# Patient Record
Sex: Female | Born: 1945
Health system: Southern US, Community
[De-identification: ages and names within clinical notes are randomized; demographics above are authoritative.]

## PROBLEM LIST (undated history)

## (undated) DIAGNOSIS — K760 Fatty (change of) liver, not elsewhere classified: Secondary | ICD-10-CM

## (undated) DIAGNOSIS — A0472 Enterocolitis due to Clostridium difficile, not specified as recurrent: Secondary | ICD-10-CM

## (undated) DIAGNOSIS — D126 Benign neoplasm of colon, unspecified: Secondary | ICD-10-CM

## (undated) DIAGNOSIS — A048 Other specified bacterial intestinal infections: Secondary | ICD-10-CM

## (undated) DIAGNOSIS — M1990 Unspecified osteoarthritis, unspecified site: Secondary | ICD-10-CM

## (undated) DIAGNOSIS — F419 Anxiety disorder, unspecified: Secondary | ICD-10-CM

## (undated) DIAGNOSIS — K5790 Diverticulosis of intestine, part unspecified, without perforation or abscess without bleeding: Secondary | ICD-10-CM

## (undated) DIAGNOSIS — F32A Depression, unspecified: Secondary | ICD-10-CM

## (undated) DIAGNOSIS — E785 Hyperlipidemia, unspecified: Secondary | ICD-10-CM

## (undated) DIAGNOSIS — IMO0002 Reserved for concepts with insufficient information to code with codable children: Secondary | ICD-10-CM

## (undated) DIAGNOSIS — D259 Leiomyoma of uterus, unspecified: Secondary | ICD-10-CM

## (undated) DIAGNOSIS — F329 Major depressive disorder, single episode, unspecified: Secondary | ICD-10-CM

## (undated) DIAGNOSIS — K589 Irritable bowel syndrome without diarrhea: Secondary | ICD-10-CM

## (undated) DIAGNOSIS — K449 Diaphragmatic hernia without obstruction or gangrene: Secondary | ICD-10-CM

## (undated) DIAGNOSIS — K219 Gastro-esophageal reflux disease without esophagitis: Secondary | ICD-10-CM

## (undated) DIAGNOSIS — H269 Unspecified cataract: Secondary | ICD-10-CM

## (undated) HISTORY — DX: Hyperlipidemia, unspecified: E78.5

## (undated) HISTORY — PX: APPENDECTOMY: SHX54

## (undated) HISTORY — PX: TONSILLECTOMY: SUR1361

## (undated) HISTORY — PX: CATARACT EXTRACTION, BILATERAL: SHX1313

## (undated) HISTORY — PX: COLONOSCOPY: SHX174

## (undated) HISTORY — DX: Benign neoplasm of colon, unspecified: D12.6

## (undated) HISTORY — DX: Anxiety disorder, unspecified: F41.9

## (undated) HISTORY — DX: Major depressive disorder, single episode, unspecified: F32.9

## (undated) HISTORY — DX: Fatty (change of) liver, not elsewhere classified: K76.0

## (undated) HISTORY — PX: WISDOM TOOTH EXTRACTION: SHX21

## (undated) HISTORY — DX: Reserved for concepts with insufficient information to code with codable children: IMO0002

## (undated) HISTORY — DX: Enterocolitis due to Clostridium difficile, not specified as recurrent: A04.72

## (undated) HISTORY — DX: Diaphragmatic hernia without obstruction or gangrene: K44.9

## (undated) HISTORY — PX: OTHER SURGICAL HISTORY: SHX169

## (undated) HISTORY — DX: Depression, unspecified: F32.A

## (undated) HISTORY — DX: Other specified bacterial intestinal infections: A04.8

## (undated) HISTORY — DX: Unspecified cataract: H26.9

## (undated) HISTORY — DX: Irritable bowel syndrome, unspecified: K58.9

## (undated) HISTORY — DX: Leiomyoma of uterus, unspecified: D25.9

## (undated) HISTORY — DX: Gastro-esophageal reflux disease without esophagitis: K21.9

## (undated) HISTORY — PX: CATARACT EXTRACTION: SUR2

## (undated) HISTORY — PX: CHOLECYSTECTOMY: SHX55

## (undated) HISTORY — DX: Diverticulosis of intestine, part unspecified, without perforation or abscess without bleeding: K57.90

---

## 1986-06-09 DIAGNOSIS — IMO0002 Reserved for concepts with insufficient information to code with codable children: Secondary | ICD-10-CM

## 1986-06-09 HISTORY — DX: Reserved for concepts with insufficient information to code with codable children: IMO0002

## 1998-07-06 ENCOUNTER — Ambulatory Visit (HOSPITAL_COMMUNITY): Admission: RE | Admit: 1998-07-06 | Discharge: 1998-07-06 | Payer: Self-pay | Admitting: *Deleted

## 1998-12-06 ENCOUNTER — Other Ambulatory Visit: Admission: RE | Admit: 1998-12-06 | Discharge: 1998-12-06 | Payer: Self-pay | Admitting: *Deleted

## 1999-02-19 ENCOUNTER — Emergency Department (HOSPITAL_COMMUNITY): Admission: EM | Admit: 1999-02-19 | Discharge: 1999-02-20 | Payer: Self-pay | Admitting: Emergency Medicine

## 1999-07-09 ENCOUNTER — Encounter: Payer: Self-pay | Admitting: *Deleted

## 1999-07-09 ENCOUNTER — Ambulatory Visit (HOSPITAL_COMMUNITY): Admission: RE | Admit: 1999-07-09 | Discharge: 1999-07-09 | Payer: Self-pay | Admitting: *Deleted

## 1999-10-15 ENCOUNTER — Other Ambulatory Visit: Admission: RE | Admit: 1999-10-15 | Discharge: 1999-10-15 | Payer: Self-pay | Admitting: *Deleted

## 2000-07-09 ENCOUNTER — Ambulatory Visit (HOSPITAL_COMMUNITY): Admission: RE | Admit: 2000-07-09 | Discharge: 2000-07-09 | Payer: Self-pay | Admitting: *Deleted

## 2000-07-09 ENCOUNTER — Encounter: Payer: Self-pay | Admitting: *Deleted

## 2008-01-07 ENCOUNTER — Observation Stay (HOSPITAL_COMMUNITY): Admission: EM | Admit: 2008-01-07 | Discharge: 2008-01-10 | Payer: Self-pay | Admitting: Emergency Medicine

## 2008-07-05 ENCOUNTER — Ambulatory Visit: Payer: Self-pay | Admitting: Gastroenterology

## 2008-07-05 DIAGNOSIS — K625 Hemorrhage of anus and rectum: Secondary | ICD-10-CM | POA: Insufficient documentation

## 2008-07-05 DIAGNOSIS — K589 Irritable bowel syndrome without diarrhea: Secondary | ICD-10-CM | POA: Insufficient documentation

## 2008-07-06 LAB — CONVERTED CEMR LAB
Basophils Absolute: 0.1 10*3/uL (ref 0.0–0.1)
Basophils Relative: 0.6 % (ref 0.0–3.0)
Eosinophils Absolute: 0.1 10*3/uL (ref 0.0–0.7)
Eosinophils Relative: 1 % (ref 0.0–5.0)
HCT: 42.4 % (ref 36.0–46.0)
Hemoglobin: 15.1 g/dL — ABNORMAL HIGH (ref 12.0–15.0)
Lymphocytes Relative: 31 % (ref 12.0–46.0)
MCHC: 35.7 g/dL (ref 30.0–36.0)
MCV: 97.2 fL (ref 78.0–100.0)
Monocytes Absolute: 0.3 10*3/uL (ref 0.1–1.0)
Monocytes Relative: 3.7 % (ref 3.0–12.0)
Neutro Abs: 5.9 10*3/uL (ref 1.4–7.7)
Neutrophils Relative %: 63.7 % (ref 43.0–77.0)
Platelets: 311 10*3/uL (ref 150–400)
RBC: 4.36 M/uL (ref 3.87–5.11)
RDW: 12.2 % (ref 11.5–14.6)
WBC: 9.3 10*3/uL (ref 4.5–10.5)

## 2009-09-04 ENCOUNTER — Telehealth: Payer: Self-pay | Admitting: Gastroenterology

## 2009-10-11 ENCOUNTER — Encounter (INDEPENDENT_AMBULATORY_CARE_PROVIDER_SITE_OTHER): Payer: Self-pay | Admitting: *Deleted

## 2009-10-17 ENCOUNTER — Telehealth: Payer: Self-pay | Admitting: Gastroenterology

## 2009-10-25 ENCOUNTER — Encounter (INDEPENDENT_AMBULATORY_CARE_PROVIDER_SITE_OTHER): Payer: Self-pay | Admitting: *Deleted

## 2009-10-29 ENCOUNTER — Ambulatory Visit: Payer: Self-pay | Admitting: Gastroenterology

## 2009-11-06 ENCOUNTER — Ambulatory Visit: Payer: Self-pay | Admitting: Gastroenterology

## 2009-11-09 ENCOUNTER — Encounter: Payer: Self-pay | Admitting: Gastroenterology

## 2009-12-03 ENCOUNTER — Telehealth: Payer: Self-pay | Admitting: Gastroenterology

## 2009-12-24 ENCOUNTER — Telehealth: Payer: Self-pay | Admitting: Gastroenterology

## 2010-02-19 ENCOUNTER — Encounter: Payer: Self-pay | Admitting: Gastroenterology

## 2010-03-06 ENCOUNTER — Encounter: Payer: Self-pay | Admitting: Gastroenterology

## 2010-03-08 ENCOUNTER — Encounter (INDEPENDENT_AMBULATORY_CARE_PROVIDER_SITE_OTHER): Payer: Self-pay | Admitting: *Deleted

## 2010-04-04 ENCOUNTER — Telehealth (INDEPENDENT_AMBULATORY_CARE_PROVIDER_SITE_OTHER): Payer: Self-pay | Admitting: *Deleted

## 2010-04-04 ENCOUNTER — Ambulatory Visit: Payer: Self-pay | Admitting: Gastroenterology

## 2010-04-12 ENCOUNTER — Encounter (INDEPENDENT_AMBULATORY_CARE_PROVIDER_SITE_OTHER): Payer: Self-pay | Admitting: *Deleted

## 2010-04-12 ENCOUNTER — Ambulatory Visit: Payer: Self-pay | Admitting: Gastroenterology

## 2010-04-12 DIAGNOSIS — R112 Nausea with vomiting, unspecified: Secondary | ICD-10-CM | POA: Insufficient documentation

## 2010-04-12 DIAGNOSIS — R197 Diarrhea, unspecified: Secondary | ICD-10-CM | POA: Insufficient documentation

## 2010-04-15 ENCOUNTER — Ambulatory Visit: Payer: Self-pay | Admitting: Gastroenterology

## 2010-04-23 ENCOUNTER — Telehealth: Payer: Self-pay | Admitting: Gastroenterology

## 2010-06-12 ENCOUNTER — Ambulatory Visit: Admit: 2010-06-12 | Payer: Self-pay | Admitting: Gastroenterology

## 2010-07-09 NOTE — Letter (Signed)
Summary: Pre Visit Letter Revised  [facility]  [address] [place], [place] [address]   Phone: [phone]  Fax: [phone]        03/08/2010 MRN: [id number] Tara Davis [address] [place], [place]  [address]             Procedure Date:  04-17-10   Welcome to the Gastroenterology Division at [facility].    You are scheduled to see a nurse for your pre-procedure visit on 04-03-10 at 4:00p.m. on the 3rd floor at [facility], [address].  We ask that you try to arrive at our office 15 minutes prior to your appointment time to allow for check-in.  Please take a minute to review the attached form.  If you answer "Yes" to one or more of the questions on the first page, we ask that you call the person listed at your earliest opportunity.  If you answer "No" to all of the questions, please complete the rest of the form and bring it to your appointment.    Your nurse visit will consist of discussing your medical and surgical history, your immediate family medical history, and your medications.   If you are unable to list all of your medications on the form, please bring the medication bottles to your appointment and we will list them.  We will need to be aware of both prescribed and over the counter drugs.  We will need to know exact dosage information as well.    Please be prepared to read and sign documents such as consent forms, a financial agreement, and acknowledgement forms.  If necessary, and with your consent, a friend or relative is welcome to sit-in on the nurse visit with you.  Please bring your insurance card so that we may make a copy of it.  If your insurance requires a referral to see a specialist, please bring your referral form from your primary care physician.  No co-pay is required for this nurse visit.     If you cannot keep your appointment, please call [phone] to cancel or reschedule prior to your appointment date.  This allows [place]  the opportunity to schedule an appointment for another patient in need of care.    Thank you for choosing [facility] for your medical needs.  We appreciate the opportunity to care for you.  Please visit [place] at our website  to learn more about our practice.  Sincerely, The Gastroenterology Division

## 2010-07-09 NOTE — Progress Notes (Signed)
Summary: Appt sooner  Phone Note Call from Patient Call back at Home Phone [phone]   Call For: Dr Christella Hartigan Summary of Call: Nausea vomitting & indigestion, dysphagia recuring diarrhea. Can she be worked in sooner than next avail 05-14-10 Initial call taken by: Ezra Sites RN,  April 04, 2010 5:07 PM  Follow-up for Phone Call        patty, can you find a spot sooner on my schedule (double book next week if needed) Follow-up by: Rachael Fee MD,  April 05, 2010 7:55 AM  Additional Follow-up for Phone Call Additional follow up Details #1::        left message on machine to call back Chales Abrahams CMA Duncan Dull)  April 05, 2010 8:34 AM     Additional Follow-up for Phone Call Additional follow up Details #2::    Appt scheduled and pt aware Follow-up by: Chales Abrahams CMA Duncan Dull),  April 05, 2010 10:42 AM

## 2010-07-09 NOTE — Progress Notes (Signed)
Summary: Triage  Phone Note Call from Patient Call back at [phone]   Caller: Patient Call For: Dr. Christella Hartigan Reason for Call: Talk to Nurse Summary of Call: pt is having cramping and diarrhea. She is taking the fiber he recommended. Initial call taken by: Karna Christmas,  December 03, 2009 4:17 PM  Follow-up for Phone Call        has been taking Citrucel for 1 month and is still having 3-5 loose stools with cramping every morning and occasionally after eating.  She was advised to take 1 immodium every morning and will call back in a week to let us know how she responds.  She was taking Immodium with good relief but was afraid to take it everyday.  I advised her to stop only if she becomes constipated. Follow-up by: Chales Abrahams CMA Duncan Dull),  December 03, 2009 4:33 PM  Additional Follow-up for Phone Call Additional follow up Details #1::        ok, thanks Additional Follow-up by: Rachael Fee MD,  December 04, 2009 7:41 AM

## 2010-07-09 NOTE — Letter (Signed)
Summary: [facility] Instructions  [facility]  [address] [place], [place] [address]   Phone: [phone]  Fax: [phone]       Tara Davis    09-Jun-1946    MRN: [id number]        Procedure Day /Date:05/07/10 TUE     Arrival Time:730 am     Procedure Time:800 am     Location of Procedure:                    X  [facility] (4th Floor)   PREPARATION FOR COLONOSCOPY WITH MOVIPREP   Starting 5 days prior to your procedure 05/02/10 do not eat nuts, seeds, popcorn, corn, beans, peas,  salads, or any raw vegetables.  Do not take any fiber supplements (e.g. Metamucil, Citrucel, and Benefiber).  THE DAY BEFORE YOUR PROCEDURE         DATE: 05/06/10 DAY: MON  1.  Drink clear liquids the entire day-NO SOLID FOOD  2.  Do not drink anything colored red or purple.  Avoid juices with pulp.  No orange juice.  3.  Drink at least 64 oz. (8 glasses) of fluid/clear liquids during the day to prevent dehydration and help the prep work efficiently.  CLEAR LIQUIDS INCLUDE: Water Jello Ice Popsicles Tea (sugar ok, no milk/cream) Powdered fruit flavored drinks Coffee (sugar ok, no milk/cream) Gatorade Juice: apple, white grape, white cranberry  Lemonade Clear bullion, consomm, broth Carbonated beverages (any kind) Strained chicken noodle soup Hard Candy                             4.  In the morning, mix first dose of MoviPrep solution:    Empty 1 Pouch A and 1 Pouch B into the disposable container    Add lukewarm drinking water to the top line of the container. Mix to dissolve    Refrigerate (mixed solution should be used within 24 hrs)  5.  Begin drinking the prep at 5:00 p.m. The MoviPrep container is divided by 4 marks.   Every 15 minutes drink the solution down to the next mark (approximately 8 oz) until the full liter is complete.   6.  Follow completed prep with 16 oz of clear liquid of your choice (Nothing red or purple).  Continue to drink  clear liquids until bedtime.  7.  Before going to bed, mix second dose of MoviPrep solution:    Empty 1 Pouch A and 1 Pouch B into the disposable container    Add lukewarm drinking water to the top line of the container. Mix to dissolve    Refrigerate  THE DAY OF YOUR PROCEDURE      DATE: 05/07/10 DAY: TUE  Beginning at 3 a.m. (5 hours before procedure):         1. Every 15 minutes, drink the solution down to the next mark (approx 8 oz) until the full liter is complete.  2. Follow completed prep with 16 oz. of clear liquid of your choice.    3. You may drink clear liquids until 6 am (2 HOURS BEFORE PROCEDURE).   MEDICATION INSTRUCTIONS  Unless otherwise instructed, you should take regular prescription medications with a small sip of water   as early as possible the morning of your procedure.           OTHER INSTRUCTIONS  You will need a responsible adult at least 64 years of  age to accompany you and drive you home.   This person must remain in the waiting room during your procedure.  Wear loose fitting clothing that is easily removed.  Leave jewelry and other valuables at home.  However, you may wish to bring a book to read or  an iPod/MP3 player to listen to music as you wait for your procedure to start.  Remove all body piercing jewelry and leave at home.  Total time from sign-in until discharge is approximately 2-3 hours.  You should go home directly after your procedure and rest.  You can resume normal activities the  day after your procedure.  The day of your procedure you should not:   Drive   Make legal decisions   Operate machinery   Drink alcohol   Return to work  You will receive specific instructions about eating, activities and medications before you leave.    The above instructions have been reviewed and explained to me by   _______________________    I fully understand and can verbalize these instructions _____________________________  Date _________  Appended Document: Moviprep Instructions pt appt time changed to 04/15/10  pt aware and reinstructed

## 2010-07-09 NOTE — Miscellaneous (Signed)
Summary: rx  Clinical Lists Changes  Medications: Rx of NEXIUM 40 MG  CPDR (ESOMEPRAZOLE MAGNESIUM) 1 capsule each day 30 minutes before breakfast and dinner meals;  #60 x 6;  Signed;  Entered by: Rachael Fee MD;  Authorized by: Rachael Fee MD;  Method used: Electronically to [facility]  [phone]*, [address] [place], [place], [place]  [address], Ph: [phone] or [phone], Fax: [phone]    Prescriptions: NEXIUM 40 MG  CPDR (ESOMEPRAZOLE MAGNESIUM) 1 capsule each day 30 minutes before breakfast and dinner meals  #60 x 6   Entered and Authorized by:   Rachael Fee MD   Signed by:   Rachael Fee MD on 04/15/2010   Method used:   Electronically to        [facility]  [phone]* (retail)       [address]       [place], [place]  [address]       Ph: [phone] or [phone]       Fax: [phone]   RxID:   [id number]

## 2010-07-09 NOTE — Progress Notes (Signed)
Summary: nexium   Phone Note Call from Patient Call back at Home Phone [phone]   Caller: Patient Call For: Dr. Christella Hartigan Reason for Call: Talk to Nurse Summary of Call: . having a problem refilling Nexium with current dosage... [facility] on [address] Initial call taken by: Vallarie Mare,  April 23, 2010 8:14 AM  Follow-up for Phone Call        I spoke with the pt and she was advised to have the pharmacy send me a prior auth and I could send that in for her she will take care of that today Follow-up by: Chales Abrahams CMA Duncan Dull),  April 23, 2010 9:33 AM

## 2010-07-09 NOTE — Progress Notes (Signed)
Summary: vomitting & diarrhea  Phone Note Call from Patient Call back at [phone] cell   Call For: Dr Christella Hartigan Summary of Call: Struggling with vomitting and diarrhea Initial call taken by: Leanor Kail [employer],  December 24, 2009 1:26 PM  Follow-up for Phone Call         complains that she still has diarrhea but has not taken her Immodium.  It does work when she takes it but she says she doesnt like to take it everyday.  I advised her to to take the Immodium as Dr Christella Hartigan advised.  She said she would take it. pt had to take Z pak for bronchial infection had some vomitting last two days.  She has finished the Z pak.  I advised her to stay on clear liquids until the vomitting subsided and I will forward to Dr Christella Hartigan. Follow-up by: Chales Abrahams CMA Duncan Dull),  December 24, 2009 1:57 PM  Additional Follow-up for Phone Call Additional follow up Details #1::        i agree, if the immodium works...she should take it Additional Follow-up by: Rachael Fee MD,  December 24, 2009 3:26 PM

## 2010-07-09 NOTE — Procedures (Signed)
Summary: Upper Endoscopy  Patient: [name] Note: All result statuses are Final unless otherwise noted.  Tests: (1) Upper Endoscopy (EGD)   EGD Upper Endoscopy       DONE     [facility]     [address]     [place], [place]  [address]           ENDOSCOPY PROCEDURE REPORT           PATIENT:  Tara Davis, Tara Davis  MR#:  [id number]     BIRTHDATE:  March 29, 1946, 64 yrs. old  GENDER:  female     ENDOSCOPIST:  Rachael Fee, MD     PROCEDURE DATE:  04/15/2010     PROCEDURE:  EGD with biopsy, [address]     ASA CLASS:  Class II     INDICATIONS:  nausea, itermittent vomiting     MEDICATIONS:  There was residual sedation effect present from     prior procedure., Versed 2 mg IV     TOPICAL ANESTHETIC:  Exactacain Spray           DESCRIPTION OF PROCEDURE:   After the risks benefits and     alternatives of the procedure were thoroughly explained, informed     consent was obtained.  The LB GIF-H180 [id number] endoscope was     introduced through the mouth and advanced to the second portion of     the duodenum, without limitations.  The instrument was slowly     withdrawn as the mucosa was fully examined.     <<PROCEDUREIMAGES>>     There was moderate appearing, non-specific gastritis. Biopsies     taken to check for H. pylori (see image7).  A hiatal hernia was     found. This was 3-4cm (see image6 and image8).  The distal     esophagus mucosa was abnormal. Severely inflamed, somewhat     edematous. There was no clearly neoplastic appearing mucosa,     however biopsies were taken to be certain.  Likely this is reflux     related (see image2 and image1).  Otherwise the examination was     normal (see image3, image4, and image5).    Retroflexed views     revealed no abnormalities.    The scope was then withdrawn from     the patient and the procedure completed.           COMPLICATIONS:  None           ENDOSCOPIC IMPRESSION:     1) Moderate appearing gastritis, biopsied to check for H.  pylori           2) 3-4cm hiatal hernia     3) Abnormal esophageal mucosa; appears reflux related but     biopsies taken to check for neoplasia     4) Otherwise normal examination           RECOMMENDATIONS:     A prescription for nexium was called in to your pharmacy. Start     taking one pill twice daily for the next two months, then OK to     change to once daily.     If biopsies show H. pylori, then you will be started on the     appropriate antibiotics.  NSAIDs (ibuprofen) can cause similar     gastritis changes.           ______________________________     Rachael Fee, MD  n.     eSIGNED:   Rachael Fee at 04/15/2010 03:22 PM           [place] [place], [id number]  Note: An exclamation mark (!) indicates a result that was not dispersed into the flowsheet. Document Creation Date: 04/15/2010 3:22 PM _______________________________________________________________________  (1) Order result status: Final Collection or observation date-time: 04/15/2010 15:11 Requested date-time:  Receipt date-time:  Reported date-time:  Referring Physician:   Ordering Physician: Rob Bunting [phone]) Specimen Source:  Source: [employer] Order Number: [id number] Lab site:

## 2010-07-09 NOTE — Procedures (Signed)
Summary: Colonoscopy  Patient: [name] Note: All result statuses are Final unless otherwise noted.  Tests: (1) Colonoscopy (COL)   COL Colonoscopy           DONE     [facility]     [address]     [place], [place]  [address]           COLONOSCOPY PROCEDURE REPORT           PATIENT:  Tara Davis, Tara Davis  MR#:  [id number]     BIRTHDATE:  11-13-1945, 63 yrs. old  GENDER:  female     ENDOSCOPIST:  Rachael Fee, MD     PROCEDURE DATE:  11/06/2009     PROCEDURE:  Colonoscopy with snare polypectomy     ASA CLASS:  Class II     INDICATIONS:  rectal bleeding, minor     MEDICATIONS:   Fentanyl 50 mcg IV, Versed 7 mg IV, Benadryl 25 mg     IV           DESCRIPTION OF PROCEDURE:   After the risks benefits and     alternatives of the procedure were thoroughly explained, informed     consent was obtained.  Digital rectal exam was performed and     revealed no rectal masses.   The LB CF-H180AL [id number] endoscope     was introduced through the anus and advanced to the cecum, which     was identified by both the appendix and ileocecal valve, without     limitations.  The quality of the prep was good, using MoviPrep.     The instrument was then slowly withdrawn as the colon was fully     examined.     <<PROCEDUREIMAGES>>           FINDINGS:  There were six sessile polyps. All were removed, 4 of     them were retrieved and sent to pathology. These ranged in size     from 5mm to 15mm and they were located in asending and descending     colon. Four of them required snare/cautery and the others cold     snare (see image4, image7, image8, image9, image10, and image13).     The largest polyp, 15mm, was in ascending colon and it required     piecemeal resection.  External hemorrhoids were found.  Mild     diverticulosis was found in the sigmoid to descending colon     segments.  This was otherwise a normal examination of the colon     (see image14, image1, and image2).   Retroflexed  views in the     rectum revealed no abnormalities.    The scope was then withdrawn     from the patient and the procedure completed.           COMPLICATIONS:  None     ENDOSCOPIC IMPRESSION:     1) Six sessile polyps, all removed; Four were retrieved and sent     to patholog.  One required piecemeal resection.     2) External hemorrhoids     3) Mild diverticulosis in the sigmoid to descending colon     segments     4) Otherwise normal examination           RECOMMENDATIONS:     1) If the polyp(s) removed today are proven to be adenomatous     (pre-cancerous) polyps, you will need a colonoscopy in 6 months  given piecemeal resection of largest polyp.     2) You will receive a letter within 1-2 weeks with the results     of your biopsy as well as final recommendations. Please call my     office if you have not received a letter after 3 weeks.           ______________________________     Rachael Fee, MD           n.     eSIGNED:   Rachael Fee at 11/06/2009 02:45 PM           [facility], [id number]  Note: An exclamation mark (!) indicates a result that was not dispersed into the flowsheet. Document Creation Date: 11/06/2009 2:46 PM _______________________________________________________________________  (1) Order result status: Final Collection or observation date-time: 11/06/2009 14:38 Requested date-time:  Receipt date-time:  Reported date-time:  Referring Physician:   Ordering Physician: Rob Bunting [phone]) Specimen Source:  Source: Launa Grill Order Number: [id number] Lab site:   Appended Document: Colonoscopy recall     Procedures Next Due Date:    Colonoscopy: 04/2010

## 2010-07-09 NOTE — Letter (Signed)
Summary: Previsit letter  [facility]  [address] [place], [place] [address]   Phone: [phone]  Fax: [phone]       10/11/2009 MRN: [id number]  Tara Davis [address] [place], [place]  [address]  Dear Ms. [name],  Welcome to the Gastroenterology Division at [facility].    You are scheduled to see a nurse for your pre-procedure visit on 10-29-09 at 4:30p.m. on the 3rd floor at [facility], [address]  We ask that you try to arrive at our office 15 minutes prior to your appointment time to allow for check-in.  Your nurse visit will consist of discussing your medical and surgical history, your immediate family medical history, and your medications.    Please bring a complete list of all your medications or, if you prefer, bring the medication bottles and we will list them.  We will need to be aware of both prescribed and over the counter drugs.  We will need to know exact dosage information as well.  If you are on blood thinners (Coumadin, Plavix, Aggrenox, Ticlid, etc.) please call our office today/prior to your appointment, as we need to consult with your physician about holding your medication.   Please be prepared to read and sign documents such as consent forms, a financial agreement, and acknowledgement forms.  If necessary, and with your consent, a friend or relative is welcome to sit-in on the nurse visit with you.  Please bring your insurance card so that we may make a copy of it.  If your insurance requires a referral to see a specialist, please bring your referral form from your primary care physician.  No co-pay is required for this nurse visit.     If you cannot keep your appointment, please call [phone] to cancel or reschedule prior to your appointment date.  This allows [place] the opportunity to schedule an appointment for another patient in need of care.    Thank you for choosing [facility] for your medical needs.   We appreciate the opportunity to care for you.  Please visit [place] at our website  to learn more about our practice.                     Sincerely.                                                                                                                   The Gastroenterology Division

## 2010-07-09 NOTE — Assessment & Plan Note (Signed)
Review of gastrointestinal problems: 1. Several adenomatous polyps 10/2009 colonoscopy (also diverticulosis, hemorhoids), largest piecemeal resected. recall at 6 months.    History of Present Illness Visit Type: Follow-up Visit Primary GI MD: Rob Bunting MD Primary Provider: Kennedy Bucker Chief Complaint: nausea, vomiting History of Present Illness:     very pleasant 65 year old woman whom I last saw at the time of a colonoscopy about 5 months ago. See those results summarized above  every day she has loose stools in the AM, lately associated with nausea/vomiting as well.  She gets flushing, sweating at those times.    gallbladder removed 12-13 years ago.  Has been taking immodium 1-2 a day every day, not really helping with the loose stools.            Current Medications (verified): 1)  Lexapro 20 Mg Tabs (Escitalopram Oxalate) .Marland Kitchen.. 1 By Mouth Once Daily 2)  Lorazepam 1 Mg Tabs (Lorazepam) .... As Needed 3)  Simvastatin 40 Mg Tabs (Simvastatin) .Marland Kitchen.. 1 By Mouth At Bedtime 4)  Imodium A-D 2 Mg Tabs (Loperamide Hcl) .Marland Kitchen.. 1-2 By Mouth Once Daily As Needed 5)  Omeprazole Magnesium 20.6 (20 Base) Mg Cpdr (Omeprazole Magnesium) .... 3-4 Times A Week As Needed 6)  Ibuprofen 800 Mg Tabs (Ibuprofen) .... As Needed  Allergies (verified): 1)  ! Codeine  Vital Signs:  Patient profile:   65 year old female Height:      64 inches Weight:      187 pounds BMI:     32.21 BSA:     1.90 Pulse rate:   78 / minute Pulse rhythm:   regular BP sitting:   120 / 78  (left arm)  Vitals Entered By: Merri Ray CMA Duncan Dull) (April 12, 2010 8:13 AM)  Physical Exam  Additional Exam:  Constitutional: generally well appearing Psychiatric: alert and oriented times 3 Abdomen: soft, non-tender, non-distended, normal bowel sounds    Impression & Recommendations:  Problem # 1:  chronic loose stools, intermittent nausea, vomiting possibly bile salt dysregulation from her  gallbladder surgery. Perhaps microscopic colitis. She is due for recall colonoscopy in the next few weeks at the same time I will proceed with random biopsies of her colon. She will try cholestyramine powder one dose nightly until then. She takes 4 ibuprofen about 3-4 times a week and perhaps this is causing some gastric irritation, peptic ulcer disease. We will proceed with EGD at the same time as her colonoscopy given her nausea, intermittent vomiting  Patient Instructions: 1)  You will be scheduled to have a colonoscopy, will plan to perform random biopsies at that time as well as reinspect the previous polyp sites. 2)  You will be scheduled to have an upper endoscopy at the same time as colonoscopy. 3)  Stop the immodium. 4)  Trial of cholestyramine 4gms at bedtime, only cut back if you become constipated. 5)  The medication list was reviewed and reconciled.  All changed / newly prescribed medications were explained.  A complete medication list was provided to the patient / caregiver. Prescriptions: CHOLESTYRAMINE   POWD (CHOLESTYRAMINE) take 4grams once nightly  #1 month x 3   Entered and Authorized by:   Rachael Fee MD   Signed by:   Rachael Fee MD on 04/12/2010   Method used:   Electronically to        [facility]  [phone]* (retail)       [phone] [facility] [address]  [place], [place]  [address]       Ph: [phone] or [phone]       Fax: [phone]   RxID:   [id number]   Appended Document:  can you send to her PCP, she is not in biscom  Appended Document: Orders Update/Movi    Clinical Lists Changes  Problems: Added new problem of DIARRHEA (ICD-787.91) Added new problem of NAUSEA WITH VOMITING (ICD-787.01) Medications: Added new medication of MOVIPREP 100 GM  SOLR (PEG-KCL-NACL-NASULF-NA ASC-C) As per prep instructions. - Signed Rx of MOVIPREP 100 GM  SOLR (PEG-KCL-NACL-NASULF-NA ASC-C) As per prep instructions.;  #1 x 0;   Signed;  Entered by: Chales Abrahams CMA (AAMA);  Authorized by: Rachael Fee MD;  Method used: Electronically to [facility]  [phone]*, [address] [place], [place], [place]  [address], Ph: [phone] or [phone], Fax: [phone] Orders: Added new Test order of Colon/Endo (Colon/Endo) - Signed    Prescriptions: MOVIPREP 100 GM  SOLR (PEG-KCL-NACL-NASULF-NA ASC-C) As per prep instructions.  #1 x 0   Entered by:   Chales Abrahams CMA (AAMA)   Authorized by:   Rachael Fee MD   Signed by:   Chales Abrahams CMA (AAMA) on 04/12/2010   Method used:   Electronically to        [facility]  [phone]* (retail)       [address]       [place], [place]  [address]       Ph: [phone] or [phone]       Fax: [phone]   RxID:   [id number]    Appended Document:  sent to transcription

## 2010-07-09 NOTE — Miscellaneous (Signed)
Summary: [facility] PV- need OV  Clinical Lists Changes   Pt complaining of nausea, vomiting at least once a week. Indigestion and dysphagia are new symptoms.  Also has diarrhea that has been going on since July.  Is scheduled for recall colon 11/9- (6 month recall).  Pt would like to be seen in office for above symptoms.  Colonoscopy for 11/9 cancelled.  First available office visit is early December.  Note sent to Patty to see if pt can be seen sooner.

## 2010-07-09 NOTE — Letter (Signed)
Summary: [facility] Instructions  [facility]  [address] [place], [place] [address]   Phone: [phone]  Fax: [phone]       Tara Davis    1946/04/29    MRN: [id number]        Procedure Day /Date:  11/06/09  Tuesday     Arrival Time:  1:30pm      Procedure Time:  2:30pm     Location of Procedure:                    _ x_  [facility] (4th Floor)                        PREPARATION FOR COLONOSCOPY WITH MOVIPREP   Starting 5 days prior to your procedure  11/01/09 do not eat nuts, seeds, popcorn, corn, beans, peas,  salads, or any raw vegetables.  Do not take any fiber supplements (e.g. Metamucil, Citrucel, and Benefiber).  THE DAY BEFORE YOUR PROCEDURE         DATE:   11/05/09  DAY:  Monday  1.  Drink clear liquids the entire day-NO SOLID FOOD  2.  Do not drink anything colored red or purple.  Avoid juices with pulp.  No orange juice.  3.  Drink at least 64 oz. (8 glasses) of fluid/clear liquids during the day to prevent dehydration and help the prep work efficiently.  CLEAR LIQUIDS INCLUDE: Water Jello Ice Popsicles Tea (sugar ok, no milk/cream) Powdered fruit flavored drinks Coffee (sugar ok, no milk/cream) Gatorade Juice: apple, white grape, white cranberry  Lemonade Clear bullion, consomm, broth Carbonated beverages (any kind) Strained chicken noodle soup Hard Candy                             4.  In the morning, mix first dose of MoviPrep solution:    Empty 1 Pouch A and 1 Pouch B into the disposable container    Add lukewarm drinking water to the top line of the container. Mix to dissolve    Refrigerate (mixed solution should be used within 24 hrs)  5.  Begin drinking the prep at 5:00 p.m. The MoviPrep container is divided by 4 marks.   Every 15 minutes drink the solution down to the next mark (approximately 8 oz) until the full liter is complete.   6.  Follow completed prep with 16 oz of clear liquid of your choice  (Nothing red or purple).  Continue to drink clear liquids until bedtime.  7.  Before going to bed, mix second dose of MoviPrep solution:    Empty 1 Pouch A and 1 Pouch B into the disposable container    Add lukewarm drinking water to the top line of the container. Mix to dissolve    Refrigerate  THE DAY OF YOUR PROCEDURE      DATE:   11/06/09  DAY:   Tuesday  Beginning at 9:30a.m. (5 hours before procedure):         1. Every 15 minutes, drink the solution down to the next mark (approx 8 oz) until the full liter is complete.  2. Follow completed prep with 16 oz. of clear liquid of your choice.    3. You may drink clear liquids until 12:30pm  (2 HOURS BEFORE PROCEDURE).   MEDICATION INSTRUCTIONS  Unless otherwise instructed, you should take regular prescription medications with a small sip  of water   as early as possible the morning of your procedure.           OTHER INSTRUCTIONS  You will need a responsible adult at least 64 years of age to accompany you and drive you home.   This person must remain in the waiting room during your procedure.  Wear loose fitting clothing that is easily removed.  Leave jewelry and other valuables at home.  However, you may wish to bring a book to read or  an iPod/MP3 player to listen to music as you wait for your procedure to start.  Remove all body piercing jewelry and leave at home.  Total time from sign-in until discharge is approximately 2-3 hours.  You should go home directly after your procedure and rest.  You can resume normal activities the  day after your procedure.  The day of your procedure you should not:   Drive   Make legal decisions   Operate machinery   Drink alcohol   Return to work  You will receive specific instructions about eating, activities and medications before you leave.    The above instructions have been reviewed and explained to me by   Wyona Almas RN  Oct 29, 2009 5:20 PM     I fully  understand and can verbalize these instructions _____________________________ Date _________

## 2010-07-09 NOTE — Letter (Signed)
Summary: Results Letter  [facility]  [address] [place], [place] [address]   Phone: [phone]  Fax: [phone]        November 09, 2009 MRN: [id number]    Tara Davis [address] [place], [place]  [address]    Dear Tara Davis,   The polyp(s) removed during your recent procedure were proven to be adenomatous.  These are pre-cancerous polyps that may have grown into cancers if they had not been removed.  Based on current nationally recognized surveillance guidelines, I recommend that you have a repeat colonoscopy in 6 months.   We will therefore put your information in our reminder system and will contact you in 6 months to schedule a repeat procedure.  Please call if you have any questions or concerns.       Sincerely,  Rachael Fee MD  This letter has been electronically signed by your physician.  Appended Document: Results Letter letter mailed.

## 2010-07-09 NOTE — Progress Notes (Signed)
Summary: ? re procedure  Phone Note Call from Patient Call back at [phone]   Caller: Patient Call For: Christella Hartigan Reason for Call: Acute Illness, Talk to Nurse Summary of Call: Patient wants to reschedule a colon that she was scheduled for last year, wants to know if she needs to see Dr Christella Hartigan in the office again or can she just schedule the colonoscopy. Initial call taken by: Tawni Levy,  September 04, 2009 11:56 AM  Follow-up for Phone Call        Message left for pt. to call back to see if she has had any new health problems or new meds.   Teryl Lucy RN  September 04, 2009 12:15 PM  Pt. has not had any new health problems and is not on any new meds. Follow-up by: Teryl Lucy RN,  September 04, 2009 4:36 PM  Additional Follow-up for Phone Call Additional follow up Details #1::        ok to schedule colonoscopy without office visit Additional Follow-up by: Rachael Fee MD,  September 04, 2009 9:32 PM    Additional Follow-up for Phone Call Additional follow up Details #2::    pt aware she may schedule direct colon,  she will call and schedule Follow-up by: Chales Abrahams CMA Duncan Dull),  September 05, 2009 8:19 AM

## 2010-07-09 NOTE — Progress Notes (Signed)
Summary: Move Colon sooner  Phone Note Call from Patient Call back at [phone] cell   Call For: Dr Christella Hartigan Summary of Call: Scheduled for a Colon 11-09-09 previsit  10-29-09 but is in so much pain wonders if she can have this moved up sooner? Initial call taken by: Leanor Kail [facility],  Oct 17, 2009 2:12 PM  Follow-up for Phone Call        appt rescheduled to 11/06/09.  pt aware Follow-up by: Chales Abrahams CMA Duncan Dull),  Oct 17, 2009 2:22 PM

## 2010-07-09 NOTE — Procedures (Signed)
Summary: Colonoscopy  Patient: Tara Davis Note: All result statuses are Final unless otherwise noted.  Tests: (1) Colonoscopy (COL)   COL Colonoscopy           DONE     [facility]     [address]     [place], [place]  [address]           COLONOSCOPY PROCEDURE REPORT           PATIENT:  [name], [name]  MR#:  [id number]     BIRTHDATE:  [birth year], 64 yrs. old  GENDER:  female     ENDOSCOPIST:  Rachael Fee, MD     PROCEDURE DATE:  04/15/2010     PROCEDURE:  Colonoscopy with snare polypectomy     ASA CLASS:  Class II     INDICATIONS:  adenomatous polyps resected 6 months ago (one     piecemeal)     MEDICATIONS:   Fentanyl 100 mcg IV, Versed 7 mg IV           DESCRIPTION OF PROCEDURE:   After the risks benefits and     alternatives of the procedure were thoroughly explained, informed     consent was obtained.  Digital rectal exam was performed and     revealed no rectal masses.   The LB [id number]-[id number] endoscope     was introduced through the anus and advanced to the cecum, which     was identified by both the appendix and ileocecal valve, without     limitations.  The quality of the prep was good, using MoviPrep.     The instrument was then slowly withdrawn as the colon was fully     examined.     <<PROCEDUREIMAGES>>     FINDINGS:  Three small sessile polyps were found, all were removed     with cold snare and sent to pathology (jar 1). These ranged in     size from 2mm to 4mm, located in asending and transverse segments     (see image5).  Mild diverticulosis was found in the sigmoid to     descending colon segments (see image1).  This was otherwise a     normal examination of the colon (see image4, image6, and image2).     Retroflexed views in the rectum revealed no abnormalities.    The     scope was then withdrawn from the patient and the procedure     completed.     COMPLICATIONS:  None     ENDOSCOPIC IMPRESSION:     1) Three small polyps, all removed  and all sent to pathology     2) Mild diverticulosis in the sigmoid to descending colon     segments     3) Otherwise normal examination           RECOMMENDATIONS:     1) Given your personal history of adenomatous polyps, you should     have a repeat colonoscopy in 3-5 years (await pathology report)     2) You will receive a letter within 1-2 weeks with the results     of your biopsy as well as final recommendations. Please call my     office if you have not received a letter after 3 weeks.           ______________________________     Rachael Fee, MD           n.  eSIGNED:   Rachael Fee at 04/15/2010 03:13 PM           [facility], [id number]  Note: An exclamation mark (!) indicates a result that was not dispersed into the flowsheet. Document Creation Date: 04/15/2010 3:13 PM _______________________________________________________________________  (1) Order result status: Final Collection or observation date-time: 04/15/2010 15:00 Requested date-time:  Receipt date-time:  Reported date-time:  Referring Physician:   Ordering Physician: Rob Bunting [phone]) Specimen Source:  Source: Launa Grill Order Number: [id number] Lab site:   Appended Document: Colonoscopy     Procedures Next Due Date:    Colonoscopy: 04/2015

## 2010-07-09 NOTE — Letter (Signed)
Summary: Colonoscopy Letter  [facility]  [address] [place], [place] [address]   Phone: [phone]  Fax: [phone]      March 06, 2010 MRN: [id number]   Tara Davis [address] [place], [place]  [address]   Dear Ms. HIGINBOTHAM,   According to your medical record, it is time for you to schedule a Colonoscopy. The American Cancer Society recommends this procedure as a method to detect early colon cancer. Patients with a family history of colon cancer, or a personal history of colon polyps or inflammatory bowel disease are at increased risk.  This letter has been generated based on the recommendations made at the time of your procedure. If you feel that in your particular situation this may no longer apply, please contact our office.  Please call our office at [phone] to schedule this appointment or to update your records at your earliest convenience.  Thank you for cooperating with [place] to provide you with the very best care possible.   Sincerely,  Rachael Fee, M.D.  [facility]astroenterology Division [phone]

## 2010-07-09 NOTE — Miscellaneous (Signed)
Summary: [facility] Previsit/prep  Clinical Lists Changes  Medications: Added new medication of MOVIPREP 100 GM  SOLR (PEG-KCL-NACL-NASULF-NA ASC-C) As per prep instructions. - Signed Rx of MOVIPREP 100 GM  SOLR (PEG-KCL-NACL-NASULF-NA ASC-C) As per prep instructions.;  #1 x 0;  Signed;  Entered by: Wyona Almas RN;  Authorized by: Rachael Fee MD;  Method used: Electronically to [facility]  [phone]*, [address] [place], [place], [place]  [address], Ph: [phone] or [phone], Fax: [phone] Allergies: Changed allergy or adverse reaction from CODEINE to CODEINE    Prescriptions: MOVIPREP 100 GM  SOLR (PEG-KCL-NACL-NASULF-NA ASC-C) As per prep instructions.  #1 x 0   Entered by:   Wyona Almas RN   Authorized by:   Rachael Fee MD   Signed by:   Wyona Almas RN on 10/29/2009   Method used:   Electronically to        [facility]  [phone]* (retail)       [address]       [place], [place]  [address]       Ph: [phone] or [phone]       Fax: [phone]   RxID:   [id number]

## 2010-07-09 NOTE — Procedures (Signed)
Summary: Recall Assessment/[facility]  Recall Assessment/[facility]   Imported By: Sherian Rein 03/08/2010 12:03:52  _____________________________________________________________________  External Attachment:    Type:   Image     Comment:   External Document

## 2010-07-15 ENCOUNTER — Encounter: Payer: Self-pay | Admitting: Gastroenterology

## 2010-07-15 ENCOUNTER — Ambulatory Visit (INDEPENDENT_AMBULATORY_CARE_PROVIDER_SITE_OTHER): Payer: 59 | Admitting: Gastroenterology

## 2010-07-15 DIAGNOSIS — K2990 Gastroduodenitis, unspecified, without bleeding: Secondary | ICD-10-CM

## 2010-07-15 DIAGNOSIS — Z860100 Personal history of colon polyps, unspecified: Secondary | ICD-10-CM

## 2010-07-15 DIAGNOSIS — K2970 Gastritis, unspecified, without bleeding: Secondary | ICD-10-CM

## 2010-07-25 NOTE — Assessment & Plan Note (Signed)
  Review of gastrointestinal problems: 1. Several adenomatous polyps 10/2009 colonoscopy (also diverticulosis, hemorhoids), largest piecemeal resected. recall at 6 months.  repeat colonoscopy 04/2010 found 3 small adenomas; recall at 5 year interval. 2. H. pylori gasttrits 04/2010; symptoms of nausea/vomitting all improved with abx.   Vital Signs:  Patient profile:   65 year old female Height:      64 inches Weight:      192.50 pounds BMI:     33.16 Pulse rate:   76 / minute Pulse rhythm:   regular BP sitting:   110 / 70  (left arm) Cuff size:   regular  Vitals Entered By: June McMurray CMA Duncan Dull) (July 15, 2010 9:45 AM)  History of Present Illness Visit Type: Follow-up Visit Primary GI MD: Rob Bunting MD Primary Provider: Kennedy Bucker Requesting Provider: Allena Earing, MD Chief Complaint: Loose stools History of Present Illness:       very pleasant 65 year old woman whom I last saw about 2 months ago at the time of upper and lower endoscopy. The those results summarized above.  her nausea is completely gone.  she completed antibiotics.  she stopped cholestryamine powder, thinking that it would help UGI issues not constipation as I had given it to her for.          Current Medications (verified): 1)  Lexapro 20 Mg Tabs (Escitalopram Oxalate) .Marland Kitchen.. 1 By Mouth Once Daily 2)  Lorazepam 1 Mg Tabs (Lorazepam) .... As Needed 3)  Simvastatin 40 Mg Tabs (Simvastatin) .Marland Kitchen.. 1 By Mouth At Bedtime 4)  Extra Strength Acetaminophen 500 Mg Caps (Acetaminophen) .... As Needed For Pain 5)  Cholestyramine   Powd (Cholestyramine) .... Take 4grams Once Nightly 6)  Nexium 40 Mg  Cpdr (Esomeprazole Magnesium) .Marland Kitchen.. 1 Capsule Each Day 30 Minutes Before Breakfast and Dinner Meals 7)  Pro-Flora Concentrate  Caps (Probiotic Product) .... Once Daily  Allergies (verified): 1)  ! Codeine  Physical Exam  Additional Exam:  Constitutional: generally well appearing Psychiatric: alert  and oriented times 3 Abdomen: soft, non-tender, non-distended, normal bowel sounds    Impression & Recommendations:  Problem # 1:  Chronic loose stools she had improved on cholestyramine powder but stopped. She will restart at one pack a day or one half pack a day.  Problem # 2:  adenomatous colon polyps recall colonoscopy in 5 years  Problem # 3:  H. pylori gastritis her nausea, vomiting symptoms all completely resolved after antibiotic treatment. She knows to call if the symptoms returned.  Patient Instructions: 1)  Restart cholestyramine powder at 1 packet a day or 1/2 packet a day. This is to help your chronic loose stools. 2)  Return to see Dr. Christella Hartigan as needed. 3)  The medication list was reviewed and reconciled.  All changed / newly prescribed medications were explained.  A complete medication list was provided to the patient / caregiver.

## 2010-10-08 ENCOUNTER — Telehealth: Payer: Self-pay | Admitting: Gastroenterology

## 2010-10-08 NOTE — Telephone Encounter (Signed)
Left message on machine to call back  

## 2010-10-08 NOTE — Telephone Encounter (Signed)
i agree, thanks 

## 2010-10-08 NOTE — Telephone Encounter (Signed)
Pt has had diarrhea since Friday, at her last office visit in Feb of this year Dr Christella Hartigan has instructed the pt to use 1/2 to 1 packet of Cholestyramine powder daily.  She has not done this.  I advised her to start the powder and call back if her symptoms do not improve or worsen.

## 2010-10-22 NOTE — Discharge Summary (Signed)
NAMETESSLYN, BAUMERT NO.:  [id number]   MEDICAL RECORD NO.:  [id number]          PATIENT TYPE:  OBV   LOCATION:  4732                         FACILITY:  [facility]   PHYSICIAN:  Nanetta Batty, M.D.   DATE OF BIRTH:  [birth year]   DATE OF ADMISSION:  01/07/2008  DATE OF DISCHARGE:  01/10/2008                               DISCHARGE SUMMARY   ATTENDING PHYSICIAN:  Nanetta Batty, MD.   DISCHARGING PHYSICIAN:  Cristy Hilts. Jacinto Halim, MD   DISCHARGE DIAGNOSES:  1. Chest pain, etiology undetermined, status post clean      catheterization during this admission.  2. Dyslipidemia.  3. Ongoing smoking abuse.  4. Attention deficit hyperactivity disorder.  5. Major depression.  6. Status post cholecystectomy.  7. Status post appendectomy.  8. Status post tonsillectomy.   This is a 65 year old female who presented to [facility] with complaints of chest pain after she has been seen at Urgent  Care.  The patient said that pain started around 6:30 in the morning and  prior to that the patient just did not feel well and did not sleep well  the previous night.  The pain was continued and although the patient  went to work, she really did not feel good and decided to go to the  Urgent Care for evaluation.  At Urgent Care, she was given aspirin with  relief of pain and sent to the emergency room.  During her evaluation in  the ER, the patient was experiencing very mild discomfort 1 on a scale  of 10.  We admitted her on rule-out MI protocol and cycled her cardiac  enzymes which revealed normal results.  Because admission was on Friday,  the patient has been in the hospital over the weekend and on Monday  January 10, 2008, she underwent coronary angiography.  The procedure was  performed by Dr. Jacinto Halim, revealed coronaries free of any atherosclerotic  plaques,  but her right external iliac and common femoral arteries had a  diffuse stenosis of 30%-60%.  The lesion in the  common femoral artery  was suspicious for fibromuscular dysplasia.   She was on the bed rest in the hospital after procedure and after bed  rest expired, but the plan was to discharge the patient if she remains  stable after ambulation.   HOSPITAL LABORATORIES:  BMP showed sodium 139, potassium 4.0, chloride  108, CO2 of 25, BUN 13, creatinine 0.81, and the glucose 100.  CBC  showed white blood cell count 7.5, hemoglobin 13.7, hematocrit 40.0,  platelet count 290.  Her cardiac panels were negative x3.  Lipid profile  showed total cholesterol 256, triglycerides 257, HDL 37, LDL 168, and  the patient was started on statin therapy while in the hospital.   DISCHARGE MEDICATIONS:  Celexa 20 mg daily in the evening and 40 mg  daily in the morning, aspirin 81 mg daily, Zocor 40 mg daily, and  Protonix 40 mg daily.   DISCHARGE FOLLOWUP:  She will see Dr. Allyson Sabal in our office on January 27, 2008, at 3:15.  Raymon Mutton, P.A.      Nanetta Batty, M.D.  Electronically Signed    MK/MEDQ  D:  01/10/2008  T:  01/11/2008  Job:  [id number]

## 2011-03-07 LAB — CBC
HCT: 40.6
HCT: 40
Hemoglobin: 13.7
Hemoglobin: 14.3
MCHC: 35
MCV: 98.4
MCV: 97.3
MCV: 97.8
Platelets: 281
RBC: 4.13
RBC: 4.09
RDW: 12.9
WBC: 8.1
WBC: 7.5

## 2011-03-07 LAB — COMPREHENSIVE METABOLIC PANEL
ALT: 30
AST: 22
Albumin: 3.9
CO2: 22
Calcium: 8.7
Chloride: 106
Creatinine, Ser: 0.74
GFR calc Af Amer: >60
GFR calc non Af Amer: >60
Sodium: 135

## 2011-03-07 LAB — CARDIAC PANEL(CRET KIN+CKTOT+MB+TROPI)
CK, MB: 1.6
Total CK: 88
Total CK: 86

## 2011-03-07 LAB — BASIC METABOLIC PANEL
BUN: 13
Chloride: 108
GFR calc Af Amer: >60
Potassium: 4
Sodium: 139

## 2011-03-07 LAB — DIFFERENTIAL
Basophils Relative: 0
Eosinophils Absolute: 0.1
Monocytes Relative: 6
Neutrophils Relative %: 57

## 2011-03-07 LAB — TSH: TSH: 2.244

## 2011-03-07 LAB — CK TOTAL AND CKMB (NOT AT ARMC)
CK, MB: 1.7
Relative Index: INVALID

## 2011-03-07 LAB — LIPID PANEL
LDL Cholesterol: 168 — ABNORMAL HIGH
VLDL: 51 — ABNORMAL HIGH

## 2011-03-07 LAB — PROTIME-INR: Prothrombin Time: 12.2

## 2011-03-07 LAB — MAGNESIUM: Magnesium: 2.1

## 2011-05-16 ENCOUNTER — Other Ambulatory Visit: Payer: Self-pay | Admitting: Gastroenterology

## 2012-04-28 ENCOUNTER — Ambulatory Visit (INDEPENDENT_AMBULATORY_CARE_PROVIDER_SITE_OTHER): Payer: Medicare Other | Admitting: Emergency Medicine

## 2012-04-28 VITALS — BP 106/68 | HR 71 | Temp 97.6°F | Resp 16 | Ht 63.5 in | Wt 174.0 lb

## 2012-04-28 DIAGNOSIS — J018 Other acute sinusitis: Secondary | ICD-10-CM

## 2012-04-28 MED ORDER — AMOXICILLIN 875 MG PO TABS: 875.0000 mg | ORAL_TABLET | Freq: Two times a day (BID) | ORAL | Status: DC

## 2012-04-28 MED ORDER — PSEUDOEPHEDRINE-GUAIFENESIN ER 60-600 MG PO TB12: 1.0000 | ORAL_TABLET | Freq: Two times a day (BID) | ORAL | Status: AC

## 2012-04-28 NOTE — Progress Notes (Signed)
[facility] [address] [place] [address] [phone]  Date:  04/28/2012   Name:  Tara Davis   DOB:  [birth year]   MRN:  [id number]  PCP:  Thomos Lemons, DO    Chief Complaint: Nasal Congestion and Otalgia   History of Present Illness:  Tara Davis is a 66 y.o. very pleasant female patient who presents with the following:  Became ill yesterday with nasal congestion and post nasal drainage that is foul tasting.  Some mucoid discharge from nose. Has a sore throat.  No cough, wheezing or shortness of breath.  No fever or chills. No nausea or vomiting.  No improvement with OTC meds.  Patient Active Problem List  Diagnosis  . IBS  . RECTAL BLEEDING  . NAUSEA WITH VOMITING  . DIARRHEA    Past Medical History  Diagnosis Date  . Depression   . GERD (gastroesophageal reflux disease)   . Bilateral cataracts     within 6 mos.  . Anxiety     Past Surgical History  Procedure Date  . Tonsillectomy   . Appendectomy   . Cholecystectomy     History  Substance Use Topics  . Smoking status: Current Every Day Smoker  . Smokeless tobacco: Not on file  . Alcohol Use: Yes    Family History  Problem Relation Age of Onset  . Alzheimer's disease Mother   . Depression Father   . Cancer Sister 23    breast    Allergies  Allergen Reactions  . Codeine     REACTION: hallucinations    Medication list has been reviewed and updated.  Current Outpatient Prescriptions on File Prior to Visit  Medication Sig Dispense Refill  . buPROPion (WELLBUTRIN) 100 MG tablet Take 100 mg by mouth daily.      Marland Kitchen NEXIUM 40 MG capsule TAKE ONE CAPSULE BY MOUTH EACH DAY 30 MINUTES BEFORE BREAKFAST AND DINNER MEALS.  30 each  11  . sertraline (ZOLOFT) 50 MG tablet Take 50 mg by mouth daily.        Review of Systems:  As per HPI, otherwise negative.    Physical Examination: Filed Vitals:   04/28/12 0947  BP: 106/68  Pulse: 71  Temp: 97.6 F (36.4 C)  Resp: 16     Filed Vitals:   04/28/12 0947  Height: 5' 3.5" (1.613 m)  Weight: 174 lb (78.926 kg)   Body mass index is 30.34 kg/(m^2). Ideal Body Weight: Weight in (lb) to have BMI = 25: 143.1   GEN: WDWN, NAD, Non-toxic, A & O x 3  No rash, sepsis, or shortness of breath HEENT: Atraumatic, Normocephalic. Neck supple. No masses, No LAD.  Oropharynx  Negative.  Nasal mucosa green drainage Ears and Nose: No external deformity.  TM negative CV: RRR, No M/G/R. No JVD. No thrill. No extra heart sounds. PULM: CTA B, no wheezes, crackles, rhonchi. No retractions. No resp. distress. No accessory muscle use. ABD: S, NT, ND, +BS. No rebound. No HSM. EXTR: No c/c/e NEURO Normal gait.  PSYCH: Normally interactive. Conversant. Not depressed or anxious appearing.  Calm demeanor.    Assessment and Plan: Sinusitis Amoxicillin mucinex d Follow up as needed  Carmelina Dane, MD

## 2013-01-17 ENCOUNTER — Ambulatory Visit: Payer: BLUE CROSS/BLUE SHIELD

## 2013-01-24 ENCOUNTER — Ambulatory Visit (INDEPENDENT_AMBULATORY_CARE_PROVIDER_SITE_OTHER): Payer: BLUE CROSS/BLUE SHIELD | Admitting: Family Medicine

## 2013-01-24 VITALS — BP 120/64 | HR 70 | Temp 98.6°F | Resp 18 | Ht 64.0 in | Wt 179.0 lb

## 2013-01-24 DIAGNOSIS — D7589 Other specified diseases of blood and blood-forming organs: Secondary | ICD-10-CM

## 2013-01-24 DIAGNOSIS — R05 Cough: Secondary | ICD-10-CM

## 2013-01-24 DIAGNOSIS — R059 Cough, unspecified: Secondary | ICD-10-CM

## 2013-01-24 LAB — POCT CBC
Granulocyte percent: 69.9 %G (ref 37–80)
MID (cbc): 0.4 (ref 0–0.9)
MPV: 8.4 fL (ref 0–99.8)
POC Granulocyte: 6.4 (ref 2–6.9)
POC LYMPH PERCENT: 25.3 %L (ref 10–50)
POC MID %: 4.8 %M (ref 0–12)
Platelet Count, POC: 330 10*3/uL (ref 142–424)
RBC: 4.49 M/uL (ref 4.04–5.48)
RDW, POC: 13.3 %

## 2013-01-24 LAB — FOLATE: Folate: >20 ng/mL

## 2013-01-24 MED ORDER — ALBUTEROL SULFATE HFA 108 (90 BASE) MCG/ACT IN AERS: 2.0000 | INHALATION_SPRAY | Freq: Four times a day (QID) | RESPIRATORY_TRACT | Status: DC | PRN

## 2013-01-24 MED ORDER — BENZONATATE 100 MG PO CAPS: 100.0000 mg | ORAL_CAPSULE | Freq: Three times a day (TID) | ORAL | Status: DC | PRN

## 2013-01-24 NOTE — Progress Notes (Signed)
[facility] [address] [place] [address] [phone]  Date:  01/24/2013   Name:  Tara Davis   DOB:  [birth year]   MRN:  [id number]  PCP:  Thomos Lemons, DO    Chief Complaint: chest congestion x2 days   History of Present Illness:  Tara Davis is a 67 y.o. very pleasant female patient who presents with the following:  She is here today with chest congestion.  She had noted nasal discharge last week- then yesterday she noted onset of "relentless cough."    The cough is non- productive generally- she did get up a little mucus last night.   She has not noted a fever- states that 99 is normal for her.   No chills, but she does have aches due to cough.   She does have a ST, no earache.   She has noted some diarrhea since yesterday- she tends to have diarrhea, and states that her sx are just a little bit worse than usual. She suffers from IBS  One of her coworkers has been ill recently.  Otherwise she is not aware of any sick contacts.   Notes that when she had this sort of problem in the past an inhaler and tessalon helped her a lot.   She smokes less than a pack a day- "more than I should."   She has been smoking for a long time.    Patient Active Problem List   Diagnosis Date Noted  . NAUSEA WITH VOMITING 04/12/2010  . DIARRHEA 04/12/2010  . IBS 07/05/2008  . RECTAL BLEEDING 07/05/2008    Past Medical History  Diagnosis Date  . Depression   . GERD (gastroesophageal reflux disease)   . Bilateral cataracts     within 6 mos.  . Anxiety     Past Surgical History  Procedure Laterality Date  . Tonsillectomy    . Appendectomy    . Cholecystectomy      History  Substance Use Topics  . Smoking status: Current Every Day Smoker  . Smokeless tobacco: Not on file  . Alcohol Use: Yes    Family History  Problem Relation Age of Onset  . Alzheimer's disease Mother   . Depression Father   . Cancer Sister 45    breast    Allergies  Allergen  Reactions  . Codeine     REACTION: hallucinations    Medication list has been reviewed and updated.  Current Outpatient Prescriptions on File Prior to Visit  Medication Sig Dispense Refill  . buPROPion (WELLBUTRIN) 100 MG tablet Take 100 mg by mouth daily.      Marland Kitchen NEXIUM 40 MG capsule TAKE ONE CAPSULE BY MOUTH EACH DAY 30 MINUTES BEFORE BREAKFAST AND DINNER MEALS.  30 each  11  . Probiotic Product (PROBIOTIC DAILY PO) Take by mouth.      . sertraline (ZOLOFT) 50 MG tablet Take 50 mg by mouth daily.      Marland Kitchen amoxicillin (AMOXIL) 875 MG tablet Take 1 tablet (875 mg total) by mouth 2 (two) times daily.  20 tablet  0  . aspirin 81 MG tablet Take 81 mg by mouth daily.      Marland Kitchen LORazepam (ATIVAN) 0.5 MG tablet Take 0.5 mg by mouth daily.      . pseudoephedrine-guaifenesin (MUCINEX D) 60-600 MG per tablet Take 1 tablet by mouth every 12 (twelve) hours.  18 tablet  0   No current facility-administered medications on file prior  to visit.    Review of Systems:  As per HPI- otherwise negative.   Physical Examination: Filed Vitals:   01/24/13 1524  BP: 120/64  Pulse: 90  Temp: 99 F (37.2 C)  Resp: 18   Filed Vitals:   01/24/13 1524  Height: 5\' 4"  (1.626 m)  Weight: 179 lb (81.194 kg)   Body mass index is 30.71 kg/(m^2). Ideal Body Weight: Weight in (lb) to have BMI = 25: 145.3  GEN: WDWN, NAD, Non-toxic, A & O x 3, looks well, overweight HEENT: Atraumatic, Normocephalic. Neck supple. No masses, No LAD.  Bilateral TM wnl, oropharynx normal.  PEERL,EOMI.   Ears and Nose: No external deformity. CV: RRR, No M/G/R. No JVD. No thrill. No extra heart sounds. PULM: CTA B, no wheezes, crackles, rhonchi. No retractions. No resp. distress. No accessory muscle use. ABD: S, NT, ND. No rebound. No HSM. EXTR: No c/c/e NEURO Normal gait.  PSYCH: Normally interactive. Conversant. Not depressed or anxious appearing.  Calm demeanor.   Results for orders placed in visit on 01/24/13  POCT CBC       Result Value Range   WBC 9.1  4.6 - 10.2 K/uL   Lymph, poc 2.3  0.6 - 3.4   POC LYMPH PERCENT 25.3  10 - 50 %L   MID (cbc) 0.4  0 - 0.9   POC MID % 4.8  0 - 12 %M   POC Granulocyte 6.4  2 - 6.9   Granulocyte percent 69.9  37 - 80 %G   RBC 4.49  4.04 - 5.48 M/uL   Hemoglobin 14.7  12.2 - 16.2 g/dL   HCT, POC 16.1  09.6 - 47.9 %   MCV 102.6 (*) 80 - 97 fL   MCH, POC 32.7 (*) 27 - 31.2 pg   MCHC 31.9  31.8 - 35.4 g/dL   RDW, POC 04.5     Platelet Count, POC 330  142 - 424 K/uL   MPV 8.4  0 - 99.8 fL     Assessment and Plan: Cough - Plan: POCT CBC, albuterol (PROVENTIL HFA;VENTOLIN HFA) 108 (90 BASE) MCG/ACT inhaler, benzonatate (TESSALON) 100 MG capsule  Macrocytosis - Plan: Vitamin B12, Folate  Treat for allergic bronchitis/ viral URI with albuterol and tessalon.  If she is not better she is to let me know Follow- up with labs Gave hand- out regarding low dose CT chest for lung cancer screening.  She will discuss with her PCP.   Signed Abbe Amsterdam, MD

## 2013-01-24 NOTE — Patient Instructions (Addendum)
Use the albuterol as needed, and the tessalon perles.  Let me know if you are not better in the next few days- Sooner if worse.   You may qualify for a screening CT scan for lung cancer.  You also had a slightly high mean cell volume- this may indicate a B12 or folate deficiency.  I will add this on for you.

## 2013-01-25 ENCOUNTER — Encounter: Payer: Self-pay | Admitting: Family Medicine

## 2013-02-06 ENCOUNTER — Ambulatory Visit (INDEPENDENT_AMBULATORY_CARE_PROVIDER_SITE_OTHER): Payer: BLUE CROSS/BLUE SHIELD

## 2013-05-20 ENCOUNTER — Ambulatory Visit (INDEPENDENT_AMBULATORY_CARE_PROVIDER_SITE_OTHER): Payer: Medicare Other | Admitting: *Deleted

## 2013-05-20 DIAGNOSIS — Z23 Encounter for immunization: Secondary | ICD-10-CM

## 2013-08-27 ENCOUNTER — Ambulatory Visit (INDEPENDENT_AMBULATORY_CARE_PROVIDER_SITE_OTHER): Payer: Medicare Other | Admitting: Family Medicine

## 2013-08-27 VITALS — BP 130/82 | HR 82 | Temp 98.1°F | Resp 16 | Ht 63.5 in | Wt 189.2 lb

## 2013-08-27 DIAGNOSIS — IMO0002 Reserved for concepts with insufficient information to code with codable children: Secondary | ICD-10-CM

## 2013-08-27 DIAGNOSIS — M79609 Pain in unspecified limb: Secondary | ICD-10-CM

## 2013-08-27 NOTE — Patient Instructions (Signed)
Paronychia Paronychia is an inflammatory reaction involving the folds of the skin surrounding the fingernail. This is commonly caused by an infection in the skin around a nail. The most common cause of paronychia is frequent wetting of the hands (as seen with bartenders, food servers, nurses or others who wet their hands). This makes the skin around the fingernail susceptible to infection by bacteria (germs) or fungus. Other predisposing factors are:  Aggressive manicuring.  Nail biting.  Thumb sucking. The most common cause is a staphylococcal (a type of germ) infection, or a fungal (Candida) infection. When caused by a germ, it usually comes on suddenly with redness, swelling, pus and is often painful. It may get under the nail and form an abscess (collection of pus), or form an abscess around the nail. If the nail itself is infected with a fungus, the treatment is usually prolonged and may require oral medicine for up to one year. Your caregiver will determine the length of time treatment is required. The paronychia caused by bacteria (germs) may largely be avoided by not pulling on hangnails or picking at cuticles. When the infection occurs at the tips of the finger it is called felon. When the cause of paronychia is from the herpes simplex virus (HSV) it is called herpetic whitlow. TREATMENT  When an abscess is present treatment is often incision and drainage. This means that the abscess must be cut open so the pus can get out. When this is done, the following home care instructions should be followed. HOME CARE INSTRUCTIONS   It is important to keep the affected fingers very dry. Rubber or plastic gloves over cotton gloves should be used whenever the hand must be placed in water.  Keep wound clean, dry and dressed as suggested by your caregiver between warm soaks or warm compresses.  Soak in warm water for fifteen to twenty minutes three to four times per day for bacterial infections. Fungal  infections are very difficult to treat, so often require treatment for long periods of time.  For bacterial (germ) infections take antibiotics (medicine which kill germs) as directed and finish the prescription, even if the problem appears to be solved before the medicine is gone.  Only take over-the-counter or prescription medicines for pain, discomfort, or fever as directed by your caregiver. SEEK IMMEDIATE MEDICAL CARE IF:  You have redness, swelling, or increasing pain in the wound.  You notice pus coming from the wound.  You have a fever.  You notice a bad smell coming from the wound or dressing. Document Released: 11/19/2000 Document Revised: 08/18/2011 Document Reviewed: 07/21/2008 Exit[employer] Patient Information 2014 [employer].  

## 2013-08-27 NOTE — Progress Notes (Signed)
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  She tried to I&D the finger today.  Pain is increasing.  Recently had root canal  Objective:  NAD Right finger I&D's with pus released.  Assessment:  Paronychia  Plan:  Doxycycline 100 bid and hot soaks.  Ruthe Mannan, MD

## 2013-11-01 ENCOUNTER — Other Ambulatory Visit: Payer: Self-pay | Admitting: Dermatology

## 2014-06-03 ENCOUNTER — Ambulatory Visit (INDEPENDENT_AMBULATORY_CARE_PROVIDER_SITE_OTHER): Payer: Medicare Other | Admitting: Family Medicine

## 2014-06-03 VITALS — BP 118/74 | HR 76 | Temp 98.7°F | Resp 18 | Ht 63.5 in | Wt 187.2 lb

## 2014-06-03 DIAGNOSIS — S39011A Strain of muscle, fascia and tendon of abdomen, initial encounter: Secondary | ICD-10-CM

## 2014-06-03 DIAGNOSIS — R109 Unspecified abdominal pain: Secondary | ICD-10-CM

## 2014-06-03 DIAGNOSIS — J209 Acute bronchitis, unspecified: Secondary | ICD-10-CM

## 2014-06-03 LAB — POCT URINALYSIS DIPSTICK
Bilirubin, UA: NEGATIVE
GLUCOSE UA: NEGATIVE
Ketones, UA: NEGATIVE
Leukocytes, UA: NEGATIVE
NITRITE UA: NEGATIVE
PROTEIN UA: NEGATIVE
Spec Grav, UA: 1.02
UROBILINOGEN UA: 0.2
pH, UA: 6

## 2014-06-03 LAB — POCT UA - MICROSCOPIC ONLY
CASTS, UR, LPF, POC: NEGATIVE
Crystals, Ur, HPF, POC: NEGATIVE
Mucus, UA: NEGATIVE
Yeast, UA: NEGATIVE

## 2014-06-03 MED ORDER — AMOXICILLIN 875 MG PO TABS: 875.0000 mg | ORAL_TABLET | Freq: Two times a day (BID) | ORAL | Status: DC

## 2014-06-03 MED ORDER — FLUCONAZOLE 150 MG PO TABS: 150.0000 mg | ORAL_TABLET | Freq: Once | ORAL | Status: DC

## 2014-06-03 MED ORDER — BENZONATATE 100 MG PO CAPS: 100.0000 mg | ORAL_CAPSULE | Freq: Three times a day (TID) | ORAL | Status: DC | PRN

## 2014-06-03 MED ORDER — HYDROCODONE-HOMATROPINE 5-1.5 MG/5ML PO SYRP: 5.0000 mL | ORAL_SOLUTION | ORAL | Status: DC | PRN

## 2014-06-03 NOTE — Patient Instructions (Addendum)
Drink plenty of fluids and get enough rest  Take the amoxicillin one twice daily for infection  Hold onto the Diflucan prescription to see if you get any yeast, then get it filled if needed  Take the benzonatate (Tessalon) pills one or 2 pills 3 times daily as needed for cough. This is nonsedating and you can take them while you're working usually.  Take the Hycodan syrup 1 teaspoon every 4-6 hours if needed for cough. This is sedating, best taken when you're not planning on going to work  Return if continued to run fever or get worse. X-rays will not be done today, but if you're getting worse further evaluation would be indicated.  Michael plan start working toward quitting smoking when you're ready to do so. There are terrible for you, but only you can make yourself quit. Plates to yourself that you will never pick up another one when she gets quit.  Take Aleve 2 pills twice daily as needed for the back strain. Practice good deep breathing.

## 2014-06-03 NOTE — Progress Notes (Signed)
Subjective: 68 year old lady who is had a couple of weeks history of an upper respiratory infection settled in her chest. She has been coughing a lot. She said a cough was nonproductive for a portion of the time, and now has gotten more productive again green mucus. She has been having more pain in her flanks. She thought she might have a kidney infection. She does smoke about half a pack of cigarettes a day. She has had stuffiness in her head but not blowing a lot of out of her nose. Her ears hurt some.  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  Objective: Pleasant alert lady, coughing some. Her TMs are normal. Throat clear. Neck supple without significant nodes. Chest is clear to auscultation except for forced expiration which causes coughing but not a lot of wheezing. She is tender in both flanks, just above the kidney level.  Results for orders placed or performed in visit on 06/03/14  POCT urinalysis dipstick  Result Value Ref Range   Color, UA yellow    Clarity, UA clear    Glucose, UA neg    Bilirubin, UA neg    Ketones, UA neg    Spec Grav, UA 1.020    Blood, UA trace    pH, UA 6.0    Protein, UA neg    Urobilinogen, UA 0.2    Nitrite, UA neg    Leukocytes, UA Negative   POCT UA - Microscopic Only  Result Value Ref Range   WBC, Ur, HPF, POC 0-1    RBC, urine, microscopic 2-4    Bacteria, U Microscopic traace    Mucus, UA neg    Epithelial cells, urine per micros 1-3    Crystals, Ur, HPF, POC neg    Casts, Ur, LPF, POC neg    Yeast, UA neg    Assessment: Acute bronchitis Status post upper respiratory infection Flank pain secondary to muscle strain from coughing  Plan: Amoxicillin Diflucan because she says she always gets yeast infection on antibiotics Drink plenty of fluids and get enough rest Talk to about making a plan for stopping smoking. Return if not improving

## 2014-06-06 ENCOUNTER — Telehealth: Payer: Self-pay

## 2014-06-06 NOTE — Telephone Encounter (Signed)
Patient called stating the "amoxicillin" she was given cause her to have constant diarrhea. She started having it on this past Sunday. She is requesting a different antibiotic if possible and also she is requesting "Ultra Flora". Patient requesting the medication to be sent to [facility] on [facility]. Patients call back number is [phone]

## 2014-06-07 MED ORDER — AZITHROMYCIN 250 MG PO TABS: ORAL_TABLET | ORAL | Status: DC

## 2014-06-07 NOTE — Telephone Encounter (Signed)
Pt called this morning again in regards to her abx- please advise change. Pt has d/c amoxicillin.  Cell [phone]

## 2014-06-07 NOTE — Telephone Encounter (Signed)
Pt has been advised.

## 2014-06-07 NOTE — Telephone Encounter (Signed)
Meds ordered this encounter  Medications  . azithromycin (ZITHROMAX) 250 MG tablet    Sig: Take 2 tabs PO x 1 dose, then 1 tab PO QD x 4 days    Dispense:  6 tablet    Refill:  0    Order Specific Question:  Supervising Provider    Answer:  DOOLITTLE, ROBERT P [[id number]]   I believe that Ultra Dianah Field is a pro-biotic-I'm not aware that it requires a prescription.

## 2014-12-17 ENCOUNTER — Ambulatory Visit (INDEPENDENT_AMBULATORY_CARE_PROVIDER_SITE_OTHER): Payer: Medicare Other | Admitting: Family Medicine

## 2014-12-17 VITALS — BP 130/78 | HR 63 | Temp 98.2°F | Resp 18 | Ht 63.25 in | Wt 179.2 lb

## 2014-12-17 DIAGNOSIS — K589 Irritable bowel syndrome without diarrhea: Secondary | ICD-10-CM

## 2014-12-17 DIAGNOSIS — R109 Unspecified abdominal pain: Secondary | ICD-10-CM

## 2014-12-17 DIAGNOSIS — K529 Noninfective gastroenteritis and colitis, unspecified: Secondary | ICD-10-CM

## 2014-12-17 LAB — CBC WITH DIFFERENTIAL/PLATELET
Basophils Absolute: 0 10*3/uL (ref 0.0–0.1)
Basophils Relative: 0 % (ref 0–1)
Eosinophils Absolute: 0.2 10*3/uL (ref 0.0–0.7)
Eosinophils Relative: 2 % (ref 0–5)
HEMATOCRIT: 42.2 % (ref 36.0–46.0)
HEMOGLOBIN: 14.5 g/dL (ref 12.0–15.0)
LYMPHS ABS: 2.8 10*3/uL (ref 0.7–4.0)
Lymphocytes Relative: 37 % (ref 12–46)
MCH: 33.5 pg (ref 26.0–34.0)
MCHC: 34.4 g/dL (ref 30.0–36.0)
MCV: 97.5 fL (ref 78.0–100.0)
MONOS PCT: 6 % (ref 3–12)
MPV: 8.9 fL (ref 8.6–12.4)
Monocytes Absolute: 0.5 10*3/uL (ref 0.1–1.0)
NEUTROS ABS: 4.1 10*3/uL (ref 1.7–7.7)
NEUTROS PCT: 55 % (ref 43–77)
Platelets: 328 10*3/uL (ref 150–400)
RBC: 4.33 MIL/uL (ref 3.87–5.11)
RDW: 13 % (ref 11.5–15.5)
WBC: 7.5 10*3/uL (ref 4.0–10.5)

## 2014-12-17 LAB — POCT URINALYSIS DIPSTICK
Bilirubin, UA: NEGATIVE
Glucose, UA: NEGATIVE
Ketones, UA: NEGATIVE
LEUKOCYTES UA: NEGATIVE
NITRITE UA: NEGATIVE
PH UA: 5.5
Protein, UA: NEGATIVE
Spec Grav, UA: 1.02
Urobilinogen, UA: 0.2

## 2014-12-17 LAB — POCT UA - MICROSCOPIC ONLY
CASTS, UR, LPF, POC: NEGATIVE
CRYSTALS, UR, HPF, POC: NEGATIVE
YEAST UA: NEGATIVE

## 2014-12-17 NOTE — Progress Notes (Addendum)
Subjective:  This chart was scribed for Tara Cheadle, MD by Tara Davis, Medical Scribe. This patient was seen in Room 8 and the patient's care was started 2:24 PM.    Patient ID: Tara Davis, female    DOB: August 08, 1945, 69 y.o.   MRN: [id number] Chief Complaint  Patient presents with  . Back Pain    Left side of lower back pain x  3 days -NKI-    HPI Tara Davis is a 69 y.o. female who presents to [facility] complaining of back pain near the kidneys for past 3 days.  She has taken ibuprofen for temporary relief. She notes that these symptoms have occurred before. She would wake up in the middle of the night to urinate. She has been drinking cranberry juice to help with this. She has increased urinary frequency. She denies nausea, fever, dysuria, hematuria, urgency, vaginal bleeding, and vaginal discharge. She also notes that her bowel movements are normal since she's been off gluten since January 1st, 2016.    Past surgical history includes appendectomy, cholecystectomy, tonsillectomy and tubal ligation.  She has had bladder infection in the past.    Past Medical History  Diagnosis Date  . Depression   . GERD (gastroesophageal reflux disease)   . Bilateral cataracts     within 6 mos.  . Anxiety    Current Outpatient Prescriptions on File Prior to Visit  Medication Sig Dispense Refill  . buPROPion (WELLBUTRIN) 100 MG tablet Take 100 mg by mouth daily.    . fluconazole (DIFLUCAN) 150 MG tablet Take 1 tablet (150 mg total) by mouth once. Repeat if needed 2 tablet 0  . LORazepam (ATIVAN) 0.5 MG tablet Take 0.5 mg by mouth daily.    . sertraline (ZOLOFT) 50 MG tablet Take 50 mg by mouth daily.     No current facility-administered medications on file prior to visit.   Allergies  Allergen Reactions  . Codeine     REACTION: hallucinations      Review of Systems  Constitutional: Negative for fever and diaphoresis.  Gastrointestinal: Negative for nausea, vomiting, diarrhea and  constipation.  Genitourinary: Positive for frequency. Negative for dysuria, urgency, hematuria, vaginal bleeding and vaginal discharge.  Musculoskeletal: Positive for back pain.  Skin: Negative for rash.       Objective:   Physical Exam  Constitutional: She is oriented to person, place, and time. She appears well-developed and well-nourished. No distress.  HENT:  Head: Normocephalic and atraumatic.  Eyes: EOM are normal. Pupils are equal, round, and reactive to light.  Neck: Neck supple.  Cardiovascular: Normal rate, regular rhythm and normal heart sounds.   No murmur heard. Pulses:      Posterior tibial pulses are 2+ on the right side, and 2+ on the left side.  Pulmonary/Chest: Effort normal. No respiratory distress.  Abdominal: She exhibits distension (mild). Bowel sounds are increased. There is tenderness (general, no focal). There is no rebound, no guarding, no tenderness at McBurney's point and negative Murphy's sign.  left greater than right CVA tenderness  Musculoskeletal: Normal range of motion.  no tenderness over lumbar spine, or over SI joints No edema in lower extremities   Neurological: She is alert and oriented to person, place, and time.  Skin: Skin is warm and dry.  Psychiatric: She has a normal mood and affect. Her behavior is normal.  Nursing note and vitals reviewed.  BP 130/78 mmHg  Pulse 63  Temp(Src) 98.2 F (36.8 C) (Oral)  Resp  18  Ht 5' 3.25" (1.607 m)  Wt 179 lb 3.2 oz (81.285 kg)  BMI 31.48 kg/m2  SpO2 98%  Negative orthostatics   Results for orders placed or performed in visit on 12/17/14  CBC with Differential/Platelet  Result Value Ref Range   WBC 7.5 4.0 - 10.5 K/uL   RBC 4.33 3.87 - 5.11 MIL/uL   Hemoglobin 14.5 12.0 - 15.0 g/dL   HCT 42.2 36.0 - 46.0 %   MCV 97.5 78.0 - 100.0 fL   MCH 33.5 26.0 - 34.0 pg   MCHC 34.4 30.0 - 36.0 g/dL   RDW 13.0 11.5 - 15.5 %   Platelets 328 150 - 400 K/uL   MPV 8.9 8.6 - 12.4 fL   Neutrophils  Relative % 55 43 - 77 %   Neutro Abs 4.1 1.7 - 7.7 K/uL   Lymphocytes Relative 37 12 - 46 %   Lymphs Abs 2.8 0.7 - 4.0 K/uL   Monocytes Relative 6 3 - 12 %   Monocytes Absolute 0.5 0.1 - 1.0 K/uL   Eosinophils Relative 2 0 - 5 %   Eosinophils Absolute 0.2 0.0 - 0.7 K/uL   Basophils Relative 0 0 - 1 %   Basophils Absolute 0.0 0.0 - 0.1 K/uL   Smear Review Criteria for review not met   POCT UA - Microscopic Only  Result Value Ref Range   WBC, Ur, HPF, POC 0-3    RBC, urine, microscopic 0-3    Bacteria, U Microscopic 2+    Mucus, UA small    Epithelial cells, urine per micros 10-20    Crystals, Ur, HPF, POC negative    Casts, Ur, LPF, POC negative    Yeast, UA negative   POCT urinalysis dipstick  Result Value Ref Range   Color, UA yellow    Clarity, UA clear    Glucose, UA negative    Bilirubin, UA negative    Ketones, UA negative    Spec Grav, UA 1.020    Davis, UA small    pH, UA 5.5    Protein, UA negative    Urobilinogen, UA 0.2    Nitrite, UA negative    Leukocytes, UA Negative Negative       Assessment & Plan:   1. Left flank pain  - do not expect pyelo due to nml UA and vague sxs but pt sure that prior pyelo felt very similar to this - try pyridium today and tomorrow while cbc and UClx P - if either are + or pt has great response to pyridium, will start keflex.  If sxs do not improve on pyridium and UClx is neg then rec imaging - could be due to IBS?  2. Irritable bowel syndrome - diarrhea predominant - new diagnosis today  - try IBGuard and/or mint.  Try low FODMAPs diet  3. Chronic diarrhea  - suspect IBS-D in addition to prior cholecysectomy and gluten sensitivity, ok to cont prn imodium. Saw Dr. Ardis Davis yrs prior for routine colonoscopy - he recommended cholestyramine and probiotics to which pt did not respond.    Orders Placed This Encounter  Procedures  . Urine culture  . Comprehensive metabolic panel  . CBC with Differential/Platelet  . POCT UA -  Microscopic Only  . POCT urinalysis dipstick    I personally performed the services described in this documentation, which was scribed in my presence. The recorded information has been reviewed and considered, and addended by me as needed.  Tara Davis  Brigitte Pulse, MD MPH

## 2014-12-17 NOTE — Patient Instructions (Signed)
Look up the FODMAPs diet - it might be interesting to see if any of these things on the diet seem to make your symptoms worse.  Consider trying mint - like mint tea or IBS guard or even altoids to soothe your stomach. You can try some tylenol for the pain but try to stay away from any other otc pain medication - so no aleve, ibuprofen, motrin, advil, etc.   Pyelonephritis, Adult Pyelonephritis is a kidney infection. In general, there are 2 main types of pyelonephritis:  Infections that come on quickly without any warning (acute pyelonephritis).  Infections that persist for a long period of time (chronic pyelonephritis). CAUSES  Two main causes of pyelonephritis are:  Bacteria traveling from the bladder to the kidney. This is a problem especially in pregnant women. The urine in the bladder can become filled with bacteria from multiple causes, including:  Inflammation of the prostate gland (prostatitis).  Sexual intercourse in females.  Bladder infection (cystitis).  Bacteria traveling from the bloodstream to the tissue part of the kidney. Problems that may increase your risk of getting a kidney infection include:  Diabetes.  Kidney stones or bladder stones.  Cancer.  Catheters placed in the bladder.  Other abnormalities of the kidney or ureter. SYMPTOMS   Abdominal pain.  Pain in the side or flank area.  Fever.  Chills.  Upset stomach.  Blood in the urine (dark urine).  Frequent urination.  Strong or persistent urge to urinate.  Burning or stinging when urinating. DIAGNOSIS  Your caregiver may diagnose your kidney infection based on your symptoms. A urine sample may also be taken. TREATMENT  In general, treatment depends on how severe the infection is.   If the infection is mild and caught early, your caregiver may treat you with oral antibiotics and send you home.  If the infection is more severe, the bacteria may have gotten into the bloodstream. This will  require intravenous (IV) antibiotics and a hospital stay. Symptoms may include:  High fever.  Severe flank pain.  Shaking chills.  Even after a hospital stay, your caregiver may require you to be on oral antibiotics for a period of time.  Other treatments may be required depending upon the cause of the infection. HOME CARE INSTRUCTIONS   Take your antibiotics as directed. Finish them even if you start to feel better.  Make an appointment to have your urine checked to make sure the infection is gone.  Drink enough fluids to keep your urine clear or pale yellow.  Take medicines for the bladder if you have urgency and frequency of urination as directed by your caregiver. SEEK IMMEDIATE MEDICAL CARE IF:   You have a fever or persistent symptoms for more than 2-3 days.  You have a fever and your symptoms suddenly get worse.  You are unable to take your antibiotics or fluids.  You develop shaking chills.  You experience extreme weakness or fainting.  There is no improvement after 2 days of treatment. MAKE SURE YOU:  Understand these instructions.  Will watch your condition.  Will get help right away if you are not doing well or get worse. Document Released: 05/26/2005 Document Revised: 11/25/2011 Document Reviewed: 10/30/2010 [facility] Patient Information 2015 [place], [place]. This information is not intended to replace advice given to you by your health care provider. Make sure you discuss any questions you have with your health care provider.  Diet and Irritable Bowel Syndrome  No cure has been found for irritable bowel  syndrome (IBS). Many options are available to treat the symptoms. Your caregiver will give you the best treatments available for your symptoms. He or she will also encourage you to manage stress and to make changes to your diet. You need to work with your caregiver and Registered Dietician to find the best combination of medicine, diet, counseling, and support  to control your symptoms. The following are some diet suggestions. FOODS THAT MAKE IBS WORSE  Fatty foods, such as [place] fries.  Milk products, such as cheese or ice cream.  Chocolate.  Alcohol.  Caffeine (found in coffee and some sodas).  Carbonated drinks, such as soda. If certain foods cause symptoms, you should eat less of them or stop eating them. FOOD JOURNAL   Keep a journal of the foods that seem to cause distress. Write down:  What you are eating during the day and when.  What problems you are having after eating.  When the symptoms occur in relation to your meals.  What foods always make you feel badly.  Take your notes with you to your caregiver to see if you should stop eating certain foods. FOODS THAT MAKE IBS BETTER Fiber reduces IBS symptoms, especially constipation, because it makes stools soft, bulky, and easier to pass. Fiber is found in bran, bread, cereal, beans, fruit, and vegetables. Examples of foods with fiber include:  Apples.  Peaches.  Pears.  Berries.  Figs.  Broccoli, raw.  Cabbage.  Carrots.  Raw peas.  Kidney beans.  Lima beans.  Whole-grain bread.  Whole-grain cereal. Add foods with fiber to your diet a little at a time. This will let your body get used to them. Too much fiber at once might cause gas and swelling of your abdomen. This can trigger symptoms in a person with IBS. Caregivers usually recommend a diet with enough fiber to produce soft, painless bowel movements. High fiber diets may cause gas and bloating. However, these symptoms often go away within a few weeks, as your body adjusts. In many cases, dietary fiber may lessen IBS symptoms, particularly constipation. However, it may not help pain or diarrhea. High fiber diets keep the colon mildly enlarged (distended) with the added fiber. This may help prevent spasms in the colon. Some forms of fiber also keep water in the stool, thereby preventing hard stools that are  difficult to pass.  Besides telling you to eat more foods with fiber, your caregiver may also tell you to get more fiber by taking a fiber pill or drinking water mixed with a special high fiber powder. An example of this is a natural fiber laxative containing psyllium seed.  TIPS  Large meals can cause cramping and diarrhea in people with IBS. If this happens to you, try eating 4 or 5 small meals a day, or try eating less at each of your usual 3 meals. It may also help if your meals are low in fat and high in carbohydrates. Examples of carbohydrates are pasta, rice, whole-grain breads and cereals, fruits, and vegetables.  If dairy products cause your symptoms to flare up, you can try eating less of those foods. You might be able to handle yogurt better than other dairy products, because it contains bacteria that helps with digestion. Dairy products are an important source of calcium and other nutrients. If you need to avoid dairy products, be sure to talk with a Registered Dietitian about getting these nutrients through other food sources.  Drink enough water and fluids to keep your  urine clear or pale yellow. This is important, especially if you have diarrhea. FOR MORE INFORMATION  International Foundation for Functional Gastrointestinal Disorders: www.iffgd.org  National Digestive Diseases Information Clearinghouse: digestive.AmenCredit.is Document Released: 08/16/2003 Document Revised: 08/18/2011 Document Reviewed: 08/26/2013 [facility] Patient Information 2015 [place], [place]. This information is not intended to replace advice given to you by your health care provider. Make sure you discuss any questions you have with your health care provider.

## 2014-12-18 ENCOUNTER — Telehealth: Payer: Self-pay

## 2014-12-18 LAB — COMPREHENSIVE METABOLIC PANEL
ALT: 22 U/L (ref 0–35)
AST: 15 U/L (ref 0–37)
Albumin: 4.2 g/dL (ref 3.5–5.2)
Alkaline Phosphatase: 74 U/L (ref 39–117)
BILIRUBIN TOTAL: 0.3 mg/dL (ref 0.2–1.2)
BUN: 17 mg/dL (ref 6–23)
CALCIUM: 9.4 mg/dL (ref 8.4–10.5)
CHLORIDE: 103 meq/L (ref 96–112)
CO2: 22 mEq/L (ref 19–32)
Creat: 0.96 mg/dL (ref 0.50–1.10)
GLUCOSE: 94 mg/dL (ref 70–99)
Potassium: 4 mEq/L (ref 3.5–5.3)
SODIUM: 136 meq/L (ref 135–145)
Total Protein: 6.5 g/dL (ref 6.0–8.3)

## 2014-12-18 NOTE — Telephone Encounter (Signed)
Patient called in stating that she saw Dr. Brigitte Pulse on 12/17/14 and she had some follow up questions for her and would like for Dr Brigitte Pulse to give her a call to discuss.   Her call back number is [phone]

## 2014-12-19 LAB — URINE CULTURE: Colony Count: 35000

## 2014-12-19 NOTE — Telephone Encounter (Signed)
Below were my response on Tara Davis's labs yesterday.  If the pyridium helped, we definitely want to put her on an antibiotic but the urine culture was contaminated so I have entered future labs.  I would like her to stop the pyridium and come in tonight or first thing tomorrow - she can do a lab only visit (or fine to see a provider if she prefers, labs are in)  Then we will start her on an antibiotic after recollection of the labs (unless she is completely asymptomatic off of medicine.)  Clear as mud?  I called pt and left message on her voicemail with this plan.  Notes Recorded by Shawnee Knapp, MD on 12/18/2014 at 12:22 AM Normal white blood cells so unlikely to have kidney infection - did she get any relief from the pyridium? Would recommend holding off on antibiotic for now. If pain persists, then she should return to clinic so we can consider need for imaging of her abdomen.

## 2014-12-19 NOTE — Telephone Encounter (Signed)
Spoke with pt, she states she was wondering about her lab results. I advised her the urine culture is not back yet. She did state the Pyridium did help her and wanted to know if she should continue taking it. Please advise.

## 2014-12-19 NOTE — Addendum Note (Signed)
Addended by: Delman Cheadle on: 12/19/2014 04:04 PM   Modules accepted: Orders

## 2014-12-25 ENCOUNTER — Encounter: Payer: Self-pay | Admitting: Family Medicine

## 2015-03-26 ENCOUNTER — Encounter: Payer: Self-pay | Admitting: Gastroenterology

## 2015-05-27 ENCOUNTER — Ambulatory Visit (INDEPENDENT_AMBULATORY_CARE_PROVIDER_SITE_OTHER): Payer: Medicare Other | Admitting: Family Medicine

## 2015-05-27 VITALS — BP 128/80 | HR 80 | Temp 98.1°F | Resp 17 | Ht 63.5 in | Wt 180.0 lb

## 2015-05-27 DIAGNOSIS — S0502XA Injury of conjunctiva and corneal abrasion without foreign body, left eye, initial encounter: Secondary | ICD-10-CM

## 2015-05-27 MED ORDER — OFLOXACIN 0.3 % OP SOLN: 2.0000 [drp] | OPHTHALMIC | Status: DC

## 2015-05-27 NOTE — Progress Notes (Signed)
Subjective:    Patient ID: Tara Davis, female    DOB: 14-Dec-1945, 69 y.o.   MRN: [id number]  05/27/2015  Eye Injury   HPI This 69 y.o. female presents for evaluation of L eye pain after trauma to eye yesterday.  Cooking for Christmas.  Reaching for zip lock bags and bag hit L eye.  Now having L eye pain, tearing, FB sensation. +blurred vision. +tearing +FB sensation. +photophobia.   No contacts or glasses. Slept well.  No drainage.   Review of Systems  Constitutional: Negative for fever, chills, diaphoresis and fatigue.  HENT: Negative for congestion and ear pain.   Eyes: Positive for photophobia, pain, redness and visual disturbance. Negative for discharge and itching.  Neurological: Negative for dizziness and headaches.    Past Medical History  Diagnosis Date  . Depression   . GERD (gastroesophageal reflux disease)   . Bilateral cataracts     within 6 mos.  . Anxiety    Past Surgical History  Procedure Laterality Date  . Tonsillectomy    . Appendectomy    . Cholecystectomy     Allergies  Allergen Reactions  . Codeine     REACTION: hallucinations    Social History   Social History  . Marital Status: Single    Spouse Name: N/A  . Number of Children: N/A  . Years of Education: N/A   Occupational History  . Not on file.   Social History Main Topics  . Smoking status: Current Every Day Smoker -- 0.75 packs/day for 53 years    Types: Cigarettes  . Smokeless tobacco: Not on file  . Alcohol Use: 0.0 oz/week    0 Standard drinks or equivalent per week  . Drug Use: No  . Sexual Activity: Yes   Other Topics Concern  . Not on file   Social History Narrative   Family History  Problem Relation Age of Onset  . Alzheimer's disease Mother   . Depression Father   . Cancer Sister 29    breast       Objective:    BP 128/80 mmHg  Pulse 80  Temp(Src) 98.1 F (36.7 C) (Oral)  Resp 17  Ht 5' 3.5" (1.613 m)  Wt 180 lb (81.647 kg)  BMI 31.38 kg/m2   SpO2 98% Physical Exam  Constitutional: She appears well-developed and well-nourished. No distress.  HENT:  Head: Normocephalic and atraumatic.  Right Ear: External ear normal.  Left Ear: External ear normal.  Nose: Nose normal.  Mouth/Throat: Oropharynx is clear and moist.  Eyes: EOM are normal. Pupils are equal, round, and reactive to light. Right eye exhibits no discharge. Left eye exhibits no discharge, no exudate and no hordeolum. No foreign body present in the left eye. Right conjunctiva is not injected. Right conjunctiva has no hemorrhage. Left conjunctiva is injected. Left conjunctiva has no hemorrhage.  Slit lamp exam:      The left eye shows corneal abrasion and fluorescein uptake. The left eye shows no corneal flare, no corneal ulcer, no foreign body, no hyphema, no hypopyon and no anterior chamber bulge.    Skin: She is not diaphoretic.        Assessment & Plan:   1. Corneal abrasion, left, initial encounter    -New. -Ocuflox ophthal solution every two hours.   -Refer for urgent ophthalmology evaluation tomorrow.   Orders Placed This Encounter  Procedures  . Ambulatory referral to Ophthalmology    Referral Priority:  Urgent  Referral Type:  Consultation    Referral Reason:  Specialty Services Required    Requested Specialty:  Ophthalmology    Number of Visits Requested:  1   Meds ordered this encounter  Medications  . ARIPiprazole (ABILIFY) 10 MG tablet    Sig: Take 10 mg by mouth daily.  Marland Kitchen ofloxacin (OCUFLOX) 0.3 % ophthalmic solution    Sig: Place 2 drops into the left eye every 2 (two) hours.    Dispense:  10 mL    Refill:  0    No Follow-up on file.    Zacherie Honeyman Elayne Guerin, M.D. [facility] [address] [place], [place]  [address] [phone] phone [phone] fax

## 2015-05-27 NOTE — Patient Instructions (Signed)

## 2015-07-24 DIAGNOSIS — F3341 Major depressive disorder, recurrent, in partial remission: Secondary | ICD-10-CM | POA: Diagnosis not present

## 2015-08-22 ENCOUNTER — Other Ambulatory Visit (INDEPENDENT_AMBULATORY_CARE_PROVIDER_SITE_OTHER): Payer: Self-pay

## 2015-08-22 ENCOUNTER — Telehealth: Payer: Self-pay | Admitting: Gastroenterology

## 2015-08-22 DIAGNOSIS — R109 Unspecified abdominal pain: Secondary | ICD-10-CM

## 2015-08-22 LAB — CBC WITH DIFFERENTIAL/PLATELET
BASOS PCT: 0.3 % (ref 0.0–3.0)
Basophils Absolute: 0 10*3/uL (ref 0.0–0.1)
Eosinophils Absolute: 0.2 10*3/uL (ref 0.0–0.7)
Eosinophils Relative: 1.8 % (ref 0.0–5.0)
HCT: 44.1 % (ref 36.0–46.0)
HEMOGLOBIN: 15.1 g/dL — AB (ref 12.0–15.0)
Lymphocytes Relative: 33.5 % (ref 12.0–46.0)
Lymphs Abs: 3.1 10*3/uL (ref 0.7–4.0)
MCHC: 34.2 g/dL (ref 30.0–36.0)
MCV: 97 fl (ref 78.0–100.0)
MONO ABS: 0.5 10*3/uL (ref 0.1–1.0)
Monocytes Relative: 5.1 % (ref 3.0–12.0)
NEUTROS ABS: 5.5 10*3/uL (ref 1.4–7.7)
Neutrophils Relative %: 59.3 % (ref 43.0–77.0)
Platelets: 323 10*3/uL (ref 150.0–400.0)
RBC: 4.54 Mil/uL (ref 3.87–5.11)
RDW: 13.3 % (ref 11.5–15.5)
WBC: 9.2 10*3/uL (ref 4.0–10.5)

## 2015-08-22 LAB — BASIC METABOLIC PANEL
BUN: 16 mg/dL (ref 6–23)
CO2: 27 meq/L (ref 19–32)
Calcium: 9.4 mg/dL (ref 8.4–10.5)
Chloride: 103 mEq/L (ref 96–112)
Creatinine, Ser: 0.93 mg/dL (ref 0.40–1.20)
GFR: 63.58 mL/min (ref >60.00)
GLUCOSE: 130 mg/dL — AB (ref 70–99)
POTASSIUM: 4.2 meq/L (ref 3.5–5.1)
SODIUM: 138 meq/L (ref 135–145)

## 2015-08-22 NOTE — Telephone Encounter (Signed)
You have been scheduled for a CT scan of the abdomen and pelvis at [facility] ([address]---this is in the same building as [facility]).   You are scheduled on 08/23/15 at 915 am. You should arrive 15 minutes prior to your appointment time for registration. Please follow the written instructions below on the day of your exam:  WARNING: IF YOU ARE ALLERGIC TO IODINE/X-RAY DYE, PLEASE NOTIFY RADIOLOGY IMMEDIATELY AT [phone]! YOU WILL BE GIVEN A 13 HOUR PREMEDICATION PREP.  1) Do not eat or drink anything after 515 am (4 hours prior to your test) 2) You have been given 2 bottles of oral contrast to drink. The solution may taste better if refrigerated, but do NOT add ice or any other liquid to this solution. Shake well before drinking.    Drink 1 bottle of contrast @ 715 am (2 hours prior to your exam)  Drink 1 bottle of contrast @ 815 am (1 hour prior to your exam)  You may take any medications as prescribed with a small amount of water except for the following: Metformin, Glucophage, Glucovance, Avandamet, Riomet, Fortamet, Actoplus Met, Janumet, Glumetza or Metaglip. The above medications must be held the day of the exam AND 48 hours after the exam.  The purpose of you drinking the oral contrast is to aid in the visualization of your intestinal tract. The contrast solution may cause some diarrhea. Before your exam is started, you will be given a small amount of fluid to drink. Depending on your individual set of symptoms, you may also receive an intravenous injection of x-ray contrast/dye. Plan on being at [facility] for 30 minutes or longer, depending on the type of exam you are having performed.  This test typically takes 30-45 minutes to complete.  If you have any questions regarding your exam or if you need to reschedule, you may call the CT department at [phone] between the hours of 8:00 am and 5:00 pm,  Monday-Friday.  ________________________________________________________________________ The pt has been notified and will have labs today and pick up the contrast and instructions for the procedure

## 2015-08-22 NOTE — Telephone Encounter (Signed)
You have been scheduled for a CT scan of the abdomen and pelvis at [facility] ([address]---this is in the same building as [facility]).   You are scheduled on 08/23/15 at 915 am. You should arrive 15 minutes prior to your appointment time for registration. Please follow the written instructions below on the day of your exam:  WARNING: IF YOU ARE ALLERGIC TO IODINE/X-RAY DYE, PLEASE NOTIFY RADIOLOGY IMMEDIATELY AT [phone]! YOU WILL BE GIVEN A 13 HOUR PREMEDICATION PREP.  1) Do not eat or drink anything after 515 am (4 hours prior to your test) 2) You have been given 2 bottles of oral contrast to drink. The solution may taste better if refrigerated, but do NOT add ice or any other liquid to this solution. Shake well before drinking.    Drink 1 bottle of contrast @ 715 am (2 hours prior to your exam)  Drink 1 bottle of contrast @ 815 am (1 hour prior to your exam)  You may take any medications as prescribed with a small amount of water except for the following: Metformin, Glucophage, Glucovance, Avandamet, Riomet, Fortamet, Actoplus Met, Janumet, Glumetza or Metaglip. The above medications must be held the day of the exam AND 48 hours after the exam.  The purpose of you drinking the oral contrast is to aid in the visualization of your intestinal tract. The contrast solution may cause some diarrhea. Before your exam is started, you will be given a small amount of fluid to drink. Depending on your individual set of symptoms, you may also receive an intravenous injection of x-ray contrast/dye. Plan on being at [facility] for 30 minutes or longer, depending on the type of exam you are having performed.  This test typically takes 30-45 minutes to complete.  If you have any questions regarding your exam or if you need to reschedule, you may call the CT department at [phone] between the hours of 8:00 am and 5:00 pm,  Monday-Friday.  ________________________________________________________________________

## 2015-08-22 NOTE — Telephone Encounter (Signed)
Lets get a CT scan abd/pelvis with IV and PO contrast and CBC today if possible, tomorrow at the latest.  Thanks

## 2015-08-22 NOTE — Telephone Encounter (Signed)
The pt began to have left sided abd pain just by the umbilicus on this past Sunday.  The pain is an 8/10 at the worst and does wake her up from sleep.  She also has diarrhea alternating small caliber stools.  She has not seen any blood, or mucous.  Last colon in 2011 showed diverticulosis.  She is on a liquid diet.  The pain is intermittent and has not worsened since Sunday.  Late in the evening seems to be less painful.  She has taken ibuprofen for the pain that did not help and imodium for the diarrhea which did help.  I have put her on for 08/28/15 with Dr Ardis Hughs.  Dr Ardis Hughs please review, we have no extender appointments available.  Doc of the day has no appointments available.  Please advise.

## 2015-08-23 ENCOUNTER — Ambulatory Visit (INDEPENDENT_AMBULATORY_CARE_PROVIDER_SITE_OTHER)
Admission: RE | Admit: 2015-08-23 | Discharge: 2015-08-23 | Disposition: A | Discharge status: Home / Self Care | Payer: Medicare Other | Source: Ambulatory Visit | Attending: Gastroenterology | Admitting: Gastroenterology

## 2015-08-23 ENCOUNTER — Telehealth: Payer: Self-pay | Admitting: Gastroenterology

## 2015-08-23 DIAGNOSIS — R109 Unspecified abdominal pain: Secondary | ICD-10-CM

## 2015-08-23 DIAGNOSIS — R1032 Left lower quadrant pain: Secondary | ICD-10-CM | POA: Diagnosis not present

## 2015-08-23 MED ORDER — IOHEXOL 300 MG/ML  SOLN
100.0000 mL | Freq: Once | INTRAMUSCULAR | Status: AC | PRN
Start: 2015-08-23 — End: 2015-08-23
  Administered 2015-08-23: 100 mL via INTRAVENOUS

## 2015-08-23 NOTE — Telephone Encounter (Signed)
Pt has been notified that the results have not been reviewed yet, I will call as soon as available

## 2015-08-28 ENCOUNTER — Ambulatory Visit: Payer: Self-pay | Admitting: Gastroenterology

## 2015-09-11 DIAGNOSIS — F3341 Major depressive disorder, recurrent, in partial remission: Secondary | ICD-10-CM | POA: Diagnosis not present

## 2015-10-01 DIAGNOSIS — R197 Diarrhea, unspecified: Secondary | ICD-10-CM | POA: Diagnosis not present

## 2015-10-01 DIAGNOSIS — E559 Vitamin D deficiency, unspecified: Secondary | ICD-10-CM | POA: Diagnosis not present

## 2015-10-01 DIAGNOSIS — R1084 Generalized abdominal pain: Secondary | ICD-10-CM | POA: Diagnosis not present

## 2015-10-01 DIAGNOSIS — Z0000 Encounter for general adult medical examination without abnormal findings: Secondary | ICD-10-CM | POA: Diagnosis not present

## 2015-10-01 DIAGNOSIS — Z79899 Other long term (current) drug therapy: Secondary | ICD-10-CM | POA: Diagnosis not present

## 2015-10-01 DIAGNOSIS — H109 Unspecified conjunctivitis: Secondary | ICD-10-CM | POA: Diagnosis not present

## 2015-10-04 DIAGNOSIS — H00013 Hordeolum externum right eye, unspecified eyelid: Secondary | ICD-10-CM | POA: Diagnosis not present

## 2015-10-12 ENCOUNTER — Ambulatory Visit: Payer: Self-pay | Admitting: Gastroenterology

## 2015-12-13 DIAGNOSIS — F3341 Major depressive disorder, recurrent, in partial remission: Secondary | ICD-10-CM | POA: Diagnosis not present

## 2016-01-02 DIAGNOSIS — H43812 Vitreous degeneration, left eye: Secondary | ICD-10-CM | POA: Diagnosis not present

## 2016-01-23 DIAGNOSIS — H524 Presbyopia: Secondary | ICD-10-CM | POA: Diagnosis not present

## 2016-01-28 DIAGNOSIS — R197 Diarrhea, unspecified: Secondary | ICD-10-CM | POA: Diagnosis not present

## 2016-01-28 DIAGNOSIS — R112 Nausea with vomiting, unspecified: Secondary | ICD-10-CM | POA: Diagnosis not present

## 2016-01-28 DIAGNOSIS — Z6834 Body mass index (BMI) 34.0-34.9, adult: Secondary | ICD-10-CM | POA: Diagnosis not present

## 2016-01-28 DIAGNOSIS — R7309 Other abnormal glucose: Secondary | ICD-10-CM | POA: Diagnosis not present

## 2016-01-30 ENCOUNTER — Other Ambulatory Visit: Payer: Self-pay | Admitting: Internal Medicine

## 2016-01-30 DIAGNOSIS — R112 Nausea with vomiting, unspecified: Secondary | ICD-10-CM

## 2016-01-31 DIAGNOSIS — R197 Diarrhea, unspecified: Secondary | ICD-10-CM | POA: Diagnosis not present

## 2016-02-06 DIAGNOSIS — D485 Neoplasm of uncertain behavior of skin: Secondary | ICD-10-CM | POA: Diagnosis not present

## 2016-02-06 DIAGNOSIS — Z85828 Personal history of other malignant neoplasm of skin: Secondary | ICD-10-CM | POA: Diagnosis not present

## 2016-02-06 DIAGNOSIS — D225 Melanocytic nevi of trunk: Secondary | ICD-10-CM | POA: Diagnosis not present

## 2016-02-06 DIAGNOSIS — L821 Other seborrheic keratosis: Secondary | ICD-10-CM | POA: Diagnosis not present

## 2016-02-07 ENCOUNTER — Ambulatory Visit
Admission: RE | Admit: 2016-02-07 | Discharge: 2016-02-07 | Disposition: A | Discharge status: Home / Self Care | Payer: Medicare Other | Source: Ambulatory Visit | Attending: Internal Medicine | Admitting: Internal Medicine

## 2016-02-07 DIAGNOSIS — R112 Nausea with vomiting, unspecified: Secondary | ICD-10-CM

## 2016-02-07 DIAGNOSIS — R109 Unspecified abdominal pain: Secondary | ICD-10-CM | POA: Diagnosis not present

## 2016-02-28 DIAGNOSIS — Z23 Encounter for immunization: Secondary | ICD-10-CM | POA: Diagnosis not present

## 2016-03-10 DIAGNOSIS — K760 Fatty (change of) liver, not elsewhere classified: Secondary | ICD-10-CM | POA: Diagnosis not present

## 2016-03-10 DIAGNOSIS — E785 Hyperlipidemia, unspecified: Secondary | ICD-10-CM | POA: Diagnosis not present

## 2016-03-10 DIAGNOSIS — Z79899 Other long term (current) drug therapy: Secondary | ICD-10-CM | POA: Diagnosis not present

## 2016-03-10 DIAGNOSIS — R7309 Other abnormal glucose: Secondary | ICD-10-CM | POA: Diagnosis not present

## 2016-05-12 DIAGNOSIS — Z1231 Encounter for screening mammogram for malignant neoplasm of breast: Secondary | ICD-10-CM | POA: Diagnosis not present

## 2016-05-12 DIAGNOSIS — F3341 Major depressive disorder, recurrent, in partial remission: Secondary | ICD-10-CM | POA: Diagnosis not present

## 2016-05-12 DIAGNOSIS — Z803 Family history of malignant neoplasm of breast: Secondary | ICD-10-CM | POA: Diagnosis not present

## 2016-05-13 ENCOUNTER — Encounter: Payer: Self-pay | Admitting: Gastroenterology

## 2016-05-15 ENCOUNTER — Ambulatory Visit (INDEPENDENT_AMBULATORY_CARE_PROVIDER_SITE_OTHER): Payer: Medicare Other | Admitting: Physician Assistant

## 2016-05-15 VITALS — BP 110/68 | HR 104 | Temp 99.1°F | Resp 18 | Ht 63.5 in | Wt 179.0 lb

## 2016-05-15 DIAGNOSIS — Z87891 Personal history of nicotine dependence: Secondary | ICD-10-CM

## 2016-05-15 DIAGNOSIS — R05 Cough: Secondary | ICD-10-CM

## 2016-05-15 DIAGNOSIS — B349 Viral infection, unspecified: Secondary | ICD-10-CM | POA: Diagnosis not present

## 2016-05-15 DIAGNOSIS — R0981 Nasal congestion: Secondary | ICD-10-CM

## 2016-05-15 DIAGNOSIS — R059 Cough, unspecified: Secondary | ICD-10-CM

## 2016-05-15 MED ORDER — FLUTICASONE PROPIONATE 50 MCG/ACT NA SUSP: 2.0000 | Freq: Every day | NASAL | 12 refills | Status: DC

## 2016-05-15 MED ORDER — BENZONATATE 100 MG PO CAPS: 100.0000 mg | ORAL_CAPSULE | Freq: Three times a day (TID) | ORAL | 0 refills | Status: DC | PRN

## 2016-05-15 MED ORDER — MUCINEX DM MAXIMUM STRENGTH 60-1200 MG PO TB12: 1.0000 | ORAL_TABLET | Freq: Two times a day (BID) | ORAL | 1 refills | Status: DC

## 2016-05-15 NOTE — Patient Instructions (Signed)
Please call if you are not better by Monday. I will call in a prescription for Z-pack at that time.  Try a Net-pot, hot steamy showers. Please stay WELL HYDRATED while you are healing.  "dragon tea"

## 2016-05-15 NOTE — Progress Notes (Signed)
Tara Davis  MRN: [id number] DOB: [birth year]  PCP: Maximino Greenland, MD  Subjective:  Pt is a 70 year old female who presents to clinic for cough x two days. She states she always gets a cough around this time of year and sometimes it lasts until June. Her cough is somewhat productive. She feels like she needs to cough up phlegm but isn't. It is worse in the morning. Her cough and congestion are keeping her up at night.   Is taking Mucinex - helps some.  Denies headache, sinus pressure, fever, chills, nausea, vomiting, chest pain, chest pressure, wheezing, SOB, palpitations.   Smoking history - 45 pack year history   Review of Systems  Constitutional: Positive for chills. Negative for diaphoresis, fatigue and fever.  HENT: Positive for congestion and postnasal drip. Negative for rhinorrhea, sinus pressure, sneezing and sore throat.   Respiratory: Positive for cough. Negative for chest tightness, shortness of breath and wheezing.   Cardiovascular: Negative for chest pain and palpitations.  Gastrointestinal: Negative for abdominal pain, diarrhea, nausea and vomiting.  Neurological: Negative for weakness, light-headedness and headaches.  Psychiatric/Behavioral: Positive for sleep disturbance.    Patient Active Problem List   Diagnosis Date Noted  . NAUSEA WITH VOMITING 04/12/2010  . DIARRHEA 04/12/2010  . IBS 07/05/2008  . RECTAL BLEEDING 07/05/2008    Current Outpatient Prescriptions on File Prior to Visit  Medication Sig Dispense Refill  . LORazepam (ATIVAN) 0.5 MG tablet Take 0.5 mg by mouth daily.    . sertraline (ZOLOFT) 50 MG tablet Take 100 mg by mouth daily.     Marland Kitchen ofloxacin (OCUFLOX) 0.3 % ophthalmic solution Place 2 drops into the left eye every 2 (two) hours. 10 mL 0   No current facility-administered medications on file prior to visit.     Allergies  Allergen Reactions  . Codeine     REACTION: hallucinations     Objective:  BP 110/68 (BP Location: Right Arm,  Patient Position: Sitting, Cuff Size: Small)   Pulse (!) 104   Temp 99.1 F (37.3 C) (Oral)   Resp 18   Ht 5' 3.5" (1.613 m)   Wt 179 lb (81.2 kg)   SpO2 95%   BMI 31.21 kg/m   Physical Exam  Constitutional: She is oriented to person, place, and time and well-developed, well-nourished, and in no distress. No distress.  HENT:  Right Ear: Tympanic membrane normal.  Left Ear: Tympanic membrane normal.  Mouth/Throat: Oropharynx is clear and moist.  Cardiovascular: Normal rate, regular rhythm and normal heart sounds.   Pulmonary/Chest: Effort normal. She has no wheezes. She has no rhonchi. She has no rales.  Neurological: She is alert and oriented to person, place, and time. GCS score is 15.  Skin: Skin is warm and dry.  Psychiatric: Mood, memory, affect and judgment normal.  Vitals reviewed.   Assessment and Plan :  1. Cough 2. Nasal congestion 3. Viral illness 4. History of smoking 30 or more pack years - benzonatate (TESSALON) 100 MG capsule; Take 1-2 capsules (100-200 mg total) by mouth 3 (three) times daily as needed for cough.  Dispense: 40 capsule; Refill: 0 - Dextromethorphan-Guaifenesin (MUCINEX DM MAXIMUM STRENGTH) 60-1200 MG TB12; Take 1 tablet by mouth every 12 (twelve) hours.  Dispense: 14 each; Refill: 1 - fluticasone (FLONASE) 50 MCG/ACT nasal spray; Place 2 sprays into both nostrils daily.  Dispense: 16 g; Refill: 12 - Supportive therapy encouraged: Fluids, rest, nasal rinse and smoking cessation. She will call or  RTC in 4-6 days if no improvement or if her symptoms worsen.   Mercer Pod, PA-C  [facility] 05/15/2016 9:19 AM

## 2016-05-16 ENCOUNTER — Telehealth: Payer: Self-pay | Admitting: Family Medicine

## 2016-05-16 NOTE — Telephone Encounter (Signed)
Pt calling to let us know that she's running a fever wheezing and coughing stating that [place] said to call back if not better that she will send a Z pack to pharmacy please call pt when done

## 2016-05-17 MED ORDER — AZITHROMYCIN 250 MG PO TABS: ORAL_TABLET | ORAL | 0 refills | Status: DC

## 2016-05-17 NOTE — Telephone Encounter (Signed)
Her paper work indicates if not better by Monday call for Zpack but she states her cough is worsening she has walked in today and is demanding zpack
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
## 2016-05-17 NOTE — Telephone Encounter (Signed)
Note from 05/15/2016 reviewed. Patient rude to clerical and clinical staff.  Antibiotic appropriate at this time.  Meds ordered this encounter  Medications  . azithromycin (ZITHROMAX) 250 MG tablet    Sig: Take 2 tabs PO x 1 dose, then 1 tab PO QD x 4 days    Dispense:  6 tablet    Refill:  0    Order Specific Question:   Supervising Provider    Answer:   Brigitte Pulse, EVA N [[id number]]

## 2016-05-19 NOTE — Telephone Encounter (Signed)
This was sent to PA-Wiseman but PA-McVey is the last provider that saw this patient. I will also forward this to Dr. Tamala Julian for her review.

## 2016-05-20 NOTE — Telephone Encounter (Signed)
Thank you :)

## 2016-07-21 ENCOUNTER — Ambulatory Visit (AMBULATORY_SURGERY_CENTER): Payer: Self-pay

## 2016-07-21 VITALS — Ht 63.5 in | Wt 180.2 lb

## 2016-07-21 DIAGNOSIS — Z860100 Personal history of colon polyps, unspecified: Secondary | ICD-10-CM

## 2016-07-21 MED ORDER — SUPREP BOWEL PREP KIT 17.5-3.13-1.6 GM/177ML PO SOLN: 1.0000 | Freq: Once | ORAL | 0 refills | Status: AC

## 2016-07-21 NOTE — Progress Notes (Signed)
No allergies to eggs or soy No past problems with anesthesia No diet meds No home oxygen  Declined emmi 

## 2016-08-04 ENCOUNTER — Encounter: Payer: Medicare Other | Admitting: Gastroenterology

## 2016-09-09 DIAGNOSIS — F3341 Major depressive disorder, recurrent, in partial remission: Secondary | ICD-10-CM | POA: Diagnosis not present

## 2016-09-24 ENCOUNTER — Encounter: Payer: Medicare Other | Admitting: Gastroenterology

## 2016-09-30 DIAGNOSIS — F330 Major depressive disorder, recurrent, mild: Secondary | ICD-10-CM | POA: Diagnosis not present

## 2016-09-30 DIAGNOSIS — R419 Unspecified symptoms and signs involving cognitive functions and awareness: Secondary | ICD-10-CM | POA: Diagnosis not present

## 2016-10-01 DIAGNOSIS — R419 Unspecified symptoms and signs involving cognitive functions and awareness: Secondary | ICD-10-CM | POA: Diagnosis not present

## 2016-10-01 DIAGNOSIS — F330 Major depressive disorder, recurrent, mild: Secondary | ICD-10-CM | POA: Diagnosis not present

## 2016-10-09 DIAGNOSIS — J40 Bronchitis, not specified as acute or chronic: Secondary | ICD-10-CM | POA: Diagnosis not present

## 2016-11-04 ENCOUNTER — Telehealth: Payer: Self-pay | Admitting: *Deleted

## 2016-11-04 ENCOUNTER — Telehealth: Payer: Self-pay | Admitting: Gastroenterology

## 2016-11-04 ENCOUNTER — Ambulatory Visit (AMBULATORY_SURGERY_CENTER): Payer: Self-pay | Admitting: *Deleted

## 2016-11-04 VITALS — Ht 64.0 in | Wt 184.8 lb

## 2016-11-04 DIAGNOSIS — Z86010 Personal history of colonic polyps: Secondary | ICD-10-CM

## 2016-11-04 MED ORDER — NA SULFATE-K SULFATE-MG SULF 17.5-3.13-1.6 GM/177ML PO SOLN: ORAL | 0 refills | Status: DC

## 2016-11-04 NOTE — Telephone Encounter (Signed)
Patient had rescheduled her colonoscopy, and forgot that it was scheduled for Friday.  She can't find her prep instructions, but she didn't have a current pre-visit.  She would have had to reschedule out to July, so She is now coming in today at 5pm for her previsit.

## 2016-11-04 NOTE — Progress Notes (Signed)
Pt denies allergies to eggs or soy products. Denies difficulty with sedation or anesthesia. Denies any diet or weight loss medications. Denies use of supplemental oxygen.  Emmi instructions given for procedure.  

## 2016-11-07 ENCOUNTER — Ambulatory Visit (AMBULATORY_SURGERY_CENTER): Payer: Medicare Other | Admitting: Gastroenterology

## 2016-11-07 ENCOUNTER — Encounter: Payer: Self-pay | Admitting: Gastroenterology

## 2016-11-07 VITALS — BP 118/43 | HR 62 | Temp 98.0°F | Resp 17 | Ht 64.0 in | Wt 184.0 lb

## 2016-11-07 DIAGNOSIS — D125 Benign neoplasm of sigmoid colon: Secondary | ICD-10-CM

## 2016-11-07 DIAGNOSIS — K219 Gastro-esophageal reflux disease without esophagitis: Secondary | ICD-10-CM | POA: Diagnosis not present

## 2016-11-07 DIAGNOSIS — D123 Benign neoplasm of transverse colon: Secondary | ICD-10-CM | POA: Diagnosis not present

## 2016-11-07 DIAGNOSIS — K635 Polyp of colon: Secondary | ICD-10-CM

## 2016-11-07 DIAGNOSIS — K5730 Diverticulosis of large intestine without perforation or abscess without bleeding: Secondary | ICD-10-CM | POA: Diagnosis not present

## 2016-11-07 DIAGNOSIS — Z86010 Personal history of colonic polyps: Secondary | ICD-10-CM

## 2016-11-07 DIAGNOSIS — R195 Other fecal abnormalities: Secondary | ICD-10-CM | POA: Diagnosis not present

## 2016-11-07 MED ORDER — SODIUM CHLORIDE 0.9 % IV SOLN: 500.0000 mL | INTRAVENOUS | Status: DC

## 2016-11-07 NOTE — Patient Instructions (Signed)
YOU HAD AN ENDOSCOPIC PROCEDURE TODAY AT THE [facility]:   Refer to the procedure report that was given to you for any specific questions about what was found during the examination.  If the procedure report does not answer your questions, please call your gastroenterologist to clarify.  If you requested that your care partner not be given the details of your procedure findings, then the procedure report has been included in a sealed envelope for you to review at your convenience later.  YOU SHOULD EXPECT: Some feelings of bloating in the abdomen. Passage of more gas than usual.  Walking can help get rid of the air that was put into your GI tract during the procedure and reduce the bloating. If you had a lower endoscopy (such as a colonoscopy or flexible sigmoidoscopy) you may notice spotting of blood in your stool or on the toilet paper. If you underwent a bowel prep for your procedure, you may not have a normal bowel movement for a few days.  Please Note:  You might notice some irritation and congestion in your nose or some drainage.  This is from the oxygen used during your procedure.  There is no need for concern and it should clear up in a day or so.  SYMPTOMS TO REPORT IMMEDIATELY:   Following lower endoscopy (colonoscopy or flexible sigmoidoscopy):  Excessive amounts of blood in the stool  Significant tenderness or worsening of abdominal pains  Swelling of the abdomen that is new, acute  Fever of 100F or higher   For urgent or emergent issues, a gastroenterologist can be reached at any hour by calling [phone]. Please read all handouts given to you by your recovery nurse.   DIET:  We do recommend a small meal at first, but then you may proceed to your regular diet.  Drink plenty of fluids but you should avoid alcoholic beverages for 24 hours.  ACTIVITY:  You should plan to take it easy for the rest of today and you should NOT DRIVE or use heavy machinery until  tomorrow (because of the sedation medicines used during the test).    FOLLOW UP: Our staff will call the number listed on your records the next business day following your procedure to check on you and address any questions or concerns that you may have regarding the information given to you following your procedure. If we do not reach you, we will leave a message.  However, if you are feeling well and you are not experiencing any problems, there is no need to return our call.  We will assume that you have returned to your regular daily activities without incident.  If any biopsies were taken you will be contacted by phone or by letter within the next 1-3 weeks.  Please call us at [phone] if you have not heard about the biopsies in 3 weeks.    SIGNATURES/CONFIDENTIALITY: You and/or your care partner have signed paperwork which will be entered into your electronic medical record.  These signatures attest to the fact that that the information above on your After Visit Summary has been reviewed and is understood.  Full responsibility of the confidentiality of this discharge information lies with you and/or your care-partner.  Thank you for letting us take care of your healthcare needs today. 

## 2016-11-07 NOTE — Op Note (Signed)
[place] Patient Name: Tara Davis Procedure Date: 11/07/2016 7:44 AM MRN: [id number] Endoscopist: Milus Banister , MD Age: 71 Referring MD:  Date of Birth: April 27, 1946 Gender: Female Account #: [id number] Procedure:                Colonoscopy Indications:              High risk colon cancer surveillance: Personal                            history of colonic polyps: Several adenomatous                            polyps 10/2009 colonoscopy (also diverticulosis,                            hemorhoids), largest (1.14mm) piecemeal resected.                            recall at 6 months. repeat colonoscopy 04/2010                            found 3 small adenomas; also chronic loose stools Medicines:                Monitored Anesthesia Care Procedure:                Pre-Anesthesia Assessment:                           - Prior to the procedure, a History and Physical                            was performed, and patient medications and                            allergies were reviewed. The patient's tolerance of                            previous anesthesia was also reviewed. The risks                            and benefits of the procedure and the sedation                            options and risks were discussed with the patient.                            All questions were answered, and informed consent                            was obtained. Prior Anticoagulants: The patient has                            taken no previous anticoagulant or antiplatelet  agents. ASA Grade Assessment: II - A patient with                            mild systemic disease. After reviewing the risks                            and benefits, the patient was deemed in                            satisfactory condition to undergo the procedure.                           After obtaining informed consent, the colonoscope                            was passed under direct  vision. Throughout the                            procedure, the patient's blood pressure, pulse, and                            oxygen saturations were monitored continuously. The                            Colonoscope was introduced through the anus and                            advanced to the the cecum, identified by                            appendiceal orifice and ileocecal valve. The                            colonoscopy was performed without difficulty. The                            patient tolerated the procedure well. The quality                            of the bowel preparation was good. The ileocecal                            valve, appendiceal orifice, and rectum were                            photographed. Scope In: 7:58:05 AM Scope Out: 8:12:47 AM Scope Withdrawal Time: 0 hours 10 minutes 42 seconds  Total Procedure Duration: 0 hours 14 minutes 42 seconds  Findings:                 Four sessile polyps were found in the sigmoid colon                            and transverse colon. The polyps were 2 to 7 mm in  size. These polyps were removed with a cold snare.                            Resection and retrieval were complete.                           Multiple small and large-mouthed diverticula were                            found in the left colon.                           Biopsies for histology were taken with a cold                            forceps from the entire colon for evaluation of                            microscopic colitis.                           The exam was otherwise without abnormality on                            direct and retroflexion views. Complications:            No immediate complications. Estimated blood loss:                            None. Estimated Blood Loss:     Estimated blood loss: none. Impression:               - Four 2 to 7 mm polyps in the sigmoid colon and in                            the  transverse colon, removed with a cold snare.                            Resected and retrieved.                           - Diverticulosis in the left colon.                           - The examination was otherwise normal on direct                            and retroflexion views.                           - Biopsies were taken with a cold forceps from the                            entire colon for evaluation of microscopic colitis. Recommendation:           - Patient has a contact  number available for                            emergencies. The signs and symptoms of potential                            delayed complications were discussed with the                            patient. Return to normal activities tomorrow.                            Written discharge instructions were provided to the                            patient.                           - Resume previous diet.                           - Continue present medications.                           You will receive a letter within 2-3 weeks with the                            pathology results and my final recommendations.                           If the polyp(s) is proven to be 'pre-cancerous' on                            pathology, you will need repeat colonoscopy in 3-5                            years.                           My office will contact your about follow up appt to                            discuss the chronic lower abdoinal pains, loose                            stools you mentioned today. Milus Banister, MD 11/07/2016 8:17:26 AM This report has been signed electronically.

## 2016-11-07 NOTE — Progress Notes (Signed)
Called to room to assist during endoscopic procedure.  Patient ID and intended procedure confirmed with present staff. Received instructions for my participation in the procedure from the performing physician.  

## 2016-11-07 NOTE — Progress Notes (Signed)
Report to PACU, RN, vss, BBS= Clear.  

## 2016-11-10 ENCOUNTER — Telehealth: Payer: Self-pay | Admitting: *Deleted

## 2016-11-10 NOTE — Telephone Encounter (Signed)
Message left

## 2016-11-10 NOTE — Telephone Encounter (Signed)
  Follow up Call-  Call back number 11/07/2016  Post procedure Call Back phone  # [phone]  Permission to leave phone message Yes  Some recent data might be hidden     Patient questions:  Do you have a fever, pain , or abdominal swelling? No. Pain Score  0 *  Have you tolerated food without any problems? Yes.    Have you been able to return to your normal activities? Yes.    Do you have any questions about your discharge instructions: Diet   No. Medications  No. Follow up visit  No.  Do you have questions or concerns about your Care? No.  Actions: * If pain score is 4 or above: No action needed, pain <4.

## 2016-11-10 NOTE — Telephone Encounter (Signed)
  Follow up Call-  Call back number 11/07/2016  Post procedure Call Back phone  # [phone]  Permission to leave phone message Yes  Some recent data might be hidden     No answer, message left by Jayme Cloud.

## 2016-11-15 ENCOUNTER — Encounter: Payer: Self-pay | Admitting: Gastroenterology

## 2016-12-02 ENCOUNTER — Encounter: Payer: Self-pay | Admitting: Gastroenterology

## 2016-12-02 ENCOUNTER — Other Ambulatory Visit (INDEPENDENT_AMBULATORY_CARE_PROVIDER_SITE_OTHER): Payer: Medicare Other

## 2016-12-02 ENCOUNTER — Ambulatory Visit (INDEPENDENT_AMBULATORY_CARE_PROVIDER_SITE_OTHER): Payer: Medicare Other | Admitting: Gastroenterology

## 2016-12-02 VITALS — BP 112/62 | Ht 63.5 in | Wt 188.0 lb

## 2016-12-02 DIAGNOSIS — R195 Other fecal abnormalities: Secondary | ICD-10-CM

## 2016-12-02 DIAGNOSIS — R102 Pelvic and perineal pain: Secondary | ICD-10-CM | POA: Diagnosis not present

## 2016-12-02 LAB — IGA: IgA: 113 mg/dL (ref 68–378)

## 2016-12-02 LAB — HEPATIC FUNCTION PANEL
ALT: 21 U/L (ref 0–35)
AST: 15 U/L (ref 0–37)
Albumin: 4.4 g/dL (ref 3.5–5.2)
Alkaline Phosphatase: 66 U/L (ref 39–117)
BILIRUBIN TOTAL: 0.6 mg/dL (ref 0.2–1.2)
Bilirubin, Direct: 0.1 mg/dL (ref 0.0–0.3)
Total Protein: 6.8 g/dL (ref 6.0–8.3)

## 2016-12-02 NOTE — Patient Instructions (Addendum)
Please go to the basement level to have your labs drawn.  Take Imodium to manage diarrhea. No more than 4 doses in a day.   You can call the GYN MD for an appointment. We have provided you with  FODMAP diet.   If you are age 70 or older, your body mass index should be between 23-30. Your Body mass index is 32.78 kg/m. If this is out of the aforementioned range listed, please consider follow up with your Primary Care Provider.

## 2016-12-02 NOTE — Progress Notes (Signed)
12/02/2016 Tara Davis [id number] 30-Aug-1945   HISTORY OF PRESENT ILLNESS:  This is a pleasant 71 year old female who is known to Dr. Ardis Hughs for her colonoscopies. Her last colonoscopy was just earlier this month at which time there were multiple polyps removed as well as a finding of multiple small and large mouth diverticula in the left colon. Biopsies were performed to rule out microscopic colitis, and were negative for such. She presents to our office today with ongoing complaints of loose stools and lower abdominal/pelvic pain. She admits that both of these symptoms have been ongoing for years and is wondering if there is any other evaluation or treatment for them. She is currently using Imodium and says that it does work she was not sure if she should use it ongoingly. She does not have a gallbladder. Her pain is very low in the pelvis for the most part, but she also complains of recent bloating that cause discomfort throughout her abdomen and even up under her ribcage at times. She's not seen a gynecologist in several years. She says that previously she went on a gluten-free diet for period of time and her loose stools did seem to get much better while she was doing that.  She thinks that she tried [place] in the past without any improvement in symptoms.  She says that she is still working and does stress herself out from work.  She says that when she goes out of town her symptoms get significantly better. Stool seemed to be mostly in the mornings, several times, but tapers off significantly throughout the day. She has been undergoing treatment for depression most of her life.    Past Medical History:  Diagnosis Date  . Anxiety   . Bilateral cataracts    within 6 mos.  . Depression   . GERD (gastroesophageal reflux disease)   . Hyperlipidemia   . Ulcer 1988   Past Surgical History:  Procedure Laterality Date  . APPENDECTOMY    . CATARACT EXTRACTION, BILATERAL    .  CHOLECYSTECTOMY    . COLONOSCOPY    . spontaneous vaginal delivery     x2  . TONSILLECTOMY    . WISDOM TOOTH EXTRACTION      reports that she has been smoking Cigarettes.  She has a 39.75 pack-year smoking history. She has never used smokeless tobacco. She reports that she drinks about 8.4 oz of alcohol per week . She reports that she does not use drugs. family history includes Alzheimer's disease in her mother; Breast cancer in her sister; Depression in her father; Diabetes in her father. Allergies  Allergen Reactions  . Codeine     REACTION: hallucinations      Outpatient Encounter Prescriptions as of 12/02/2016  Medication Sig  . atorvastatin (LIPITOR) 20 MG tablet Take 20 mg by mouth daily.  . benzonatate (TESSALON) 100 MG capsule Take 1-2 capsules (100-200 mg total) by mouth 3 (three) times daily as needed for cough.  . fluticasone (FLONASE) 50 MCG/ACT nasal spray Place 2 sprays into both nostrils daily.  . Loperamide HCl (IMODIUM PO) Take by mouth daily.   Marland Kitchen LORazepam (ATIVAN) 0.5 MG tablet Take 0.5 mg by mouth 2 (two) times daily.   Marland Kitchen ofloxacin (OCUFLOX) 0.3 % ophthalmic solution Place 2 drops into the left eye every 2 (two) hours.  Marland Kitchen omeprazole (PRILOSEC) 40 MG capsule Take 40 mg by mouth daily.  . sertraline (ZOLOFT) 50 MG tablet Take 100 mg by mouth daily.   . [  DISCONTINUED] 0.9 %  sodium chloride infusion    No facility-administered encounter medications on file as of 12/02/2016.      REVIEW OF SYSTEMS  : All other systems reviewed and negative except where noted in the History of Present Illness.   PHYSICAL EXAM: BP 112/62   Ht 5' 3.5" (1.613 m)   Wt 188 lb (85.3 kg)   BMI 32.78 kg/m  General: Well developed white female in no acute distress Head: Normocephalic and atraumatic Eyes:  Sclerae anicteric, conjunctiva pink. Ears: Normal auditory acuity Lungs: Clear throughout to auscultation; no increased WOB. Heart: Regular rate and rhythm Abdomen: Soft,  non-distended. Normal bowel sounds.  Non-tender. Musculoskeletal: Symmetrical with no gross deformities  Skin: No lesions on visible extremities Extremities: No edema  Neurological: Alert oriented x 4, grossly non-focal Psychological:  Alert and cooperative. Normal mood and affect  ASSESSMENT AND PLAN: -Chronic loose stools:  Likely bile salt related and/or IBS.  Will check celiac labs as symptoms seemed better on a gluten free diet in the past.  Will also give information on a FODMAP diet.  Can use imodium to manage her symptoms. -Chronic lower abdominal/pelvic pain:  No GI source identified.  Has not seen GYN in years.  Will make an appt to be evaluated from this standpoint.   CC:  Glendale Chard, MD

## 2016-12-03 NOTE — Progress Notes (Signed)
I agree with the above note, plan 

## 2016-12-04 LAB — TISSUE TRANSGLUTAMINASE, IGG: TISSUE TRANSGLUT AB: 1 U/mL (ref <6)

## 2016-12-05 DIAGNOSIS — F3341 Major depressive disorder, recurrent, in partial remission: Secondary | ICD-10-CM | POA: Diagnosis not present

## 2016-12-08 DIAGNOSIS — E559 Vitamin D deficiency, unspecified: Secondary | ICD-10-CM | POA: Diagnosis not present

## 2016-12-08 DIAGNOSIS — E785 Hyperlipidemia, unspecified: Secondary | ICD-10-CM | POA: Diagnosis not present

## 2016-12-08 DIAGNOSIS — Z79899 Other long term (current) drug therapy: Secondary | ICD-10-CM | POA: Diagnosis not present

## 2016-12-08 DIAGNOSIS — R7309 Other abnormal glucose: Secondary | ICD-10-CM | POA: Diagnosis not present

## 2016-12-08 DIAGNOSIS — R197 Diarrhea, unspecified: Secondary | ICD-10-CM | POA: Diagnosis not present

## 2016-12-08 DIAGNOSIS — Z0000 Encounter for general adult medical examination without abnormal findings: Secondary | ICD-10-CM | POA: Diagnosis not present

## 2017-01-07 ENCOUNTER — Ambulatory Visit: Payer: Medicare Other | Admitting: Gastroenterology

## 2017-01-27 DIAGNOSIS — Z01419 Encounter for gynecological examination (general) (routine) without abnormal findings: Secondary | ICD-10-CM | POA: Diagnosis not present

## 2017-01-27 DIAGNOSIS — R1031 Right lower quadrant pain: Secondary | ICD-10-CM | POA: Diagnosis not present

## 2017-01-27 DIAGNOSIS — Z124 Encounter for screening for malignant neoplasm of cervix: Secondary | ICD-10-CM | POA: Diagnosis not present

## 2017-02-02 DIAGNOSIS — Z23 Encounter for immunization: Secondary | ICD-10-CM | POA: Diagnosis not present

## 2017-03-24 DIAGNOSIS — R1031 Right lower quadrant pain: Secondary | ICD-10-CM | POA: Diagnosis not present

## 2017-04-05 DIAGNOSIS — J22 Unspecified acute lower respiratory infection: Secondary | ICD-10-CM | POA: Diagnosis not present

## 2017-04-06 ENCOUNTER — Ambulatory Visit: Payer: Medicare Other | Admitting: Physician Assistant

## 2017-04-14 DIAGNOSIS — J209 Acute bronchitis, unspecified: Secondary | ICD-10-CM | POA: Diagnosis not present

## 2017-04-15 ENCOUNTER — Ambulatory Visit
Admission: RE | Admit: 2017-04-15 | Discharge: 2017-04-15 | Disposition: A | Discharge status: Home / Self Care | Payer: Medicare Other | Source: Ambulatory Visit | Attending: Internal Medicine | Admitting: Internal Medicine

## 2017-04-15 ENCOUNTER — Other Ambulatory Visit: Payer: Self-pay | Admitting: Internal Medicine

## 2017-04-15 DIAGNOSIS — R05 Cough: Secondary | ICD-10-CM | POA: Diagnosis not present

## 2017-04-15 DIAGNOSIS — R059 Cough, unspecified: Secondary | ICD-10-CM

## 2017-05-04 DIAGNOSIS — F3341 Major depressive disorder, recurrent, in partial remission: Secondary | ICD-10-CM | POA: Diagnosis not present

## 2017-05-05 DIAGNOSIS — R0981 Nasal congestion: Secondary | ICD-10-CM | POA: Diagnosis not present

## 2017-05-05 DIAGNOSIS — R05 Cough: Secondary | ICD-10-CM | POA: Diagnosis not present

## 2017-05-05 DIAGNOSIS — J069 Acute upper respiratory infection, unspecified: Secondary | ICD-10-CM | POA: Diagnosis not present

## 2017-05-05 DIAGNOSIS — J40 Bronchitis, not specified as acute or chronic: Secondary | ICD-10-CM | POA: Diagnosis not present

## 2017-05-05 DIAGNOSIS — J302 Other seasonal allergic rhinitis: Secondary | ICD-10-CM | POA: Diagnosis not present

## 2017-08-03 DIAGNOSIS — F3341 Major depressive disorder, recurrent, in partial remission: Secondary | ICD-10-CM | POA: Diagnosis not present

## 2017-08-18 DIAGNOSIS — F331 Major depressive disorder, recurrent, moderate: Secondary | ICD-10-CM | POA: Diagnosis not present

## 2017-08-27 DIAGNOSIS — F331 Major depressive disorder, recurrent, moderate: Secondary | ICD-10-CM | POA: Diagnosis not present

## 2017-09-01 DIAGNOSIS — F331 Major depressive disorder, recurrent, moderate: Secondary | ICD-10-CM | POA: Diagnosis not present

## 2017-09-07 DIAGNOSIS — F331 Major depressive disorder, recurrent, moderate: Secondary | ICD-10-CM | POA: Diagnosis not present

## 2017-09-16 ENCOUNTER — Encounter: Payer: Self-pay | Admitting: Physician Assistant

## 2017-09-21 DIAGNOSIS — F331 Major depressive disorder, recurrent, moderate: Secondary | ICD-10-CM | POA: Diagnosis not present

## 2017-10-22 DIAGNOSIS — R05 Cough: Secondary | ICD-10-CM | POA: Diagnosis not present

## 2017-10-22 DIAGNOSIS — J209 Acute bronchitis, unspecified: Secondary | ICD-10-CM | POA: Diagnosis not present

## 2017-10-26 DIAGNOSIS — J209 Acute bronchitis, unspecified: Secondary | ICD-10-CM | POA: Diagnosis not present

## 2017-10-26 DIAGNOSIS — F17200 Nicotine dependence, unspecified, uncomplicated: Secondary | ICD-10-CM | POA: Diagnosis not present

## 2017-11-05 ENCOUNTER — Other Ambulatory Visit: Payer: Self-pay | Admitting: Internal Medicine

## 2017-11-05 DIAGNOSIS — J209 Acute bronchitis, unspecified: Secondary | ICD-10-CM

## 2017-11-17 ENCOUNTER — Ambulatory Visit: Payer: Medicare Other

## 2017-11-26 ENCOUNTER — Ambulatory Visit
Admission: RE | Admit: 2017-11-26 | Discharge: 2017-11-26 | Disposition: A | Discharge status: Home / Self Care | Payer: Medicare Other | Source: Ambulatory Visit | Attending: Internal Medicine | Admitting: Internal Medicine

## 2017-11-26 DIAGNOSIS — J209 Acute bronchitis, unspecified: Secondary | ICD-10-CM

## 2018-01-06 DIAGNOSIS — E785 Hyperlipidemia, unspecified: Secondary | ICD-10-CM | POA: Diagnosis not present

## 2018-01-06 DIAGNOSIS — K580 Irritable bowel syndrome with diarrhea: Secondary | ICD-10-CM | POA: Diagnosis not present

## 2018-01-06 DIAGNOSIS — E559 Vitamin D deficiency, unspecified: Secondary | ICD-10-CM | POA: Diagnosis not present

## 2018-01-06 DIAGNOSIS — R7309 Other abnormal glucose: Secondary | ICD-10-CM | POA: Diagnosis not present

## 2018-01-06 DIAGNOSIS — Z79899 Other long term (current) drug therapy: Secondary | ICD-10-CM | POA: Diagnosis not present

## 2018-01-12 DIAGNOSIS — F17210 Nicotine dependence, cigarettes, uncomplicated: Secondary | ICD-10-CM | POA: Insufficient documentation

## 2018-01-12 DIAGNOSIS — Z720 Tobacco use: Secondary | ICD-10-CM | POA: Insufficient documentation

## 2018-01-12 NOTE — Progress Notes (Signed)
Synopsis: Referred in August 2019 for chronic cough/COPD.  Subjective:   PATIENT ID: Tara Davis GENDER: female DOB: 07/16/1945, MRN: [id number]  Chief Complaint  Patient presents with  . pulmonary consult    referred by Dr. Baird Cancer for chronic cough.     The patient usually has one to two URI events with prolonged recover. She currently smokes and she knows that she shouldn't. She has cut back to <1 ppd and has smoked for 45 years. She had a really bad event this past fall and it scared. 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   Cough: Patient complains of productive cough.  Symptoms began 2-3 years ago.  The cough is productive of green/yellow sputum and is aggravated by exercise, fumes and infection Associated symptoms include:heartburn and shortness of breath. Patient does have a bird a cockatiel named Jake for the past 20 years. Recent CXR imaging this past fall.   The patient complains of shortness of breath with exertion and cough with exercise.  She is able to climb approximately 22 stairs at work daily but is slow with it.    Past Medical History:  Diagnosis Date  . Anxiety   . Bilateral cataracts    within 6 mos.  . Depression   . GERD (gastroesophageal reflux disease)   . Hyperlipidemia   . Ulcer 1988     Family History  Problem Relation Age of Onset  . Alzheimer's disease Mother   . Depression Father   . Diabetes Father   . Breast cancer Sister   . Colon cancer Neg Hx      Social History   Socioeconomic History  . Marital status: Single    Spouse name: Not on file  . Number of children: Not on file  . Years of education: Not on file  . Highest education level: Not on file  Occupational History  . Not on file  Social Needs  . Financial resource strain: Not on file  . Food insecurity:    Worry: Not on file    Inability: Not on file  . Transportation needs:   Medical: Not on file    Non-medical: Not on file  Tobacco Use  . Smoking status: Current Every Day Smoker    Packs/day: 0.75    Years: 53.00    Pack years: 39.75    Types: Cigarettes  . Smokeless tobacco: Never Used  Substance and Sexual Activity  . Alcohol use: Yes    Alcohol/week: 8.4 oz    Types: 14 Shots of liquor per week  . Drug use: No  . Sexual activity: Yes  Lifestyle  . Physical activity:    Days per week: Not on file    Minutes per session: Not on file  . Stress: Not on file  Relationships  . Social connections:    Talks on phone: Not on file    Gets together: Not on file    Attends religious service: Not on file    Active member of club or organization: Not on file    Attends meetings of clubs or organizations: Not on file    Relationship status: Not on file  . Intimate partner violence:    Fear of current or ex partner: Not on file    Emotionally abused: Not on file    Physically abused: Not on file    Forced sexual activity:  Not on file  Other Topics Concern  . Not on file  Social History Narrative  . Not on file     Allergies  Allergen Reactions  . Codeine     REACTION: hallucinations     Outpatient Medications Prior to Visit  Medication Sig Dispense Refill  . atorvastatin (LIPITOR) 20 MG tablet Take 20 mg by mouth daily.    . benzonatate (TESSALON) 100 MG capsule Take 1-2 capsules (100-200 mg total) by mouth 3 (three) times daily as needed for cough. 40 capsule 0  . fluticasone (FLONASE) 50 MCG/ACT nasal spray Place 2 sprays into both nostrils daily. 16 g 12  . Loperamide HCl (IMODIUM PO) Take by mouth daily.     Marland Kitchen LORazepam (ATIVAN) 0.5 MG tablet Take 0.5 mg by mouth 2 (two) times daily.     Marland Kitchen omeprazole (PRILOSEC) 40 MG capsule Take 40 mg by mouth daily.    . sertraline (ZOLOFT) 50 MG tablet Take 100 mg by mouth daily.     Marland Kitchen ofloxacin (OCUFLOX) 0.3 % ophthalmic solution Place 2 drops into the left eye every 2 (two) hours. 10 mL 0   No  facility-administered medications prior to visit.     Review of Systems  Constitutional: Negative.   HENT: Negative.   Eyes: Negative.   Respiratory: Positive for cough, shortness of breath and wheezing.   Cardiovascular: Negative.   Gastrointestinal: Negative.   Genitourinary: Negative.   Musculoskeletal: Negative.   Skin: Negative.   Neurological: Negative.   Endo/Heme/Allergies: Negative.   Psychiatric/Behavioral: Negative.     Objective:  Physical Exam  Constitutional: She is oriented to person, place, and time. She appears well-developed and well-nourished. No distress.  HENT:  Head: Normocephalic and atraumatic.  Mouth/Throat: Oropharynx is clear and moist. No oropharyngeal exudate.  Eyes: Pupils are equal, round, and reactive to light. Conjunctivae and EOM are normal. No scleral icterus.  Neck: Neck supple. No JVD present. No tracheal deviation present.  Loss of supraclavicular fat  Cardiovascular: Normal rate, regular rhythm, S1 normal, S2 normal, normal heart sounds and intact distal pulses.  No murmur heard. Distant heart tones  Pulmonary/Chest: Effort normal. No accessory muscle usage or stridor. No tachypnea. No respiratory distress. She has decreased breath sounds (throughout all lung fields). She has no wheezes. She has no rhonchi. She has no rales.  Increased AP chest diameter  Abdominal: Soft. Bowel sounds are normal. She exhibits no distension. There is no tenderness.  Musculoskeletal: She exhibits no edema, tenderness or deformity.  Lymphadenopathy:    She has no cervical adenopathy.  Neurological: She is alert and oriented to person, place, and time.  Skin: Skin is warm and dry. Capillary refill takes less than 2 seconds. No rash noted.  Psychiatric: She has a normal mood and affect. Her behavior is normal.  Vitals reviewed.    Vitals:   01/13/18 0904  BP: 128/70  Pulse: 73  SpO2: 96%  Weight: 186 lb 9.6 oz (84.6 kg)  Height: 5\' 4"  (1.626 m)   96%  on RA BMI Readings from Last 3 Encounters:  01/13/18 32.03 kg/m  12/02/16 32.78 kg/m  11/07/16 31.58 kg/m   Wt Readings from Last 3 Encounters:  01/13/18 186 lb 9.6 oz (84.6 kg)  12/02/16 188 lb (85.3 kg)  11/07/16 184 lb (83.5 kg)   CBC    Component Value Date/Time   WBC 9.2 08/22/2015 1234   RBC 4.54 08/22/2015 1234   HGB 15.1 (H) 08/22/2015 1234   HCT  44.1 08/22/2015 1234   PLT 323.0 08/22/2015 1234   MCV 97.0 08/22/2015 1234   MCV 102.6 (A) 01/24/2013 1631   MCH 33.5 12/17/2014 1542   MCHC 34.2 08/22/2015 1234   RDW 13.3 08/22/2015 1234   LYMPHSABS 3.1 08/22/2015 1234   MONOABS 0.5 08/22/2015 1234   EOSABS 0.2 08/22/2015 1234   BASOSABS 0.0 08/22/2015 1234   Chest Imaging: 11/2017 - LDCT Chest imaging reviewed myself   IMPRESSION: 1. Lung-RADS 2, benign appearance or behavior. Continue annual screening with low-dose chest CT without contrast in 12 months. 2. Mild diffuse bronchial wall thickening with mild centrilobular and paraseptal emphysema; imaging findings suggestive of underlying COPD. 3. Aortic atherosclerosis. 4. Mild hepatic steatosis.  Pulmonary Functions Testing Results: No results found for: FEV1, FVC, FEV1FVC, TLC, DLCO  FeNO: None   Pathology: None   Echocardiogram: None   Heart Catheterization: None     Assessment & Plan:   Centrilobular and paraseptal emphysema ([place]) - 11/2017 - Seen on LDCT  - Plan: Pulmonary Function Test, 6 minute walk  Tobacco abuse - Plan: Ambulatory Referral for Lung Cancer Scre  Cigarette smoker  Cough  SOB (shortness of breath)  DOE (dyspnea on exertion)  Discussion:  We will get full PFTS with a 6MWT for baseline function. Should continue LDCT annual screening. We will enroll you our shared-decision making screening program for lung cancer screening here in the office.  We will give her samples of Spiriva today in the office as well as prescription. Continue PRN albuterol for shortness of breath  and cough. She should start 2000 units vitamin D supplement daily She was counseled on smoking cessation today in the office. Given recommendations for over-the-counter nicotine supplement via gum or patch.  Once PFTs are completed may benefit pulmonary rehab. Return to clinic in 4 to 6 weeks once PFTs completed    Current Outpatient Medications:  .  atorvastatin (LIPITOR) 20 MG tablet, Take 20 mg by mouth daily., Disp: , Rfl:  .  benzonatate (TESSALON) 100 MG capsule, Take 1-2 capsules (100-200 mg total) by mouth 3 (three) times daily as needed for cough., Disp: 40 capsule, Rfl: 0 .  fluticasone (FLONASE) 50 MCG/ACT nasal spray, Place 2 sprays into both nostrils daily., Disp: 16 g, Rfl: 12 .  Loperamide HCl (IMODIUM PO), Take by mouth daily. , Disp: , Rfl:  .  LORazepam (ATIVAN) 0.5 MG tablet, Take 0.5 mg by mouth 2 (two) times daily. , Disp: , Rfl:  .  omeprazole (PRILOSEC) 40 MG capsule, Take 40 mg by mouth daily., Disp: , Rfl:  .  sertraline (ZOLOFT) 50 MG tablet, Take 100 mg by mouth daily. , Disp: , Rfl:  .  Tiotropium Bromide Monohydrate (SPIRIVA RESPIMAT) 2.5 MCG/ACT AERS, Inhale 2 puffs into the lungs daily., Disp: 1 Inhaler, Rfl: 0 .  Tiotropium Bromide Monohydrate (SPIRIVA RESPIMAT) 2.5 MCG/ACT AERS, Inhale 2 puffs into the lungs daily., Disp: 1 Inhaler, Rfl: 3   Garner Nash, DO [facility] 01/13/2018 9:47 AM

## 2018-01-13 ENCOUNTER — Ambulatory Visit: Payer: Medicare Other | Admitting: Pulmonary Disease

## 2018-01-13 ENCOUNTER — Encounter: Payer: Self-pay | Admitting: Pulmonary Disease

## 2018-01-13 VITALS — BP 128/70 | HR 73 | Ht 64.0 in | Wt 186.6 lb

## 2018-01-13 DIAGNOSIS — J432 Centrilobular emphysema: Secondary | ICD-10-CM | POA: Diagnosis not present

## 2018-01-13 DIAGNOSIS — R059 Cough, unspecified: Secondary | ICD-10-CM

## 2018-01-13 DIAGNOSIS — R05 Cough: Secondary | ICD-10-CM | POA: Diagnosis not present

## 2018-01-13 DIAGNOSIS — Z720 Tobacco use: Secondary | ICD-10-CM

## 2018-01-13 DIAGNOSIS — F17210 Nicotine dependence, cigarettes, uncomplicated: Secondary | ICD-10-CM | POA: Diagnosis not present

## 2018-01-13 DIAGNOSIS — R0600 Dyspnea, unspecified: Secondary | ICD-10-CM

## 2018-01-13 DIAGNOSIS — R0609 Other forms of dyspnea: Secondary | ICD-10-CM

## 2018-01-13 DIAGNOSIS — R0602 Shortness of breath: Secondary | ICD-10-CM

## 2018-01-13 MED ORDER — TIOTROPIUM BROMIDE MONOHYDRATE 2.5 MCG/ACT IN AERS
2.0000 | INHALATION_SPRAY | Freq: Every day | RESPIRATORY_TRACT | 3 refills | Status: DC
Start: 2018-01-13 — End: 2021-09-30

## 2018-01-13 MED ORDER — TIOTROPIUM BROMIDE MONOHYDRATE 2.5 MCG/ACT IN AERS: 2.0000 | INHALATION_SPRAY | Freq: Every day | RESPIRATORY_TRACT | 0 refills | Status: DC

## 2018-01-13 NOTE — Patient Instructions (Signed)
We are going to FULL PFTS with 6MWT. Will enroll in LDCT screening program, last LDCT was June 2019. Will give sample of spirva respimat sample today.  Start OTC vitamin D supplement 2000IU daily

## 2018-02-11 ENCOUNTER — Ambulatory Visit (INDEPENDENT_AMBULATORY_CARE_PROVIDER_SITE_OTHER): Payer: Medicare Other | Admitting: Pulmonary Disease

## 2018-02-11 DIAGNOSIS — J432 Centrilobular emphysema: Secondary | ICD-10-CM | POA: Diagnosis not present

## 2018-02-11 LAB — PULMONARY FUNCTION TEST
DL/VA % pred: 77 %
DL/VA: 3.63 ml/min/mmHg/L
DLCO UNC % PRED: 71 %
DLCO unc: 16.43 ml/min/mmHg
FEF 25-75 PRE: 1.5 L/s
FEF 25-75 Post: 2.13 L/sec
FEF2575-%CHANGE-POST: 41 %
FEF2575-%PRED-POST: 119 %
FEF2575-%Pred-Pre: 84 %
FEV1-%CHANGE-POST: 10 %
FEV1-%PRED-POST: 100 %
FEV1-%PRED-PRE: 90 %
FEV1-POST: 2.13 L
FEV1-PRE: 1.93 L
FEV1FVC-%CHANGE-POST: 3 %
FEV1FVC-%Pred-Pre: 99 %
FEV6-%CHANGE-POST: 7 %
FEV6-%PRED-POST: 100 %
FEV6-%PRED-PRE: 93 %
FEV6-PRE: 2.53 L
FEV6-Post: 2.72 L
FEV6FVC-%Change-Post: 0 %
FEV6FVC-%PRED-PRE: 104 %
FEV6FVC-%Pred-Post: 105 %
FVC-%CHANGE-POST: 6 %
FVC-%PRED-POST: 96 %
FVC-%Pred-Pre: 90 %
FVC-Post: 2.72 L
FVC-Pre: 2.55 L
POST FEV6/FVC RATIO: 100 %
PRE FEV6/FVC RATIO: 99 %
Post FEV1/FVC ratio: 78 %
Pre FEV1/FVC ratio: 76 %
RV % pred: 101 %
RV: 2.19 L
TLC % pred: 94 %
TLC: 4.65 L

## 2018-02-11 NOTE — Progress Notes (Signed)
PFT done today. 

## 2018-02-17 ENCOUNTER — Ambulatory Visit: Payer: Medicare Other | Admitting: Pulmonary Disease

## 2018-02-17 ENCOUNTER — Ambulatory Visit: Payer: Medicare Other

## 2018-02-25 DIAGNOSIS — F331 Major depressive disorder, recurrent, moderate: Secondary | ICD-10-CM | POA: Diagnosis not present

## 2018-02-28 NOTE — Progress Notes (Signed)
Synopsis: Referred in August 2019 for chronic cough/COPD.  Subjective:   PATIENT ID: Tara Davis GENDER: female DOB: 04-28-1946, MRN: [id number]  Chief Complaint  Patient presents with  . Follow-up    States her breathing has been better, she thinks the vitamin D has helped. Spiriva has a burning sensation, stopped using it in august.    Initial office visit:  The patient usually has one to two URI events with prolonged recover. She currently smokes and she knows that she shouldn't. She has cut back to <1 ppd and has smoked for 45 years. She had a really bad event this past fall and it scared. 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   Cough: Patient complains of productive cough.  Symptoms began 2-3 years ago.  The cough is productive of green/yellow sputum and is aggravated by exercise, fumes and infection Associated symptoms include:heartburn and shortness of breath. Patient does have a bird a cockatiel named Jake for the past 20 years. Recent CXR imaging this past fall.   The patient complains of shortness of breath with exertion and cough with exercise.  She is able to climb approximately 22 stairs at work daily but is slow with it.   OV 03/01/2018: Since her last visit patient has been doing better.  She did start vitamin D supplementation which she believes has made a big difference.  She does not really like using the inhaler and has not been very compliant with that and does not see a significant difference in her symptoms.  She has been able to decrease her cigarettes down to 5 to 7/day.  She is working on quitting but has a significant amount of emotional, anxiety related to quitting.  She is working with her therapist regarding this.  She does have family visiting soon she is going to use the opportunity to attempt to use nicotine replacement either with a patch or gum over-the-counter.  She  did have several questions today regarding vaping she denies vaping to me at this time but she says she does have several friends that have picked up vaping and are also using cannabis substitutes in the vaping solutions.  Past Medical History:  Diagnosis Date  . Anxiety   . Bilateral cataracts    within 6 mos.  . Depression   . GERD (gastroesophageal reflux disease)   . Hyperlipidemia   . Ulcer 1988     Family History  Problem Relation Age of Onset  . Alzheimer's disease Mother   . Depression Father   . Diabetes Father   . Breast cancer Sister   . Colon cancer Neg Hx     Past Surgical History:  Procedure Laterality Date  . APPENDECTOMY    . CATARACT EXTRACTION, BILATERAL    . CHOLECYSTECTOMY    . COLONOSCOPY    . spontaneous vaginal delivery     x2  . TONSILLECTOMY    . WISDOM TOOTH EXTRACTION      Social History   Socioeconomic History  . Marital status: Single    Spouse name: Not on file  . Number of children: Not on file  . Years of education: Not on file  . Highest education level: Not on file  Occupational History  . Not on file  Social Needs  . Financial resource strain: Not on file  . Food insecurity:    Worry: Not on  file    Inability: Not on file  . Transportation needs:    Medical: Not on file    Non-medical: Not on file  Tobacco Use  . Smoking status: Current Every Day Smoker    Packs/day: 0.75    Years: 53.00    Pack years: 39.75    Types: Cigarettes  . Smokeless tobacco: Never Used  . Tobacco comment: smokes 5-7 cigarettes/day   Substance and Sexual Activity  . Alcohol use: Yes    Alcohol/week: 14.0 standard drinks    Types: 14 Shots of liquor per week  . Drug use: No  . Sexual activity: Yes  Lifestyle  . Physical activity:    Days per week: Not on file    Minutes per session: Not on file  . Stress: Not on file  Relationships  . Social connections:    Talks on phone: Not on file    Gets together: Not on file    Attends religious  service: Not on file    Active member of club or organization: Not on file    Attends meetings of clubs or organizations: Not on file    Relationship status: Not on file  . Intimate partner violence:    Fear of current or ex partner: Not on file    Emotionally abused: Not on file    Physically abused: Not on file    Forced sexual activity: Not on file  Other Topics Concern  . Not on file  Social History Narrative  . Not on file     Allergies  Allergen Reactions  . Codeine     REACTION: hallucinations     Outpatient Medications Prior to Visit  Medication Sig Dispense Refill  . atorvastatin (LIPITOR) 20 MG tablet Take 20 mg by mouth daily.    . benzonatate (TESSALON) 100 MG capsule Take 1-2 capsules (100-200 mg total) by mouth 3 (three) times daily as needed for cough. 40 capsule 0  . fluticasone (FLONASE) 50 MCG/ACT nasal spray Place 2 sprays into both nostrils daily. 16 g 12  . Loperamide HCl (IMODIUM PO) Take by mouth daily.     Marland Kitchen LORazepam (ATIVAN) 0.5 MG tablet Take 0.5 mg by mouth 2 (two) times daily.     Marland Kitchen omeprazole (PRILOSEC) 40 MG capsule Take 40 mg by mouth daily.    . sertraline (ZOLOFT) 50 MG tablet Take 100 mg by mouth daily.     . Tiotropium Bromide Monohydrate (SPIRIVA RESPIMAT) 2.5 MCG/ACT AERS Inhale 2 puffs into the lungs daily. 1 Inhaler 3  . Tiotropium Bromide Monohydrate (SPIRIVA RESPIMAT) 2.5 MCG/ACT AERS Inhale 2 puffs into the lungs daily. (Patient not taking: Reported on 03/01/2018) 1 Inhaler 0   No facility-administered medications prior to visit.     Review of Systems  Constitutional: Negative.   HENT: Negative.   Eyes: Negative.   Respiratory: Negative for cough, hemoptysis, sputum production, shortness of breath and wheezing.   Cardiovascular: Negative.   Gastrointestinal: Negative.   Genitourinary: Negative.   Musculoskeletal: Negative.   Skin: Negative.   Neurological: Negative.   Endo/Heme/Allergies: Negative.   Psychiatric/Behavioral:  Negative.     Objective:  Physical Exam  Constitutional: She is oriented to person, place, and time. She appears well-developed and well-nourished. No distress.  HENT:  Head: Normocephalic and atraumatic.  Mouth/Throat: Oropharynx is clear and moist. No oropharyngeal exudate.  Eyes: Pupils are equal, round, and reactive to light. Conjunctivae and EOM are normal. No scleral icterus.  Neck: Neck supple.  No JVD present. No tracheal deviation present.  Cardiovascular: Normal rate, regular rhythm, S1 normal, S2 normal, normal heart sounds and intact distal pulses.  No murmur heard. Distant heart tones  Pulmonary/Chest: Effort normal. No accessory muscle usage or stridor. No tachypnea. No respiratory distress. She has decreased breath sounds (throughout all lung fields). She has no wheezes. She has no rhonchi. She has no rales.  Abdominal: Soft. Bowel sounds are normal. She exhibits no distension. There is no tenderness.  Musculoskeletal: She exhibits no edema, tenderness or deformity.  Lymphadenopathy:    She has no cervical adenopathy.  Neurological: She is alert and oriented to person, place, and time.  Skin: Skin is warm and dry. Capillary refill takes less than 2 seconds. No rash noted.  Psychiatric: She has a normal mood and affect. Her behavior is normal.  Vitals reviewed.    Vitals:   03/01/18 1539  BP: 120/82  Pulse: 67  SpO2: 96%  Weight: 187 lb 6.4 oz (85 kg)  Height: 5\' 3"  (1.6 m)   96% on RA BMI Readings from Last 3 Encounters:  03/01/18 33.20 kg/m  01/13/18 32.03 kg/m  12/02/16 32.78 kg/m   Wt Readings from Last 3 Encounters:  03/01/18 187 lb 6.4 oz (85 kg)  01/13/18 186 lb 9.6 oz (84.6 kg)  12/02/16 188 lb (85.3 kg)   CBC    Component Value Date/Time   WBC 9.2 08/22/2015 1234   RBC 4.54 08/22/2015 1234   HGB 15.1 (H) 08/22/2015 1234   HCT 44.1 08/22/2015 1234   PLT 323.0 08/22/2015 1234   MCV 97.0 08/22/2015 1234   MCV 102.6 (A) 01/24/2013 1631   MCH  33.5 12/17/2014 1542   MCHC 34.2 08/22/2015 1234   RDW 13.3 08/22/2015 1234   LYMPHSABS 3.1 08/22/2015 1234   MONOABS 0.5 08/22/2015 1234   EOSABS 0.2 08/22/2015 1234   BASOSABS 0.0 08/22/2015 1234   Chest Imaging: 11/2017 - LDCT   IMPRESSION: 1. Lung-RADS 2, benign appearance or behavior. Continue annual screening with low-dose chest CT without contrast in 12 months. 2. Mild diffuse bronchial wall thickening with mild centrilobular and paraseptal emphysema; imaging findings suggestive of underlying COPD. 3. Aortic atherosclerosis. 4. Mild hepatic steatosis.  Pulmonary Functions Testing Results: 02/11/2018 full PFTs: FVC 2.7 L, 96% predicted postbronchodilator FEV1 2.13 L, 100% predicted postbronchodilator Ratio 78, no significant bronchodilator response TLC 94% predicted DLCO 71%  6-minute walk: 381 m, 95% predicted distance.  FeNO: None   Pathology: None   Echocardiogram: None   Heart Catheterization: None     Assessment & Plan:   Centrilobular and paraseptal emphysema ([facility])  Tobacco abuse  Cigarette smoker  SOB (shortness of breath)  Discussion:  This is a 72 year old female with chronic tobacco abuse and evidence of centrilobular and paraseptal emphysema on low-dose lung cancer screening CT.  PFTs reviewed with her today in the office are reassuring and reveal no significant obstruction despite her persistent use of cigarettes.  Her 6-minute walk test was also reassuring performing 95% predicted distance.  Patient was counseled on avoiding vaping as an alternative.  She was encouraged and counseled on smoking cessation for approximately 10 minutes of our office visit today.  We discussed plans to quit as well as initiation of nicotine alternatives such as nicotine patches and lozenges.  Plan:  Smoking cessation is a must. Can enroll in our low-dose lung cancer screening program and shared decision-making. Continue PRN albuterol for shortness of breath and  wheezing. Continue vitamin D supplementation  Recommended over-the-counter nicotine supplement to include patch versus gum versus lozenge Return to clinic in March 2020.  Hopefully smoke-free.   Current Outpatient Medications:  .  atorvastatin (LIPITOR) 20 MG tablet, Take 20 mg by mouth daily., Disp: , Rfl:  .  benzonatate (TESSALON) 100 MG capsule, Take 1-2 capsules (100-200 mg total) by mouth 3 (three) times daily as needed for cough., Disp: 40 capsule, Rfl: 0 .  fluticasone (FLONASE) 50 MCG/ACT nasal spray, Place 2 sprays into both nostrils daily., Disp: 16 g, Rfl: 12 .  Loperamide HCl (IMODIUM PO), Take by mouth daily. , Disp: , Rfl:  .  LORazepam (ATIVAN) 0.5 MG tablet, Take 0.5 mg by mouth 2 (two) times daily. , Disp: , Rfl:  .  omeprazole (PRILOSEC) 40 MG capsule, Take 40 mg by mouth daily., Disp: , Rfl:  .  sertraline (ZOLOFT) 50 MG tablet, Take 100 mg by mouth daily. , Disp: , Rfl:  .  Tiotropium Bromide Monohydrate (SPIRIVA RESPIMAT) 2.5 MCG/ACT AERS, Inhale 2 puffs into the lungs daily., Disp: 1 Inhaler, Rfl: 3   Garner Nash, DO  Pulmonary Critical Care 03/01/2018 8:52 PM

## 2018-03-01 ENCOUNTER — Ambulatory Visit (INDEPENDENT_AMBULATORY_CARE_PROVIDER_SITE_OTHER): Payer: Medicare Other | Admitting: *Deleted

## 2018-03-01 ENCOUNTER — Encounter: Payer: Self-pay | Admitting: Pulmonary Disease

## 2018-03-01 ENCOUNTER — Ambulatory Visit (INDEPENDENT_AMBULATORY_CARE_PROVIDER_SITE_OTHER): Payer: Medicare Other | Admitting: Pulmonary Disease

## 2018-03-01 VITALS — BP 120/82 | HR 67 | Ht 63.0 in | Wt 187.4 lb

## 2018-03-01 DIAGNOSIS — Z720 Tobacco use: Secondary | ICD-10-CM

## 2018-03-01 DIAGNOSIS — R0602 Shortness of breath: Secondary | ICD-10-CM | POA: Diagnosis not present

## 2018-03-01 DIAGNOSIS — F17210 Nicotine dependence, cigarettes, uncomplicated: Secondary | ICD-10-CM

## 2018-03-01 DIAGNOSIS — J432 Centrilobular emphysema: Secondary | ICD-10-CM

## 2018-03-01 NOTE — Progress Notes (Signed)
SIX MIN WALK 03/01/2018  Medications atorvastatin  Supplimental Oxygen during Test? (L/min) No  Laps 8  Partial Lap (in Meters) 0  Baseline BP (sitting) 128/70  Baseline Heartrate 72  Baseline Dyspnea (Borg Scale) 0  Baseline Fatigue (Borg Scale) 0.5  Baseline SPO2 96  BP (sitting) 138/72  Heartrate 99  Dyspnea (Borg Scale) 0  Fatigue (Borg Scale) 0  SPO2 96  BP (sitting) 122/60  Heartrate 77  SPO2 97  Stopped or Paused before Six Minutes No  Distance Completed 384  Tech Comments: TA/CMA  \

## 2018-03-01 NOTE — Patient Instructions (Signed)
Continue your smoking cessation journey. Tara Davis! Continue albuterol as needed  Stop spirivia Continue Vitamin D supplement  Return to clinic in March 2020

## 2018-03-02 ENCOUNTER — Other Ambulatory Visit: Payer: Self-pay

## 2018-03-02 DIAGNOSIS — F17210 Nicotine dependence, cigarettes, uncomplicated: Secondary | ICD-10-CM

## 2018-03-23 DIAGNOSIS — F419 Anxiety disorder, unspecified: Secondary | ICD-10-CM | POA: Diagnosis not present

## 2018-03-23 DIAGNOSIS — F331 Major depressive disorder, recurrent, moderate: Secondary | ICD-10-CM | POA: Diagnosis not present

## 2018-03-25 DIAGNOSIS — F331 Major depressive disorder, recurrent, moderate: Secondary | ICD-10-CM | POA: Diagnosis not present

## 2018-03-26 DIAGNOSIS — R0989 Other specified symptoms and signs involving the circulatory and respiratory systems: Secondary | ICD-10-CM | POA: Diagnosis not present

## 2018-03-26 DIAGNOSIS — J209 Acute bronchitis, unspecified: Secondary | ICD-10-CM | POA: Diagnosis not present

## 2018-03-29 DIAGNOSIS — D485 Neoplasm of uncertain behavior of skin: Secondary | ICD-10-CM | POA: Diagnosis not present

## 2018-03-29 DIAGNOSIS — D225 Melanocytic nevi of trunk: Secondary | ICD-10-CM | POA: Diagnosis not present

## 2018-03-31 DIAGNOSIS — F419 Anxiety disorder, unspecified: Secondary | ICD-10-CM | POA: Diagnosis not present

## 2018-03-31 DIAGNOSIS — F331 Major depressive disorder, recurrent, moderate: Secondary | ICD-10-CM | POA: Diagnosis not present

## 2018-04-05 ENCOUNTER — Other Ambulatory Visit: Payer: Self-pay | Admitting: Internal Medicine

## 2018-04-05 ENCOUNTER — Other Ambulatory Visit: Payer: Self-pay | Admitting: Nurse Practitioner

## 2018-04-12 DIAGNOSIS — F419 Anxiety disorder, unspecified: Secondary | ICD-10-CM | POA: Diagnosis not present

## 2018-04-12 DIAGNOSIS — F3341 Major depressive disorder, recurrent, in partial remission: Secondary | ICD-10-CM | POA: Diagnosis not present

## 2018-04-13 ENCOUNTER — Ambulatory Visit: Payer: Medicare Other | Admitting: Internal Medicine

## 2018-04-13 ENCOUNTER — Encounter: Payer: Self-pay | Admitting: Internal Medicine

## 2018-04-13 VITALS — BP 120/70 | HR 73 | Temp 97.8°F | Ht 63.0 in | Wt 185.2 lb

## 2018-04-13 DIAGNOSIS — E559 Vitamin D deficiency, unspecified: Secondary | ICD-10-CM | POA: Diagnosis not present

## 2018-04-13 DIAGNOSIS — R7309 Other abnormal glucose: Secondary | ICD-10-CM | POA: Diagnosis not present

## 2018-04-13 DIAGNOSIS — R61 Generalized hyperhidrosis: Secondary | ICD-10-CM

## 2018-04-13 MED ORDER — VITAMIN D (ERGOCALCIFEROL) 1.25 MG (50000 UNIT) PO CAPS: ORAL_CAPSULE | ORAL | 0 refills | Status: DC

## 2018-04-13 NOTE — Progress Notes (Signed)
Subjective:     Patient ID: Tara Davis , female    DOB: 01-10-1946 , 72 y.o.   MRN: [id number]   Chief Complaint  Patient presents with  . Excessive Sweating    would like thyroid checked     HPI Has been experiencing am sweats in the past 2 months which is new for her.  Has been taking Vit D 50K 2/week  Which was prescribed by pulmonologist.  She was menopausal with symptoms years ago.    Past Medical History:  Diagnosis Date  . Anxiety   . Bilateral cataracts    within 6 mos.  . Depression   . GERD (gastroesophageal reflux disease)   . Hyperlipidemia   . Ulcer 1988     Family History  Problem Relation Age of Onset  . Alzheimer's disease Mother   . Depression Father   . Diabetes Father   . Breast cancer Sister   . Colon cancer Neg Hx      Current Outpatient Medications:  .  atorvastatin (LIPITOR) 20 MG tablet, TAKE 1 TABLET BY MOUTH EVERY DAY, Disp: 90 tablet, Rfl: 0 .  benzonatate (TESSALON) 100 MG capsule, Take 1-2 capsules (100-200 mg total) by mouth 3 (three) times daily as needed for cough., Disp: 40 capsule, Rfl: 0 .  fluticasone (FLONASE) 50 MCG/ACT nasal spray, Place 2 sprays into both nostrils daily., Disp: 16 g, Rfl: 12 .  Loperamide HCl (IMODIUM PO), Take by mouth daily. , Disp: , Rfl:  .  LORazepam (ATIVAN) 0.5 MG tablet, Take 0.5 mg by mouth 2 (two) times daily. , Disp: , Rfl:  .  omeprazole (PRILOSEC) 40 MG capsule, Take 40 mg by mouth daily., Disp: , Rfl:  .  sertraline (ZOLOFT) 50 MG tablet, Take 100 mg by mouth daily. , Disp: , Rfl:  .  Tiotropium Bromide Monohydrate (SPIRIVA RESPIMAT) 2.5 MCG/ACT AERS, Inhale 2 puffs into the lungs daily., Disp: 1 Inhaler, Rfl: 3 .  VENTOLIN HFA 108 (90 Base) MCG/ACT inhaler, INHALE 1 PUFF AS NEEDED, Disp: , Rfl: 2   Allergies  Allergen Reactions  . Codeine     REACTION: hallucinations     Review of Systems  Constitutional: Positive for diaphoresis. Negative for chills, fatigue and fever.  Respiratory:  Negative for cough and wheezing.   Cardiovascular: Negative for palpitations.  Gastrointestinal: Positive for diarrhea. Negative for abdominal distention, constipation, nausea and vomiting.       Chronic since her GB surgery  Endocrine: Negative for cold intolerance, heat intolerance, polydipsia, polyphagia and polyuria.  Skin: Negative for rash and wound.       Denies dry skin that is new for her due to season Denies hair loss  Allergic/Immunologic: Positive for environmental allergies.  Psychiatric/Behavioral: Negative for sleep disturbance.     Today's Vitals   04/13/18 1219  BP: 120/70  Pulse: 73  Temp: 97.8 F (36.6 C)  TempSrc: Oral  SpO2: 97%  Weight: 185 lb 3.2 oz (84 kg)  Height: 5\' 3"  (1.6 m)   Body mass index is 32.81 kg/m.   Objective:  Physical Exam   Constitutional: She is oriented to person, place, and time. She appears well-developed and well-nourished. No distress.  HENT:  Head: Normocephalic and atraumatic.  Right Ear: External ear normal.  Left Ear: External ear normal.  Nose: Nose normal.  Eyes: Conjunctivae are normal. Right eye exhibits no discharge. Left eye exhibits no discharge. No scleral icterus.  Neck: Neck supple. No thyromegaly present.  No  carotid bruits bilaterally  Cardiovascular: Normal rate and regular rhythm.  No murmur heard. Pulmonary/Chest: Effort normal and breath sounds normal. No respiratory distress.  Musculoskeletal: Normal range of motion. She exhibits no edema.  Lymphadenopathy:    She has no cervical adenopathy.  Neurological: She is alert and oriented to person, place, and time.  Skin: Skin is warm and dry. Capillary refill takes less than 2 seconds. No rash noted. She is not diaphoretic.  Psychiatric: She has a normal mood and affect. Her behavior is normal. Judgment and thought content normal.  Nursing note reviewed.     Assessment And Plan:  1. Diaphoresis - TSH - T3, free - T4, Free - CMP14 + Anion Gap - CBC  no Diff  2. Abnormal glucose- chronic - Hemoglobin A1c  3. Vitamin D deficiency- chronic - Vitamin D 1,25 Dihydroxy  If all test are negative, the next thing to test is for Carcinoid.  Jadamarie Butson RODRIGUEZ-SOUTHWORTH, PA-C

## 2018-04-13 NOTE — Patient Instructions (Addendum)
If your labs are normal, we need to consider checking for carcinoid which can cause flushing and sweating.   Hyperhidrosis It is normal to sweat when you are hot, being physically active, or feeling anxious. Sweating is a necessary function for your body. However, hyperhidrosis is when you sweat too much (excessively). Although hyperhidrosis is not dangerous, it can make you feel embarrassed. There are two kinds of hyperhidrosis:  Primary hyperhidrosis. The sweating usually localizes in one part of your body, such as your underarms, or in a few areas, such as your feet, face, armpits, and hands. This is the more common kind of hyperhidrosis.  Secondary hyperhidrosis. This type more likely affects your entire body.  What are the causes? The cause of your hyperhidrosis depends on the kind you have.  Primary hyperhidrosis may be caused by having sweat glands that are more active than normal.  Secondary hyperhidrosis is caused by an underlying condition. Possible conditions include: ? Diabetes. ? Gout. ? Certain medicines. ? Anxiety. ? Stroke. ? Obesity. ? Menopause. ? Overactive thyroid (hyperthyroidism). ? Tumors. ? Frostbite. ? Certain types of cancers. ? Alcoholism. ? Injury to your nervous system. ? Stroke. ? Parkinson disease.  What increases the risk? You may be at an increased risk for primary hyperhidrosis if you have a family history of it. What are the signs or symptoms? General symptoms of hyperhidrosis may include:  Feeling like you are sweating constantly, even while you are resting.  Having skin that peels or gets paler or softer in the areas where you sweat the most.  Being able to see sweat on your skin.  Symptoms of primary hyperhidrosis may include:  Sweating in specific areas, such as your armpits, palms, feet, and face.  Sweating in the same location on both sides of your body.  Sweating only during the day.  Symptoms of secondary hyperhidrosis may  include:  Sweating all over your body.  Sweating even while you sleep.  How is this diagnosed? Hyperhidrosis may be diagnosed by:  Medical history and physical exam.  Testing, such as: ? Sweat test. ? Paper test.  How is this treated? Your treatment will depend on the kind of hyperhidrosis you have and the parts of your body that are affected. If your hyperhidrosis is caused by an underlying condition, your treatment will address the cause. Treatment may include:  Strong antiperspirants. Your health care provider may give you a prescription.  Medicines taken by mouth.  Medicines injected by your health care provider. These may include small amounts of botulinum toxin.  Iontophoresis. This is a procedure that temporarily turns off the sweat glands in your hands and feet.  Surgery to remove your sweat glands.  Sympathectomy. This is a procedure that cuts or destroys your nerves so that they do not send a signal to sweat.  Follow these instructions at home:  Take medicines only as directed by your health care provider.  Use antiperspirants as directed by your health care provider.  Limit or avoid foods or beverages that seem to increase your chances of sweating, such as: ? Spicy food. ? Caffeine. ? Alcohol. ? Foods that contain MSG.  If your feet sweat: ? Wear sandals, when possible. ? Do not wear cotton socks. Wear socks that remove or wick moisture from your feet. ? Wear leather shoes. ? Avoid wearing the same pair of shoes two days in a row.  Consider joining a hyperhidrosis support group. Contact a health care provider if:  You have  new symptoms.  Your symptoms get worse. This information is not intended to replace advice given to you by your health care provider. Make sure you discuss any questions you have with your health care provider. Document Released: 07/25/2005 Document Revised: 11/01/2015 Document Reviewed: 01/03/2014 Elsevier Interactive Patient  Education  [employer].

## 2018-04-14 LAB — TSH: TSH: 1.85 u[IU]/mL (ref 0.450–4.500)

## 2018-04-14 LAB — CMP14 + ANION GAP
A/G RATIO: 2.1 (ref 1.2–2.2)
ALBUMIN: 4.6 g/dL (ref 3.5–4.8)
ALT: 28 IU/L (ref 0–32)
AST: 19 IU/L (ref 0–40)
Alkaline Phosphatase: 75 IU/L (ref 39–117)
Anion Gap: 16 mmol/L (ref 10.0–18.0)
BUN / CREAT RATIO: 24 (ref 12–28)
BUN: 19 mg/dL (ref 8–27)
Bilirubin Total: 0.5 mg/dL (ref 0.0–1.2)
CALCIUM: 9.5 mg/dL (ref 8.7–10.3)
CO2: 21 mmol/L (ref 20–29)
Chloride: 103 mmol/L (ref 96–106)
Creatinine, Ser: 0.79 mg/dL (ref 0.57–1.00)
GFR, EST AFRICAN AMERICAN: 87 mL/min/{1.73_m2} (ref >59)
GFR, EST NON AFRICAN AMERICAN: 76 mL/min/{1.73_m2} (ref >59)
GLOBULIN, TOTAL: 2.2 g/dL (ref 1.5–4.5)
Glucose: 101 mg/dL — ABNORMAL HIGH (ref 65–99)
POTASSIUM: 4.3 mmol/L (ref 3.5–5.2)
SODIUM: 140 mmol/L (ref 134–144)
Total Protein: 6.8 g/dL (ref 6.0–8.5)

## 2018-04-14 LAB — CBC
Hematocrit: 42.2 % (ref 34.0–46.6)
Hemoglobin: 14.6 g/dL (ref 11.1–15.9)
MCH: 33.3 pg — AB (ref 26.6–33.0)
MCHC: 34.6 g/dL (ref 31.5–35.7)
MCV: 96 fL (ref 79–97)
PLATELETS: 289 10*3/uL (ref 150–450)
RBC: 4.38 x10E6/uL (ref 3.77–5.28)
RDW: 12.7 % (ref 12.3–15.4)
WBC: 8.7 10*3/uL (ref 3.4–10.8)

## 2018-04-14 LAB — HEMOGLOBIN A1C
Est. average glucose Bld gHb Est-mCnc: 123 mg/dL
Hgb A1c MFr Bld: 5.9 % — ABNORMAL HIGH (ref 4.8–5.6)

## 2018-04-14 LAB — T4, FREE: Free T4: 1.13 ng/dL (ref 0.82–1.77)

## 2018-04-14 LAB — VITAMIN D 25 HYDROXY (VIT D DEFICIENCY, FRACTURES): VIT D 25 HYDROXY: 31.2 ng/mL (ref 30.0–100.0)

## 2018-04-14 LAB — T3, FREE: T3, Free: 2.8 pg/mL (ref 2.0–4.4)

## 2018-04-27 ENCOUNTER — Other Ambulatory Visit: Payer: Self-pay

## 2018-04-27 DIAGNOSIS — F419 Anxiety disorder, unspecified: Secondary | ICD-10-CM | POA: Diagnosis not present

## 2018-04-27 DIAGNOSIS — F3341 Major depressive disorder, recurrent, in partial remission: Secondary | ICD-10-CM | POA: Diagnosis not present

## 2018-04-27 MED ORDER — VITAMIN D (ERGOCALCIFEROL) 1.25 MG (50000 UNIT) PO CAPS: ORAL_CAPSULE | ORAL | 5 refills | Status: DC

## 2018-04-30 ENCOUNTER — Other Ambulatory Visit: Payer: Self-pay

## 2018-06-21 IMAGING — CR DG CHEST 2V
2 series · 2 of 2 positions shown · non-contrast
Comparison: 12/17/2009

CLINICAL DATA: Cough and right posterior chest pain.  Smoker .

EXAM:
CHEST  2 VIEW

[w chest pa]
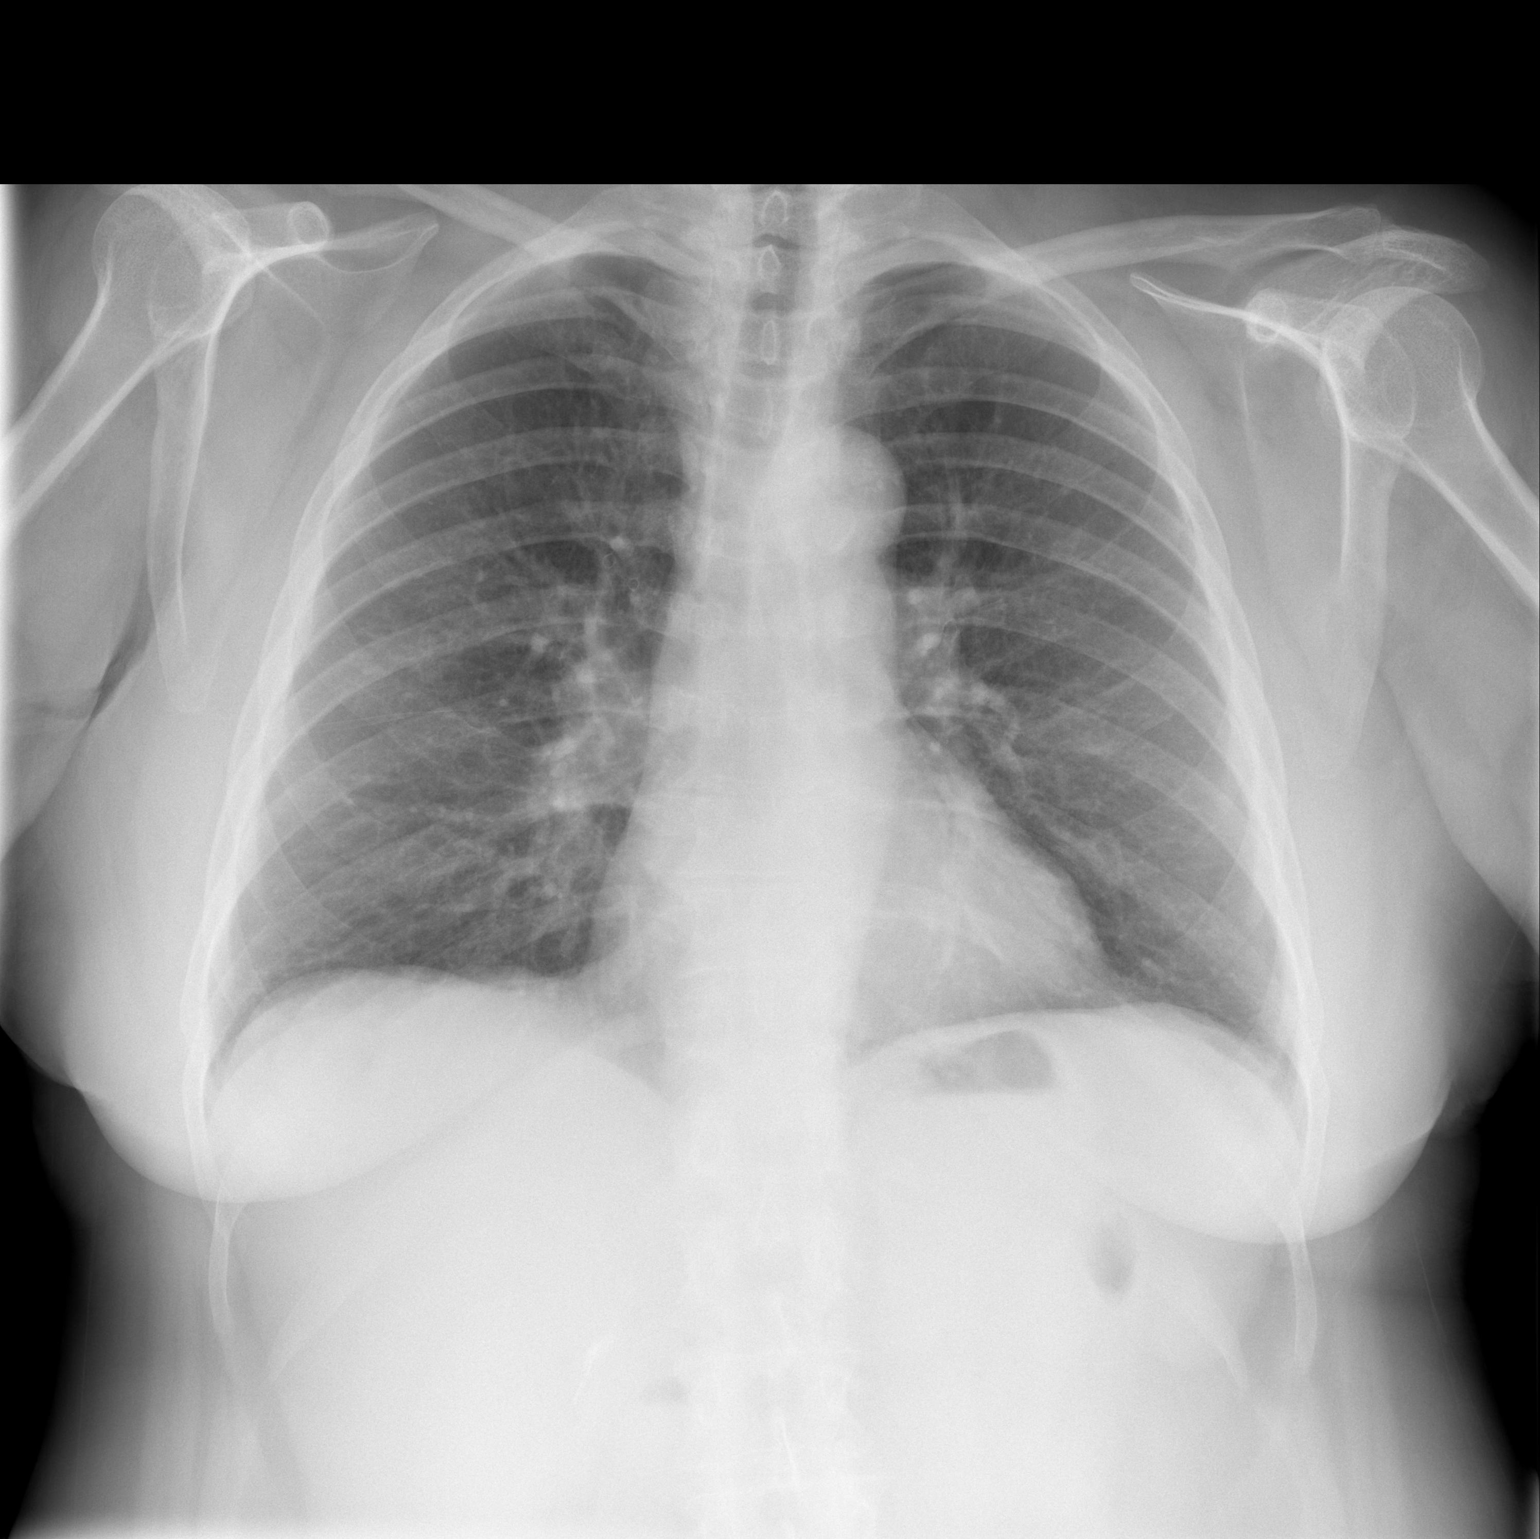

[w chest lat]
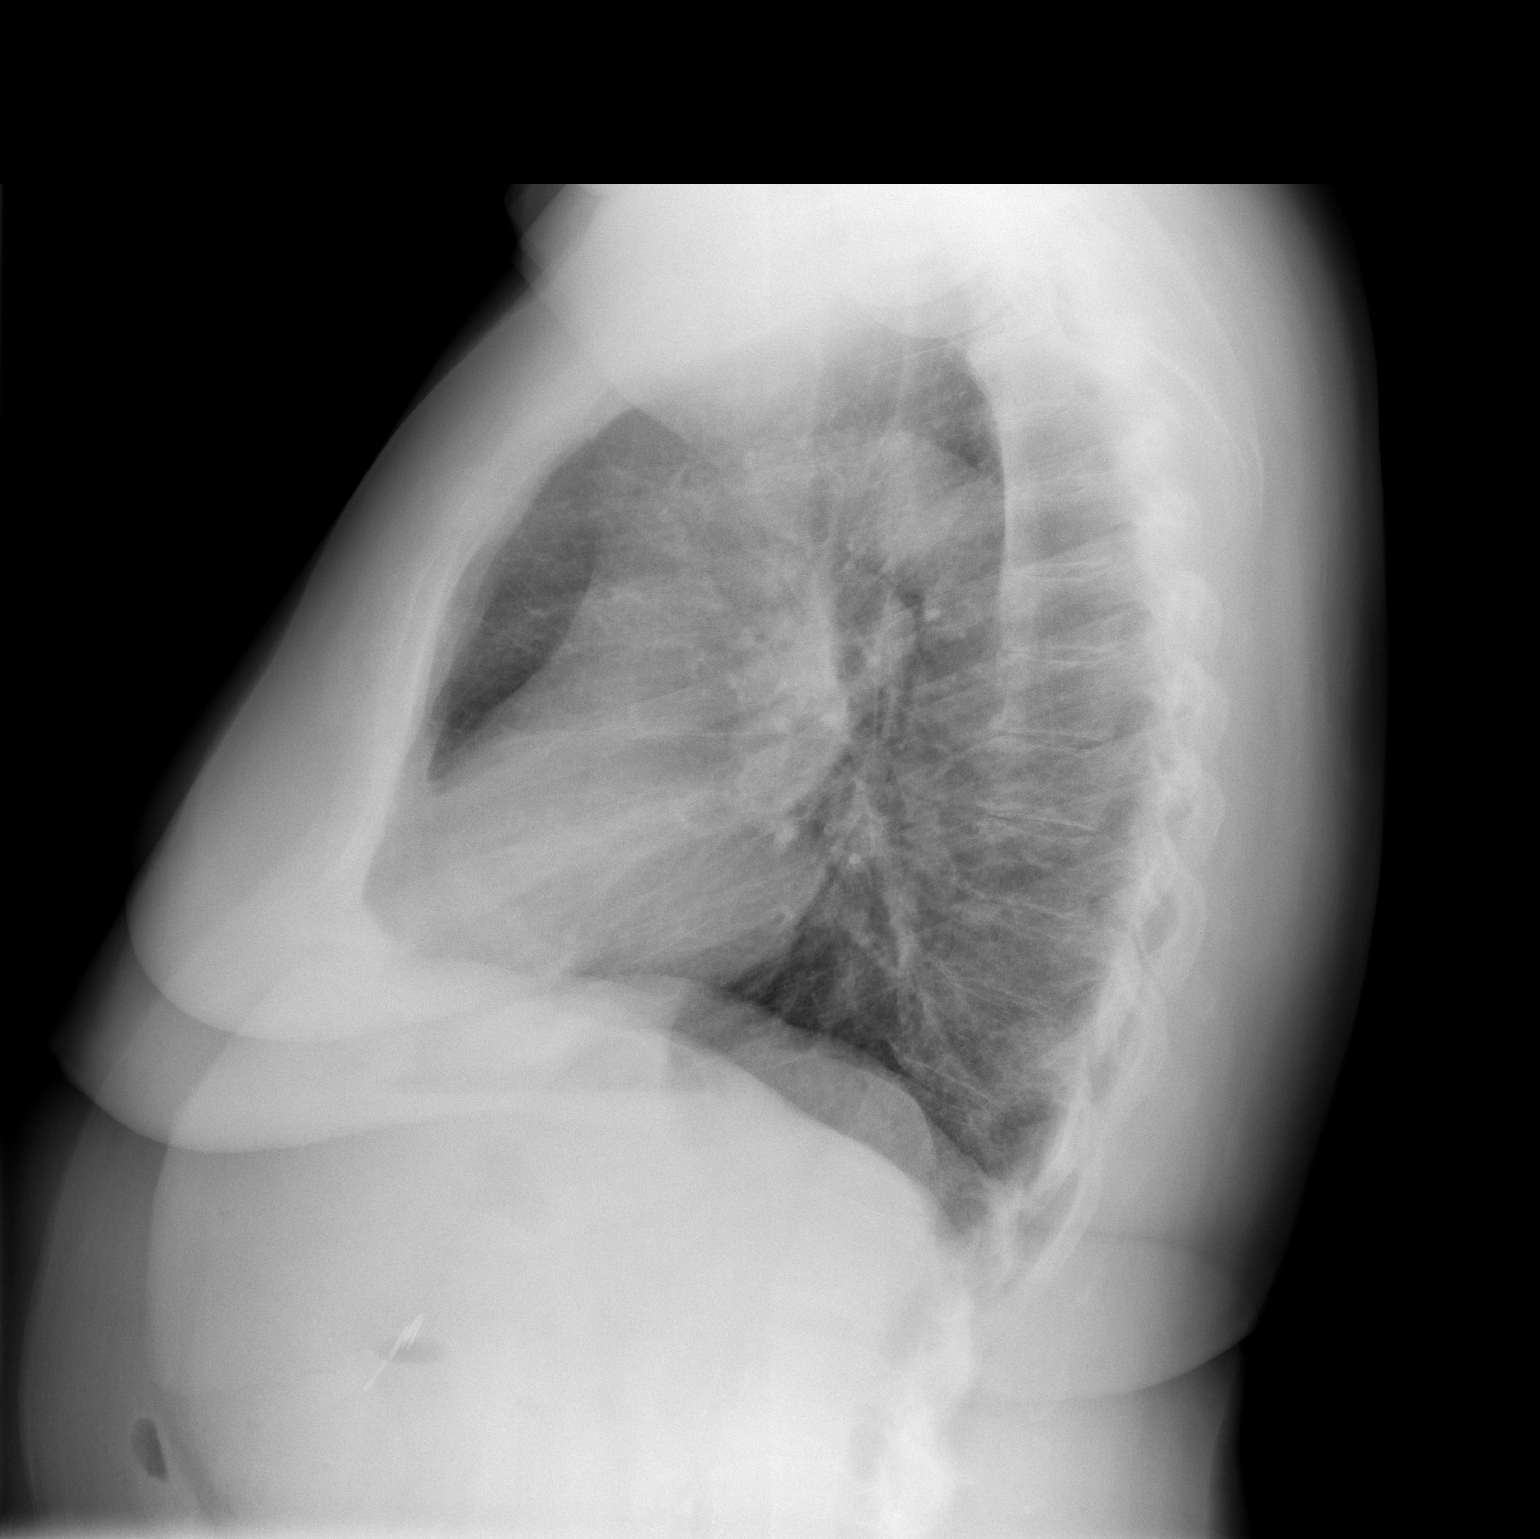

[2 of 2 positions shown; findings below may reference images not displayed]

FINDINGS: The heart size and mediastinal contours are within normal limits.
Aortic atherosclerosis. Both lungs are clear. The visualized
skeletal structures are unremarkable.
IMPRESSION: No active cardiopulmonary disease.

## 2018-07-12 ENCOUNTER — Ambulatory Visit: Payer: Self-pay | Admitting: Internal Medicine

## 2018-07-23 ENCOUNTER — Other Ambulatory Visit: Payer: Self-pay | Admitting: Internal Medicine

## 2018-09-01 ENCOUNTER — Other Ambulatory Visit: Payer: Self-pay | Admitting: Internal Medicine

## 2018-11-04 DIAGNOSIS — F331 Major depressive disorder, recurrent, moderate: Secondary | ICD-10-CM | POA: Diagnosis not present

## 2018-11-09 ENCOUNTER — Other Ambulatory Visit: Payer: Self-pay | Admitting: Acute Care

## 2018-11-09 DIAGNOSIS — Z87891 Personal history of nicotine dependence: Secondary | ICD-10-CM

## 2018-11-09 DIAGNOSIS — F17210 Nicotine dependence, cigarettes, uncomplicated: Secondary | ICD-10-CM

## 2018-11-09 DIAGNOSIS — Z122 Encounter for screening for malignant neoplasm of respiratory organs: Secondary | ICD-10-CM

## 2018-11-15 DIAGNOSIS — F331 Major depressive disorder, recurrent, moderate: Secondary | ICD-10-CM | POA: Diagnosis not present

## 2018-11-15 DIAGNOSIS — F419 Anxiety disorder, unspecified: Secondary | ICD-10-CM | POA: Diagnosis not present

## 2018-11-19 DIAGNOSIS — F331 Major depressive disorder, recurrent, moderate: Secondary | ICD-10-CM | POA: Diagnosis not present

## 2018-11-22 DIAGNOSIS — F419 Anxiety disorder, unspecified: Secondary | ICD-10-CM | POA: Diagnosis not present

## 2018-11-22 DIAGNOSIS — F331 Major depressive disorder, recurrent, moderate: Secondary | ICD-10-CM | POA: Diagnosis not present

## 2018-11-25 ENCOUNTER — Other Ambulatory Visit: Payer: Self-pay | Admitting: Internal Medicine

## 2018-11-29 ENCOUNTER — Telehealth: Payer: Self-pay

## 2018-11-29 NOTE — Telephone Encounter (Signed)
Verbal consent given for virtual visit  

## 2018-11-30 ENCOUNTER — Other Ambulatory Visit: Payer: Self-pay

## 2018-11-30 ENCOUNTER — Ambulatory Visit (INDEPENDENT_AMBULATORY_CARE_PROVIDER_SITE_OTHER): Payer: Medicare Other | Admitting: Internal Medicine

## 2018-11-30 VITALS — BP 127/67 | Temp 98.6°F | Ht 63.0 in | Wt 189.0 lb

## 2018-11-30 DIAGNOSIS — R112 Nausea with vomiting, unspecified: Secondary | ICD-10-CM | POA: Diagnosis not present

## 2018-11-30 DIAGNOSIS — R140 Abdominal distension (gaseous): Secondary | ICD-10-CM | POA: Diagnosis not present

## 2018-11-30 DIAGNOSIS — R142 Eructation: Secondary | ICD-10-CM

## 2018-11-30 MED ORDER — OMEPRAZOLE 40 MG PO CPDR: 40.0000 mg | DELAYED_RELEASE_CAPSULE | Freq: Every day | ORAL | 0 refills | Status: DC

## 2018-11-30 NOTE — Patient Instructions (Addendum)
Increase omeprazole to twice daily x 1-2 weeks Will consider switching to another   Low-FODMAP Eating Plan  FODMAPs (fermentable oligosaccharides, disaccharides, monosaccharides, and polyols) are sugars that are hard for some people to digest. A low-FODMAP eating plan may help some people who have bowel (intestinal) diseases to manage their symptoms. This meal plan can be complicated to follow. Work with a diet and nutrition specialist (dietitian) to make a low-FODMAP eating plan that is right for you. A dietitian can make sure that you get enough nutrition from this diet. What are tips for following this plan? Reading food labels  Check labels for hidden FODMAPs such as: ? High-fructose syrup. ? Honey. ? Agave. ? Natural fruit flavors. ? Onion or garlic powder.  Choose low-FODMAP foods that contain 3-4 grams of fiber per serving.  Check food labels for serving sizes. Eat only one serving at a time to make sure FODMAP levels stay low. Meal planning  Follow a low-FODMAP eating plan for up to 6 weeks, or as told by your health care provider or dietitian.  To follow the eating plan: 1. Eliminate high-FODMAP foods from your diet completely. 2. Gradually reintroduce high-FODMAP foods into your diet one at a time. Most people should wait a few days after introducing one high-FODMAP food before they introduce the next high-FODMAP food. Your dietitian can recommend how quickly you may reintroduce foods. 3. Keep a daily record of what you eat and drink, and make note of any symptoms that you have after eating. 4. Review your daily record with a dietitian regularly. Your dietitian can help you identify which foods you can eat and which foods you should avoid. General tips  Drink enough fluid each day to keep your urine pale yellow.  Avoid processed foods. These often have added sugar and may be high in FODMAPs.  Avoid most dairy products, whole grains, and sweeteners.  Work with a  dietitian to make sure you get enough fiber in your diet. Recommended foods Grains  Gluten-free grains, such as rice, oats, buckwheat, quinoa, corn, polenta, and millet. Gluten-free pasta, bread, or cereal. Rice noodles. Corn tortillas. Vegetables  Eggplant, zucchini, cucumber, peppers, green beans, Brussels sprouts, bean sprouts, lettuce, arugula, kale, Swiss chard, spinach, collard greens, bok choy, summer squash, potato, and tomato. Limited amounts of corn, carrot, and sweet potato. Green parts of scallions. Fruits  Bananas, oranges, lemons, limes, blueberries, raspberries, strawberries, grapes, cantaloupe, honeydew melon, kiwi, papaya, passion fruit, and pineapple. Limited amounts of dried cranberries, banana chips, and shredded coconut. Dairy  Lactose-free milk, yogurt, and kefir. Lactose-free cottage cheese and ice cream. Non-dairy milks, such as almond, coconut, hemp, and rice milk. Yogurts made of non-dairy milks. Limited amounts of goat cheese, brie, mozzarella, parmesan, swiss, and other hard cheeses. Meats and other protein foods  Unseasoned beef, pork, poultry, or fish. Eggs. Berniece Salines. Tofu (firm) and tempeh. Limited amounts of nuts and seeds, such as almonds, walnuts, [place] nuts, pecans, peanuts, pumpkin seeds, chia seeds, and sunflower seeds. Fats and oils  Butter-free spreads. Vegetable oils, such as olive, canola, and sunflower oil. Seasoning and other foods  Artificial sweeteners with names that do not end in "ol" such as aspartame, saccharine, and stevia. Maple syrup, white table sugar, raw sugar, brown sugar, and molasses. Fresh basil, coriander, parsley, rosemary, and thyme. Beverages  Water and mineral water. Sugar-sweetened soft drinks. Small amounts of orange juice or cranberry juice. Black and green tea. Most dry wines. Coffee. This may not be a complete list of  low-FODMAP foods. Talk with your dietitian for more information. Foods to avoid Grains  Wheat, including  kamut, durum, and semolina. Barley and bulgur. Couscous. Wheat-based cereals. Wheat noodles, bread, crackers, and pastries. Vegetables  Chicory root, artichoke, asparagus, cabbage, snow peas, sugar snap peas, mushrooms, and cauliflower. Onions, garlic, leeks, and the white part of scallions. Fruits  Fresh, dried, and juiced forms of apple, pear, watermelon, peach, plum, cherries, apricots, blackberries, boysenberries, figs, nectarines, and mango. Avocado. Dairy  Milk, yogurt, ice cream, and soft cheese. Cream and sour cream. Milk-based sauces. Custard. Meats and other protein foods  Fried or fatty meat. Sausage. Cashews and pistachios. Soybeans, baked beans, black beans, chickpeas, kidney beans, fava beans, navy beans, lentils, and split peas. Seasoning and other foods  Any sugar-free gum or candy. Foods that contain artificial sweeteners such as sorbitol, mannitol, isomalt, or xylitol. Foods that contain honey, high-fructose corn syrup, or agave. Bouillon, vegetable stock, beef stock, and chicken stock. Garlic and onion powder. Condiments made with onion, such as hummus, chutney, pickles, relish, salad dressing, and salsa. Tomato paste. Beverages  Chicory-based drinks. Coffee substitutes. Chamomile tea. Fennel tea. Sweet or fortified wines such as port or sherry. Diet soft drinks made with isomalt, mannitol, maltitol, sorbitol, or xylitol. Apple, pear, and mango juice. Juices with high-fructose corn syrup. This may not be a complete list of high-FODMAP foods. Talk with your dietitian to discuss what dietary choices are best for you.  Summary  A low-FODMAP eating plan is a short-term diet that eliminates FODMAPs from your diet to help ease symptoms of certain bowel diseases.  The eating plan usually lasts up to 6 weeks. After that, high-FODMAP foods are restarted gradually, one at a time, so you can find out which may be causing symptoms.  A low-FODMAP eating plan can be complicated. It is  best to work with a dietitian who has experience with this type of plan. This information is not intended to replace advice given to you by your health care provider. Make sure you discuss any questions you have with your health care provider. Document Released: 01/20/2017 Document Revised: 01/20/2017 Document Reviewed: 01/20/2017 Elsevier Interactive Patient Education  [employer].

## 2018-12-02 NOTE — Progress Notes (Addendum)
Virtual Visit via Video   This visit type was conducted due to national recommendations for restrictions regarding the COVID-19 Pandemic (e.g. social distancing) in an effort to limit this patient's exposure and mitigate transmission in our community.  Due to her co-morbid illnesses, this patient is at least at moderate risk for complications without adequate follow up.  This format is felt to be most appropriate for this patient at this time.  All issues noted in this document were discussed and addressed.  A limited physical exam was performed with this format.    This visit type was conducted due to national recommendations for restrictions regarding the COVID-19 Pandemic (e.g. social distancing) in an effort to limit this patient's exposure and mitigate transmission in our community.  Patients identity confirmed using two different identifiers.  This format is felt to be most appropriate for this patient at this time.  All issues noted in this document were discussed and addressed.  No physical exam was performed (except for noted visual exam findings with Video Visits).    Date:  12/02/2018   ID:  Tara Davis, DOB May 30, 1946, MRN [id number]  Patient Location:  Home  Provider location:   Office    Chief Complaint:  "bloating"  History of Present Illness:    Tara Davis is a 73 y.o. female who presents via video conferencing for a telehealth visit today.    The patient does not have symptoms concerning for COVID-19 infection (fever, chills, cough, or new shortness of breath).   She presents today for virtual visit. She prefers this method of contact due to COVID-19 pandemic. She presents today for further evaluation of abdominal bloating. She has h/o IBS. She is also followed by GI. She has yet to contact them about these symptoms.  There has been some n/v. She has taken some OTC Prilosec with minimal improvement of her symptoms.     Past Medical History:  Diagnosis Date  .  Anxiety   . Bilateral cataracts    within 6 mos.  . Depression   . GERD (gastroesophageal reflux disease)   . Hyperlipidemia   . Ulcer 1988   Past Surgical History:  Procedure Laterality Date  . APPENDECTOMY    . CATARACT EXTRACTION, BILATERAL    . CHOLECYSTECTOMY    . COLONOSCOPY    . spontaneous vaginal delivery     x2  . TONSILLECTOMY    . WISDOM TOOTH EXTRACTION       Current Meds  Medication Sig  . atorvastatin (LIPITOR) 20 MG tablet TAKE 1 TABLET BY MOUTH EVERY DAY  . benzonatate (TESSALON) 100 MG capsule Take 1-2 capsules (100-200 mg total) by mouth 3 (three) times daily as needed for cough.  . fluticasone (FLONASE) 50 MCG/ACT nasal spray Place 2 sprays into both nostrils daily.  . Investigational vitamin D 600 UNITS capsule SWOG [id number] Take 600 Units by mouth daily. Take with food.  . Loperamide HCl (IMODIUM PO) Take by mouth daily.   Marland Kitchen LORazepam (ATIVAN) 0.5 MG tablet Take 0.5 mg by mouth 2 (two) times daily.   Marland Kitchen omeprazole (PRILOSEC) 40 MG capsule Take 1 capsule (40 mg total) by mouth daily.  . sertraline (ZOLOFT) 50 MG tablet Take 100 mg by mouth daily.   . VENTOLIN HFA 108 (90 Base) MCG/ACT inhaler INHALE 1 PUFF AS NEEDED  . [DISCONTINUED] omeprazole (PRILOSEC) 40 MG capsule Take 40 mg by mouth daily.  . [DISCONTINUED] Vitamin D, Ergocalciferol, (DRISDOL) 1.25 MG (50000 UT) CAPS  capsule One twice a week( per pulmonologist)     Allergies:   Codeine   Social History   Tobacco Use  . Smoking status: Current Every Day Smoker    Packs/day: 0.75    Years: 53.00    Pack years: 39.75    Types: Cigarettes  . Smokeless tobacco: Never Used  . Tobacco comment: smokes 5-7 cigarettes/day   Substance Use Topics  . Alcohol use: Yes    Alcohol/week: 14.0 standard drinks    Types: 14 Shots of liquor per week  . Drug use: No     Family Hx: The patient's family history includes Alzheimer's disease in her mother; Breast cancer in her sister; Depression in her father;  Diabetes in her father. There is no history of Colon cancer.  ROS:   Please see the history of present illness.    Review of Systems  Constitutional: Negative.   Respiratory: Negative.   Cardiovascular: Negative.   Gastrointestinal: Positive for abdominal pain, nausea and vomiting.  Neurological: Negative.   Psychiatric/Behavioral: Negative.     All other systems reviewed and are negative.   Labs/Other Tests and Data Reviewed:    Recent Labs: 04/13/2018: ALT 28; BUN 19; Creatinine, Ser 0.79; Hemoglobin 14.6; Platelets 289; Potassium 4.3; Sodium 140; TSH 1.850   Recent Lipid Panel Lab Results  Component Value Date/Time   CHOL (H) 01/08/2008 04:00 AM    256        ATP III CLASSIFICATION:  <200     mg/dL   Desirable  200-239  mg/dL   Borderline High  >=240    mg/dL   High   TRIG 257 (H) 01/08/2008 04:00 AM   HDL 37 (L) 01/08/2008 04:00 AM   CHOLHDL 6.9 01/08/2008 04:00 AM   LDLCALC (H) 01/08/2008 04:00 AM    168        Total Cholesterol/HDL:CHD Risk Coronary Heart Disease Risk Table                     Men   Women  1/2 Average Risk   3.4   3.3    Wt Readings from Last 3 Encounters:  11/30/18 189 lb (85.7 kg)  04/13/18 185 lb 3.2 oz (84 kg)  03/01/18 187 lb 6.4 oz (85 kg)     Exam:    Vital Signs:  BP 127/67 (BP Location: Left Arm, Patient Position: Sitting, Cuff Size: Normal) Comment: pt provided  Temp 98.6 F (37 C) (Oral) Comment: pt provided  Ht 5' 3" (1.6 m)   Wt 189 lb (85.7 kg)   BMI 33.48 kg/m     Physical Exam  Constitutional: She is oriented to person, place, and time and well-developed, well-nourished, and in no distress.  HENT:  Head: Normocephalic and atraumatic.  Neck: Normal range of motion.  Pulmonary/Chest: Effort normal.  Neurological: She is alert and oriented to person, place, and time.  Psychiatric: Affect normal.  Nursing note and vitals reviewed.   ASSESSMENT & PLAN:     1. Bloating  Again, she has long-standing GI issues. Her  sx are suggestive of GERD. I will increase her dose of omeprazole to 20m daily. If no relief, I will consider ovarian cause and check CA-125.   - CA 125; Future  2. Non-intractable vomiting with nausea, unspecified vomiting type  She agrees to having bloodwork should her sx persist despite use of higher dose of omeprazole.   - CMP14+EGFR; Future - CBC with Diff; Future  3. Belching  Collectively, her sx are suggestive of poorly controlled GERD. I will increase her omeprazole to 61m daily. Rx will be sent to the pharmacy. She will rto in four weeks for virtual visit f/u.   COVID-19 Education: The signs and symptoms of COVID-19 were discussed with the patient and how to seek care for testing (follow up with PCP or arrange E-visit).  The importance of social distancing was discussed today.  Patient Risk:   After full review of this patients clinical status, I feel that they are at least moderate risk at this time.  Time:   Today, I have spent 29 minutes/ 10 seconds with the patient with telehealth technology discussing above diagnoses.     Medication Adjustments/Labs and Tests Ordered: Current medicines are reviewed at length with the patient today.  Concerns regarding medicines are outlined above.   Tests Ordered: Orders Placed This Encounter  Procedures  . CA 125  . CMP14+EGFR  . CBC with Diff    Medication Changes: Meds ordered this encounter  Medications  . omeprazole (PRILOSEC) 40 MG capsule    Sig: Take 1 capsule (40 mg total) by mouth daily.    Dispense:  90 capsule    Refill:  0    Disposition:  Follow up in 6 week(s)  Signed, RMaximino Greenland MD     THE PATIENT IS ENCOURAGED TO PRACTICE SOCIAL DISTANCING DUE TO THE COVID-19 PANDEMIC.

## 2018-12-05 ENCOUNTER — Encounter: Payer: Self-pay | Admitting: Internal Medicine

## 2018-12-06 ENCOUNTER — Encounter: Payer: Medicare Other | Admitting: Acute Care

## 2018-12-06 ENCOUNTER — Inpatient Hospital Stay: Admission: RE | Admit: 2018-12-06 | Payer: Medicare Other | Source: Ambulatory Visit

## 2018-12-06 DIAGNOSIS — F331 Major depressive disorder, recurrent, moderate: Secondary | ICD-10-CM | POA: Diagnosis not present

## 2018-12-07 ENCOUNTER — Other Ambulatory Visit: Payer: Self-pay

## 2018-12-07 ENCOUNTER — Other Ambulatory Visit: Payer: Medicare Other

## 2018-12-07 DIAGNOSIS — R140 Abdominal distension (gaseous): Secondary | ICD-10-CM

## 2018-12-07 DIAGNOSIS — R112 Nausea with vomiting, unspecified: Secondary | ICD-10-CM | POA: Diagnosis not present

## 2018-12-08 LAB — CMP14+EGFR
ALT: 30 IU/L (ref 0–32)
AST: 21 IU/L (ref 0–40)
Albumin/Globulin Ratio: 2 (ref 1.2–2.2)
Albumin: 4.5 g/dL (ref 3.7–4.7)
Alkaline Phosphatase: 70 IU/L (ref 39–117)
BUN/Creatinine Ratio: 16 (ref 12–28)
BUN: 15 mg/dL (ref 8–27)
Bilirubin Total: 0.3 mg/dL (ref 0.0–1.2)
CO2: 19 mmol/L — ABNORMAL LOW (ref 20–29)
Calcium: 9 mg/dL (ref 8.7–10.3)
Chloride: 102 mmol/L (ref 96–106)
Creatinine, Ser: 0.91 mg/dL (ref 0.57–1.00)
GFR calc Af Amer: 73 mL/min/{1.73_m2} (ref >59)
GFR calc non Af Amer: 63 mL/min/{1.73_m2} (ref >59)
Globulin, Total: 2.2 g/dL (ref 1.5–4.5)
Glucose: 124 mg/dL — ABNORMAL HIGH (ref 65–99)
Potassium: 4.5 mmol/L (ref 3.5–5.2)
Sodium: 142 mmol/L (ref 134–144)
Total Protein: 6.7 g/dL (ref 6.0–8.5)

## 2018-12-08 LAB — CBC WITH DIFFERENTIAL/PLATELET
Basophils Absolute: 0.1 10*3/uL (ref 0.0–0.2)
Basos: 1 %
EOS (ABSOLUTE): 0.2 10*3/uL (ref 0.0–0.4)
Eos: 2 %
Hematocrit: 41.9 % (ref 34.0–46.6)
Hemoglobin: 14.5 g/dL (ref 11.1–15.9)
Immature Grans (Abs): 0.1 10*3/uL (ref 0.0–0.1)
Immature Granulocytes: 1 %
Lymphocytes Absolute: 2.1 10*3/uL (ref 0.7–3.1)
Lymphs: 22 %
MCH: 33.4 pg — ABNORMAL HIGH (ref 26.6–33.0)
MCHC: 34.6 g/dL (ref 31.5–35.7)
MCV: 97 fL (ref 79–97)
Monocytes Absolute: 0.5 10*3/uL (ref 0.1–0.9)
Monocytes: 5 %
Neutrophils Absolute: 6.8 10*3/uL (ref 1.4–7.0)
Neutrophils: 69 %
Platelets: 299 10*3/uL (ref 150–450)
RBC: 4.34 x10E6/uL (ref 3.77–5.28)
RDW: 12.8 % (ref 11.7–15.4)
WBC: 9.7 10*3/uL (ref 3.4–10.8)

## 2018-12-08 LAB — CA 125: Cancer Antigen (CA) 125: 29.2 U/mL (ref 0.0–38.1)

## 2018-12-20 ENCOUNTER — Telehealth: Payer: Self-pay | Admitting: Pulmonary Disease

## 2018-12-20 NOTE — Telephone Encounter (Signed)
FYI: I spoke with pt to get her rescheduled for lung cancer screening.  Pt states she does not with to reschedule or participate at this time.  Referral cancelled.

## 2018-12-27 ENCOUNTER — Telehealth: Payer: Self-pay

## 2018-12-27 NOTE — Telephone Encounter (Signed)
The pt left a message that she experienced some stroke like things this morning, that she had some double vision, blurred vision and that she wasn't having any residual effects.  The pt was advised to go to the ER for evaluation.

## 2019-01-03 ENCOUNTER — Telehealth: Payer: Self-pay

## 2019-01-03 ENCOUNTER — Emergency Department (HOSPITAL_COMMUNITY)
Admission: EM | Admit: 2019-01-03 | Discharge: 2019-01-03 | Disposition: A | Discharge status: Home / Self Care | Payer: Medicare Other | Attending: Emergency Medicine | Admitting: Emergency Medicine

## 2019-01-03 ENCOUNTER — Encounter (HOSPITAL_COMMUNITY): Payer: Self-pay | Admitting: Emergency Medicine

## 2019-01-03 ENCOUNTER — Other Ambulatory Visit: Payer: Self-pay

## 2019-01-03 ENCOUNTER — Emergency Department (HOSPITAL_COMMUNITY): Payer: Medicare Other

## 2019-01-03 DIAGNOSIS — R42 Dizziness and giddiness: Secondary | ICD-10-CM | POA: Diagnosis not present

## 2019-01-03 DIAGNOSIS — H539 Unspecified visual disturbance: Secondary | ICD-10-CM | POA: Diagnosis not present

## 2019-01-03 DIAGNOSIS — F17210 Nicotine dependence, cigarettes, uncomplicated: Secondary | ICD-10-CM | POA: Diagnosis not present

## 2019-01-03 DIAGNOSIS — Z79899 Other long term (current) drug therapy: Secondary | ICD-10-CM | POA: Diagnosis not present

## 2019-01-03 NOTE — ED Provider Notes (Signed)
[facility] DEPT Provider Note   CSN: [id number] Arrival date & time: 01/03/19  1334    History   Chief Complaint Chief Complaint  Patient presents with   Anxiety    HPI Tara Davis is a 73 y.o. female.     HPI Patient presents with vision changes.  1 week ago while looking at a computer she said the screen began to swell around like a kaleidoscope.  Felt a little dizzy with walking at that time.  States after that improved some she had double vision that was only in her left eye.  States she closed her left eyes she could see normally out of the right eye but if she opened the left eye for double vision in that eye.  States that also resolved.  No headache.  No confusion.  All defects have resolved.  States she is now more anxious however.  Does have history of anxiety.  States she called her primary care doctor a week ago and was told to come in the ER but she did not because she was nervous about a COVID infection at the hospital.  States she not talk to family ember who told her she had to come into the hospital. Past Medical History:  Diagnosis Date   Anxiety    Bilateral cataracts    within 6 mos.   Depression    GERD (gastroesophageal reflux disease)    Hyperlipidemia    Ulcer 1988    Patient Active Problem List   Diagnosis Date Noted   Tobacco abuse 01/12/2018   Cigarette smoker 01/12/2018   Pelvic pain 12/02/2016   NAUSEA WITH VOMITING 04/12/2010   IBS 07/05/2008    Past Surgical History:  Procedure Laterality Date   APPENDECTOMY     CATARACT EXTRACTION, BILATERAL     CHOLECYSTECTOMY     COLONOSCOPY     spontaneous vaginal delivery     x2   TONSILLECTOMY     WISDOM TOOTH EXTRACTION       OB History   No obstetric history on file.      Home Medications    Prior to Admission medications   Medication Sig Start Date End Date Taking? Authorizing Provider  atorvastatin (LIPITOR) 20 MG tablet TAKE 1  TABLET BY MOUTH EVERY DAY 12/06/18   Glendale Chard, MD  benzonatate (TESSALON) 100 MG capsule Take 1-2 capsules (100-200 mg total) by mouth 3 (three) times daily as needed for cough. 05/15/16   McVey, Gelene Mink, PA-C  fluticasone (FLONASE) 50 MCG/ACT nasal spray Place 2 sprays into both nostrils daily. 05/15/16   McVey, Gelene Mink, PA-C  Investigational vitamin D 600 UNITS capsule SWOG [phone] Take 600 Units by mouth daily. Take with food.    [provider]  Loperamide HCl (IMODIUM PO) Take by mouth daily.     [provider]  LORazepam (ATIVAN) 0.5 MG tablet Take 0.5 mg by mouth 2 (two) times daily.     [provider]  omeprazole (PRILOSEC) 40 MG capsule Take 1 capsule (40 mg total) by mouth daily. 11/30/18   Glendale Chard, MD  sertraline (ZOLOFT) 50 MG tablet Take 100 mg by mouth daily.     [provider]  Tiotropium Bromide Monohydrate (SPIRIVA RESPIMAT) 2.5 MCG/ACT AERS Inhale 2 puffs into the lungs daily. Patient not taking: Reported on 11/30/2018 01/13/18   Garner Nash, DO  VENTOLIN HFA 108 (90 Base) MCG/ACT inhaler INHALE 1 PUFF AS NEEDED 03/26/18  [provider]    Family History Family History  Problem Relation Age of Onset   Alzheimer's disease Mother    Depression Father    Diabetes Father    Breast cancer Sister    Colon cancer Neg Hx     Social History Social History   Tobacco Use   Smoking status: Current Every Day Smoker    Packs/day: 0.75    Years: 53.00    Pack years: 39.75    Types: Cigarettes   Smokeless tobacco: Never Used   Tobacco comment: smokes 5-7 cigarettes/day   Substance Use Topics   Alcohol use: Yes    Alcohol/week: 14.0 standard drinks    Types: 14 Shots of liquor per week   Drug use: No     Allergies   Codeine   Review of Systems Review of Systems  Constitutional: Negative for chills and unexpected weight change.  HENT: Negative for congestion.   Eyes: Positive for  visual disturbance.  Respiratory: Negative for shortness of breath.   Cardiovascular: Negative for chest pain.  Gastrointestinal: Negative for abdominal pain.  Genitourinary: Negative for flank pain.  Musculoskeletal: Negative for arthralgias.  Skin: Negative for rash.  Neurological: Positive for dizziness. Negative for speech difficulty.  Psychiatric/Behavioral: Negative for confusion.     Physical Exam Updated Vital Signs BP 130/84    Pulse 74    Temp 98.1 F (36.7 C) (Oral)    Resp 20    Ht 5\' 3"  (1.6 m)    Wt 86.2 kg    SpO2 95%    BMI 33.66 kg/m   Physical Exam Vitals signs and nursing note reviewed.  HENT:     Head: Atraumatic.  Eyes:     Extraocular Movements: Extraocular movements intact.     Pupils: Pupils are equal, round, and reactive to light.  Neck:     Musculoskeletal: Neck supple.  Cardiovascular:     Rate and Rhythm: Regular rhythm.  Abdominal:     Tenderness: There is abdominal tenderness.  Musculoskeletal:        General: No tenderness.     Right lower leg: No edema.     Left lower leg: No edema.  Skin:    General: Skin is warm.     Capillary Refill: Capillary refill takes less than 2 seconds.  Neurological:     General: No focal deficit present.     Mental Status: She is alert and oriented to person, place, and time.     Comments: Awake and appropriate.  Normal ambulation.  Eye movements intact.      ED Treatments / Results  Labs (all labs ordered are listed, but only abnormal results are displayed) Labs Reviewed - No data to display  EKG None  Radiology Mr Brain Wo Contrast  Result Date: 01/03/2019 CLINICAL DATA:  Transient visual disturbance 1 week ago with dizziness. EXAM: MRI HEAD WITHOUT CONTRAST TECHNIQUE: Multiplanar, multiecho pulse sequences of the brain and surrounding structures were obtained without intravenous contrast. COMPARISON:  None. FINDINGS: Brain: There is no evidence of acute infarct, intracranial hemorrhage, mass,  midline shift, or extra-axial fluid collection. The ventricles and sulci are within normal limits for age. Small T2 hyperintensity scattered throughout the cerebral white matter and in the pons are nonspecific but compatible with mildly age advanced chronic small vessel ischemic disease. Vascular: Major intracranial vascular flow voids are preserved. Skull and upper cervical spine: Unremarkable bone marrow signal. Sinuses/Orbits: Bilateral cataract extraction. Paranasal sinuses and mastoid air cells are  clear. Other: None. IMPRESSION: 1. No evidence of acute or subacute infarct. 2. Mild chronic small vessel ischemic disease. Electronically Signed   By: Logan Bores M.D.   On: 01/03/2019 20:31    Procedures Procedures (including critical care time)  Medications Ordered in ED Medications - No data to display   Initial Impression / Assessment and Plan / ED Course  I have reviewed the triage vital signs and the nursing notes.  Pertinent labs & imaging results that were available during my care of the patient were reviewed by me and considered in my medical decision making (see chart for details).        Patient had visual changes about a week ago.  Now at baseline.  No vision changes and otherwise normal neuro exam.  MRI done and was reassuring.  No acute stroke.  No subacute stroke.  With the time since the onset I feels the patient benefit from outpatient follow-up but does not need admission at this time.  Patient requested that she follow-up with Dr. Tomi Likens.  Discharge home.  Final Clinical Impressions(s) / ED Diagnoses   Final diagnoses:  Visual disturbance    ED Discharge Orders    None       Davonna Belling, MD 01/03/19 2142

## 2019-01-03 NOTE — ED Triage Notes (Signed)
1 week ago at about 1100, while at computer, the patient states the screen looked like a kaleidescope. Patient sat for a few moments then went to lay down. While walking she felt dizzy. She then had double vision for 5 mins and noticed her anxiety had elevated. MD called. MD patient to go to the ED that day. Patient did not come immediately out of fear.

## 2019-01-03 NOTE — Telephone Encounter (Signed)
Pt left message stating that she had stroke like even last week and was advised by KW to go to the er but she does not feel safe due to covid. I advised pt again to go to er but also scheduled her an appt. Pt wants referral to neurologist

## 2019-01-03 NOTE — Discharge Instructions (Signed)
Follow-up with neurology and your primary care doctor.

## 2019-01-03 NOTE — ED Notes (Signed)
Patient transported to MRI 

## 2019-01-03 NOTE — ED Triage Notes (Signed)
Patient states she has shallow breathing but does not appear in distress.

## 2019-01-06 ENCOUNTER — Ambulatory Visit: Payer: Medicare Other | Admitting: Internal Medicine

## 2019-01-10 DIAGNOSIS — F331 Major depressive disorder, recurrent, moderate: Secondary | ICD-10-CM | POA: Diagnosis not present

## 2019-01-16 NOTE — Progress Notes (Addendum)
Virtual Visit via Video Note The purpose of this virtual visit is to provide medical care while limiting exposure to the novel coronavirus.    Consent was obtained for video visit:  Yes Answered questions that patient had about telehealth interaction:  Yes I discussed the limitations, risks, security and privacy concerns of performing an evaluation and management service by telemedicine. I also discussed with the patient that there may be a patient responsible charge related to this service. The patient expressed understanding and agreed to proceed.  Pt location: Home Physician Location: Home Name of referring provider:  Glendale Chard, MD I connected with [name] at patients initiation/request on 01/17/2019 at  7:50 AM EDT by video enabled telemedicine application and verified that I am speaking with the correct person using two identifiers. Pt MRN:  [id number] Pt DOB:  22-Jun-1945 Video Participants:  [name]   History of Present Illness:  Arlone Lenhardt is a 73 year old female who presents for transient ischemic attack.  History supplemented by ED note.  In mid-July, she was looking at a computer when she noted that the screen became a kaleidoscope image with multiple images of the screen associated with dizziness.  Afterward, she noted double vision only in her left eye (double if she closed her right eye, normal if she closed her left eye).  They entire event lasted 15 to 20 minutes.  She noted mild left posterior headache behind the left ear.  She took ibuprofen and headache resolved in an hour.  No associated slurred speech, language disturbance or unilateral numbness or weakness.  At the time, she contacted her PCP who told her to go to the ED but declined due to fear of COVID infection in the hospital.  She presented to the ED a week later, on 01/03/19, after encouragement from her family.  She had an MRI of the brain without contrast which was personally reviewed and  demonstrated mild chronic small vessel ischemic changes but no evidence of acute or subacute infarct.  She was discharged in stable condition.    Many years ago, she reports probable migraines with associated nausea, vomiting, photophobia and phonophobia.  These last occurred in the late 1990s.    Of note, she had neurocognitive testing at around age 77, which demonstrated normal age-related memory loss.    Past Medical History: Past Medical History:  Diagnosis Date  . Anxiety   . Bilateral cataracts    within 6 mos.  . Depression   . GERD (gastroesophageal reflux disease)   . Hyperlipidemia   . Ulcer 1988    Medications: Outpatient Encounter Medications as of 01/17/2019  Medication Sig  . atorvastatin (LIPITOR) 20 MG tablet TAKE 1 TABLET BY MOUTH EVERY DAY  . benzonatate (TESSALON) 100 MG capsule Take 1-2 capsules (100-200 mg total) by mouth 3 (three) times daily as needed for cough.  . fluticasone (FLONASE) 50 MCG/ACT nasal spray Place 2 sprays into both nostrils daily.  . Investigational vitamin D 600 UNITS capsule SWOG [id number] Take 600 Units by mouth daily. Take with food.  . Loperamide HCl (IMODIUM PO) Take by mouth daily.   Marland Kitchen LORazepam (ATIVAN) 0.5 MG tablet Take 0.5 mg by mouth 2 (two) times daily.   Marland Kitchen omeprazole (PRILOSEC) 40 MG capsule Take 1 capsule (40 mg total) by mouth daily.  . sertraline (ZOLOFT) 50 MG tablet Take 100 mg by mouth daily.   . Tiotropium Bromide Monohydrate (SPIRIVA RESPIMAT) 2.5 MCG/ACT AERS Inhale 2 puffs into  the lungs daily. (Patient not taking: Reported on 11/30/2018)  . VENTOLIN HFA 108 (90 Base) MCG/ACT inhaler INHALE 1 PUFF AS NEEDED   No facility-administered encounter medications on file as of 01/17/2019.     Allergies: Allergies  Allergen Reactions  . Codeine     REACTION: hallucinations    Family History: Family History  Problem Relation Age of Onset  . Alzheimer's disease Mother   . Depression Father   . Diabetes Father   . Breast  cancer Sister   . Colon cancer Neg Hx     Social History: Social History   Socioeconomic History  . Marital status: Single    Spouse name: Not on file  . Number of children: Not on file  . Years of education: Not on file  . Highest education level: Not on file  Occupational History  . Not on file  Social Needs  . Financial resource strain: Not on file  . Food insecurity    Worry: Not on file    Inability: Not on file  . Transportation needs    Medical: Not on file    Non-medical: Not on file  Tobacco Use  . Smoking status: Current Every Day Smoker    Packs/day: 0.75    Years: 53.00    Pack years: 39.75    Types: Cigarettes  . Smokeless tobacco: Never Used  . Tobacco comment: smokes 5-7 cigarettes/day   Substance and Sexual Activity  . Alcohol use: Yes    Alcohol/week: 14.0 standard drinks    Types: 14 Shots of liquor per week  . Drug use: No  . Sexual activity: Yes  Lifestyle  . Physical activity    Days per week: Not on file    Minutes per session: Not on file  . Stress: Not on file  Relationships  . Social Herbalist on phone: Not on file    Gets together: Not on file    Attends religious service: Not on file    Active member of club or organization: Not on file    Attends meetings of clubs or organizations: Not on file    Relationship status: Not on file  . Intimate partner violence    Fear of current or ex partner: Not on file    Emotionally abused: Not on file    Physically abused: Not on file    Forced sexual activity: Not on file  Other Topics Concern  . Not on file  Social History Narrative  . Not on file    Observations/Objective:   Height 5\' 4"  (1.626 m), weight 190 lb (86.2 kg). No acute distress.  Alert and oriented.  Speech fluent and not dysarthric.  Language intact.  Eyes orthophoric on primary gaze.  Face symmetric.  Assessment and Plan:   Migraine with aura, without status migrainosus, not intractable/ocular migraine.   Monocular diplopia is not a typical symptom of an ischemic attack or carotid artery syndrom.  Also, she described seeing multiple images in one eye as well.  The negative MRI, associated headache and remote history of migraines supports diagnosis of migraine.  No further testing warranted.  As this was an isolated event, no pharmacologic management indicated.  She is encouraged to quit smoking, however.  She may follow up as needed.  Follow Up Instructions:    -I discussed the assessment and treatment plan with the patient. The patient was provided an opportunity to ask questions and all were answered. The patient agreed with  the plan and demonstrated an understanding of the instructions.   The patient was advised to call back or seek an in-person evaluation if the symptoms worsen or if the condition fails to improve as anticipated.  Dudley Major, DO

## 2019-01-17 ENCOUNTER — Telehealth (INDEPENDENT_AMBULATORY_CARE_PROVIDER_SITE_OTHER): Payer: Medicare Other | Admitting: Neurology

## 2019-01-17 ENCOUNTER — Other Ambulatory Visit: Payer: Self-pay

## 2019-01-17 ENCOUNTER — Encounter: Payer: Self-pay | Admitting: Neurology

## 2019-01-17 VITALS — Ht 64.0 in | Wt 190.0 lb

## 2019-01-17 DIAGNOSIS — G43109 Migraine with aura, not intractable, without status migrainosus: Secondary | ICD-10-CM | POA: Diagnosis not present

## 2019-02-09 ENCOUNTER — Ambulatory Visit: Payer: Self-pay

## 2019-02-09 ENCOUNTER — Telehealth: Payer: Self-pay

## 2019-02-09 ENCOUNTER — Encounter: Payer: Self-pay | Admitting: Internal Medicine

## 2019-02-09 NOTE — Telephone Encounter (Signed)
This nurse attempted to call patient in order to follow up in regards to missed AWV this morning. Message left for patient to call I order to reschedule for another time.

## 2019-02-23 ENCOUNTER — Telehealth: Payer: Self-pay | Admitting: Internal Medicine

## 2019-02-23 NOTE — Telephone Encounter (Signed)
I left a message asking the patient to call me to reschedule AWV and CPE.

## 2019-03-14 DIAGNOSIS — F331 Major depressive disorder, recurrent, moderate: Secondary | ICD-10-CM | POA: Diagnosis not present

## 2019-03-21 DIAGNOSIS — F331 Major depressive disorder, recurrent, moderate: Secondary | ICD-10-CM | POA: Diagnosis not present

## 2019-03-21 DIAGNOSIS — F419 Anxiety disorder, unspecified: Secondary | ICD-10-CM | POA: Diagnosis not present

## 2019-03-22 ENCOUNTER — Ambulatory Visit (INDEPENDENT_AMBULATORY_CARE_PROVIDER_SITE_OTHER): Payer: Medicare Other

## 2019-03-22 ENCOUNTER — Other Ambulatory Visit: Payer: Self-pay

## 2019-03-22 VITALS — BP 116/70 | HR 71 | Temp 98.4°F | Ht 63.6 in | Wt 192.6 lb

## 2019-03-22 DIAGNOSIS — Z23 Encounter for immunization: Secondary | ICD-10-CM | POA: Diagnosis not present

## 2019-03-22 NOTE — Progress Notes (Signed)
Flu shot

## 2019-04-13 DIAGNOSIS — F331 Major depressive disorder, recurrent, moderate: Secondary | ICD-10-CM | POA: Diagnosis not present

## 2019-04-21 ENCOUNTER — Other Ambulatory Visit: Payer: Self-pay

## 2019-04-21 ENCOUNTER — Telehealth: Payer: Self-pay

## 2019-04-21 ENCOUNTER — Encounter: Payer: Self-pay | Admitting: Internal Medicine

## 2019-04-21 ENCOUNTER — Telehealth (INDEPENDENT_AMBULATORY_CARE_PROVIDER_SITE_OTHER): Payer: Medicare Other | Admitting: Internal Medicine

## 2019-04-21 DIAGNOSIS — L659 Nonscarring hair loss, unspecified: Secondary | ICD-10-CM

## 2019-04-21 DIAGNOSIS — K591 Functional diarrhea: Secondary | ICD-10-CM | POA: Diagnosis not present

## 2019-04-21 DIAGNOSIS — R112 Nausea with vomiting, unspecified: Secondary | ICD-10-CM | POA: Diagnosis not present

## 2019-04-21 DIAGNOSIS — R42 Dizziness and giddiness: Secondary | ICD-10-CM | POA: Diagnosis not present

## 2019-04-21 NOTE — Telephone Encounter (Signed)
The pt said that she was having issues with vomiting and stomach pain.  The pt was asked if she would like a virtual appt and the pt said yes.  The appt was scheduled.

## 2019-04-21 NOTE — Patient Instructions (Signed)

## 2019-04-21 NOTE — Progress Notes (Signed)
Virtual Visit via Phone   This visit type was conducted due to national recommendations for restrictions regarding the COVID-19 Pandemic (e.g. social distancing) in an effort to limit this patient's exposure and mitigate transmission in our community.  Due to her co-morbid illnesses, this patient is at least at moderate risk for complications without adequate follow up.  This format is felt to be most appropriate for this patient at this time.  All issues noted in this document were discussed and addressed.  A limited physical exam was performed with this format.    This visit type was conducted due to national recommendations for restrictions regarding the COVID-19 Pandemic (e.g. social distancing) in an effort to limit this patient's exposure and mitigate transmission in our community.  Patients identity confirmed using two different identifiers.  This format is felt to be most appropriate for this patient at this time.  All issues noted in this document were discussed and addressed.  No physical exam was performed (except for noted visual exam findings with Video Visits).    Date:  04/21/2019   ID:  Tara Davis, DOB Feb 25, 1946, MRN [id number]  Patient Location:  Home - identity confirmed using two identifiers.   Provider location:   Office    Chief Complaint:  "I have nausea/vomiting"   History of Present Illness:    Tara Davis is a 73 y.o. female who presents via video conferencing for a telehealth visit today.    The patient does not have symptoms concerning for COVID-19 infection (fever, chills, cough, or new shortness of breath).   She presents today for virtual visit. She prefers this method of contact due to COVID-19 pandemic.  She presents today for nausea, vomiting and diarrhea. She admits that she has not been taking her reflux medication. She admits that "it didn't cross her mind" that she needed to take this. She is unable to state why she stopped the medication. She  states her sx started about  2-3 weeks ago. No associated fever/chills. She has not been tested for COVID. She denies cough and shortness of breath.     Past Medical History:  Diagnosis Date  . Anxiety   . Bilateral cataracts    within 6 mos.  . Depression   . GERD (gastroesophageal reflux disease)   . Hyperlipidemia   . Ulcer 1988   Past Surgical History:  Procedure Laterality Date  . APPENDECTOMY    . CATARACT EXTRACTION, BILATERAL    . CHOLECYSTECTOMY    . COLONOSCOPY    . spontaneous vaginal delivery     x2  . TONSILLECTOMY    . WISDOM TOOTH EXTRACTION       Current Meds  Medication Sig  . atorvastatin (LIPITOR) 20 MG tablet TAKE 1 TABLET BY MOUTH EVERY DAY  . Investigational vitamin D 600 UNITS capsule SWOG [id number] Take 600 Units by mouth daily. Take with food.  . Loperamide HCl (IMODIUM PO) Take by mouth daily.   Marland Kitchen LORazepam (ATIVAN) 0.5 MG tablet Take 0.5 mg by mouth 2 (two) times daily. Take 1 tablet in the morning and 2 tablets in the evening  . omeprazole (PRILOSEC) 40 MG capsule Take 1 capsule (40 mg total) by mouth daily.  Marland Kitchen vortioxetine HBr (TRINTELLIX) 5 MG TABS tablet Take 5 mg by mouth daily.     Allergies:   Codeine   Social History   Tobacco Use  . Smoking status: Current Every Day Smoker    Packs/day: 0.75  Years: 53.00    Pack years: 39.75    Types: Cigarettes  . Smokeless tobacco: Never Used  . Tobacco comment: smokes 5-7 cigarettes/day   Substance Use Topics  . Alcohol use: Yes    Alcohol/week: 14.0 standard drinks    Types: 14 Shots of liquor per week  . Drug use: No     Family Hx: The patient's family history includes Alzheimer's disease in her mother; Breast cancer in her sister; Depression in her father; Diabetes in her father. There is no history of Colon cancer.  ROS:   Please see the history of present illness.    Review of Systems  Constitutional: Negative.   Respiratory: Negative.   Cardiovascular: Negative.    Gastrointestinal: Positive for diarrhea, nausea and vomiting.  Skin:       She also c/o hair loss.  Admits she has not been eating much due to GI sx. She is not taking vitamins.   Neurological: Negative.   Psychiatric/Behavioral: Negative.     All other systems reviewed and are negative.   Labs/Other Tests and Data Reviewed:    Recent Labs: 12/07/2018: ALT 30; BUN 15; Creatinine, Ser 0.91; Hemoglobin 14.5; Platelets 299; Potassium 4.5; Sodium 142   Recent Lipid Panel Lab Results  Component Value Date/Time   CHOL (H) 01/08/2008 04:00 AM    256        ATP III CLASSIFICATION:  <200     mg/dL   Desirable  200-239  mg/dL   Borderline High  >=240    mg/dL   High   TRIG 257 (H) 01/08/2008 04:00 AM   HDL 37 (L) 01/08/2008 04:00 AM   CHOLHDL 6.9 01/08/2008 04:00 AM   LDLCALC (H) 01/08/2008 04:00 AM    168        Total Cholesterol/HDL:CHD Risk Coronary Heart Disease Risk Table                     Men   Women  1/2 Average Risk   3.4   3.3    Wt Readings from Last 3 Encounters:  03/22/19 192 lb 9.6 oz (87.4 kg)  01/17/19 190 lb (86.2 kg)  01/03/19 190 lb (86.2 kg)     Exam:    Vital Signs:  There were no vitals taken for this visit.    Physical Exam  Pulmonary/Chest: Effort normal.  PE not performed, this is phone visit.   ASSESSMENT & PLAN:    1. Non-intractable vomiting with nausea, unspecified vomiting type  I think her sx are due to uncontrolled GERD. I will resume omeprazole 40mg  daily. If her sx persist, I suggest GI evaluation, which may include EGD.  Due to the pandemic, she declines to come in for in-person evaluation. Pt reminded that it is difficult to assess certain things by phone.   2. Dizziness  Likely due to dehydration. She is encouraged to drink increase fluids and electrolyte drinks.   3. Functional diarrhea  She is advised to eat masked/baked potatoes to help decrease her symptoms. If persistent, she may benefit from Bentyl prn.    4. Hair loss   Possibly due to poor nutrition. She declines having bloodwork at this time. She will let me know if her sx persist.     COVID-19 Education: The signs and symptoms of COVID-19 were discussed with the patient and how to seek care for testing (follow up with PCP or arrange E-visit).  The importance of social distancing was discussed today.  Patient Risk:   After full review of this patients clinical status, I feel that they are at least moderate risk at this time.  Time:   Today, I have spent 21 minutes/ seconds with the patient with telehealth technology discussing above diagnoses.     Medication Adjustments/Labs and Tests Ordered: Current medicines are reviewed at length with the patient today.  Concerns regarding medicines are outlined above.   Tests Ordered: No orders of the defined types were placed in this encounter.   Medication Changes: No orders of the defined types were placed in this encounter.   Disposition:  Follow up prn  Signed, Maximino Greenland, MD

## 2019-04-22 ENCOUNTER — Other Ambulatory Visit: Payer: Self-pay

## 2019-04-22 DIAGNOSIS — Z20822 Contact with and (suspected) exposure to covid-19: Secondary | ICD-10-CM

## 2019-04-24 LAB — NOVEL CORONAVIRUS, NAA: SARS-CoV-2, NAA: NOT DETECTED

## 2019-05-03 ENCOUNTER — Telehealth: Payer: Self-pay

## 2019-05-03 NOTE — Telephone Encounter (Signed)
Cramping, diarrhea everyday starting in the morning, calms down in the evening. vomiting 2x's last couple of days. Specific soreness in lower part of belly as if she has a bruise but no bruise on belly anywhere.  Has tried Ginger Tea and Ginger ale also multi vitamins and omeprazole. Does not seem to be getting any better

## 2019-05-03 NOTE — Telephone Encounter (Signed)
Please contact pt - who is her GI physician? Has she contacted them to schedule an appointment?

## 2019-05-04 NOTE — Telephone Encounter (Signed)
She seen [facility] 2 yrs ago and they ruled out diverticulitis, she has not contacted them because she didn't remember who she had seen, but she is going to give them a call to see if she can schedule an appt with them

## 2019-05-09 ENCOUNTER — Telehealth: Payer: Self-pay

## 2019-05-09 NOTE — Telephone Encounter (Signed)
Called to check on pt, also to see if pt scheduled GI appt. LVM, pt does have upcoming appt 05/26/2019 with GI

## 2019-05-09 NOTE — Telephone Encounter (Signed)
The pt returned a call to the office and said that she is doing ok and that she got an appt with her GI doctor Dr. Ardis Hughs  the same day of her appointment with Dr. Baird Cancer.

## 2019-05-10 DIAGNOSIS — F419 Anxiety disorder, unspecified: Secondary | ICD-10-CM | POA: Diagnosis not present

## 2019-05-10 DIAGNOSIS — F3341 Major depressive disorder, recurrent, in partial remission: Secondary | ICD-10-CM | POA: Diagnosis not present

## 2019-05-11 DIAGNOSIS — F331 Major depressive disorder, recurrent, moderate: Secondary | ICD-10-CM | POA: Diagnosis not present

## 2019-05-26 ENCOUNTER — Other Ambulatory Visit: Payer: Self-pay

## 2019-05-26 ENCOUNTER — Ambulatory Visit (INDEPENDENT_AMBULATORY_CARE_PROVIDER_SITE_OTHER): Payer: Medicare Other

## 2019-05-26 ENCOUNTER — Ambulatory Visit: Payer: Medicare Other | Admitting: Gastroenterology

## 2019-05-26 ENCOUNTER — Encounter: Payer: Self-pay | Admitting: Internal Medicine

## 2019-05-26 VITALS — BP 118/87 | HR 78 | Ht 63.0 in | Wt 195.0 lb

## 2019-05-26 DIAGNOSIS — Z0000 Encounter for general adult medical examination without abnormal findings: Secondary | ICD-10-CM | POA: Diagnosis not present

## 2019-05-26 NOTE — Patient Instructions (Signed)
Ms. Tara Davis , Thank you for taking time to come for your Medicare Wellness Visit. I appreciate your ongoing commitment to your health goals. Please review the following plan we discussed and let me know if I can assist you in the future.   Screening recommendations/referrals: Colonoscopy: 11/2016 Mammogram: scheduled for [facility] Bone Density: in past three years per patient Recommended yearly ophthalmology/optometry visit for glaucoma screening and checkup Recommended yearly dental visit for hygiene and checkup  Vaccinations: Influenza vaccine: 03/2019 Pneumococcal vaccine: 01/2017 Tdap vaccine: 03/2011 Shingles vaccine: discussed    Advanced directives: Please bring a copy of your POA (Power of [place]) and/or Living Will to your next appointment.    Conditions/risks identified: obesity  Next appointment: 06/20/2019 at 2:45   Preventive Care 72 Years and Older, Female Preventive care refers to lifestyle choices and visits with your health care provider that can promote health and wellness. What does preventive care include?  A yearly physical exam. This is also called an annual well check.  Dental exams once or twice a year.  Routine eye exams. Ask your health care provider how often you should have your eyes checked.  Personal lifestyle choices, including:  Daily care of your teeth and gums.  Regular physical activity.  Eating a healthy diet.  Avoiding tobacco and drug use.  Limiting alcohol use.  Practicing safe sex.  Taking low-dose aspirin every day.  Taking vitamin and mineral supplements as recommended by your health care provider. What happens during an annual well check? The services and screenings done by your health care provider during your annual well check will depend on your age, overall health, lifestyle risk factors, and family history of disease. Counseling  Your health care provider may ask you questions about your:  Alcohol use.  Tobacco  use.  Drug use.  Emotional well-being.  Home and relationship well-being.  Sexual activity.  Eating habits.  History of falls.  Memory and ability to understand (cognition).  Work and work Statistician.  Reproductive health. Screening  You may have the following tests or measurements:  Height, weight, and BMI.  Blood pressure.  Lipid and cholesterol levels. These may be checked every 5 years, or more frequently if you are over 46 years old.  Skin check.  Lung cancer screening. You may have this screening every year starting at age 72 if you have a 30-pack-year history of smoking and currently smoke or have quit within the past 15 years.  Fecal occult blood test (FOBT) of the stool. You may have this test every year starting at age 46.  Flexible sigmoidoscopy or colonoscopy. You may have a sigmoidoscopy every 5 years or a colonoscopy every 10 years starting at age 21.  Hepatitis C blood test.  Hepatitis B blood test.  Sexually transmitted disease (STD) testing.  Diabetes screening. This is done by checking your blood sugar (glucose) after you have not eaten for a while (fasting). You may have this done every 1-3 years.  Bone density scan. This is done to screen for osteoporosis. You may have this done starting at age 43.  Mammogram. This may be done every 1-2 years. Talk to your health care provider about how often you should have regular mammograms. Talk with your health care provider about your test results, treatment options, and if necessary, the need for more tests. Vaccines  Your health care provider may recommend certain vaccines, such as:  Influenza vaccine. This is recommended every year.  Tetanus, diphtheria, and acellular pertussis (Tdap, Td)  vaccine. You may need a Td booster every 10 years.  Zoster vaccine. You may need this after age 29.  Pneumococcal 13-valent conjugate (PCV13) vaccine. One dose is recommended after age 72.  Pneumococcal  polysaccharide (PPSV23) vaccine. One dose is recommended after age 30. Talk to your health care provider about which screenings and vaccines you need and how often you need them. This information is not intended to replace advice given to you by your health care provider. Make sure you discuss any questions you have with your health care provider. Document Released: 06/22/2015 Document Revised: 02/13/2016 Document Reviewed: 03/27/2015 Elsevier Interactive Patient Education  2017 [employer] Prevention in the Home Falls can cause injuries. They can happen to people of all ages. There are many things you can do to make your home safe and to help prevent falls. What can I do on the outside of my home?  Regularly fix the edges of walkways and driveways and fix any cracks.  Remove anything that might make you trip as you walk through a door, such as a raised step or threshold.  Trim any bushes or trees on the path to your home.  Use bright outdoor lighting.  Clear any walking paths of anything that might make someone trip, such as rocks or tools.  Regularly check to see if handrails are loose or broken. Make sure that both sides of any steps have handrails.  Any raised decks and porches should have guardrails on the edges.  Have any leaves, snow, or ice cleared regularly.  Use sand or salt on walking paths during winter.  Clean up any spills in your garage right away. This includes oil or grease spills. What can I do in the bathroom?  Use night lights.  Install grab bars by the toilet and in the tub and shower. Do not use towel bars as grab bars.  Use non-skid mats or decals in the tub or shower.  If you need to sit down in the shower, use a plastic, non-slip stool.  Keep the floor dry. Clean up any water that spills on the floor as soon as it happens.  Remove soap buildup in the tub or shower regularly.  Attach bath mats securely with double-sided non-slip rug  tape.  Do not have throw rugs and other things on the floor that can make you trip. What can I do in the bedroom?  Use night lights.  Make sure that you have a light by your bed that is easy to reach.  Do not use any sheets or blankets that are too big for your bed. They should not hang down onto the floor.  Have a firm chair that has side arms. You can use this for support while you get dressed.  Do not have throw rugs and other things on the floor that can make you trip. What can I do in the kitchen?  Clean up any spills right away.  Avoid walking on wet floors.  Keep items that you use a lot in easy-to-reach places.  If you need to reach something above you, use a strong step stool that has a grab bar.  Keep electrical cords out of the way.  Do not use floor polish or wax that makes floors slippery. If you must use wax, use non-skid floor wax.  Do not have throw rugs and other things on the floor that can make you trip. What can I do with my stairs?  Do not leave  any items on the stairs.  Make sure that there are handrails on both sides of the stairs and use them. Fix handrails that are broken or loose. Make sure that handrails are as long as the stairways.  Check any carpeting to make sure that it is firmly attached to the stairs. Fix any carpet that is loose or worn.  Avoid having throw rugs at the top or bottom of the stairs. If you do have throw rugs, attach them to the floor with carpet tape.  Make sure that you have a light switch at the top of the stairs and the bottom of the stairs. If you do not have them, ask someone to add them for you. What else can I do to help prevent falls?  Wear shoes that:  Do not have high heels.  Have rubber bottoms.  Are comfortable and fit you well.  Are closed at the toe. Do not wear sandals.  If you use a stepladder:  Make sure that it is fully opened. Do not climb a closed stepladder.  Make sure that both sides of the  stepladder are locked into place.  Ask someone to hold it for you, if possible.  Clearly mark and make sure that you can see:  Any grab bars or handrails.  First and last steps.  Where the edge of each step is.  Use tools that help you move around (mobility aids) if they are needed. These include:  Canes.  Walkers.  Scooters.  Crutches.  Turn on the lights when you go into a dark area. Replace any light bulbs as soon as they burn out.  Set up your furniture so you have a clear path. Avoid moving your furniture around.  If any of your floors are uneven, fix them.  If there are any pets around you, be aware of where they are.  Review your medicines with your doctor. Some medicines can make you feel dizzy. This can increase your chance of falling. Ask your doctor what other things that you can do to help prevent falls. This information is not intended to replace advice given to you by your health care provider. Make sure you discuss any questions you have with your health care provider. Document Released: 03/22/2009 Document Revised: 11/01/2015 Document Reviewed: 06/30/2014 Elsevier Interactive Patient Education  2017 [employer].

## 2019-05-26 NOTE — Progress Notes (Signed)
This visit type was conducted due to national recommendations for restrictions regarding the COVID-19 Pandemic (e.g. social distancing). This format is felt to be most appropriate for this patient at this time. All issues noted in this document were discussed and addressed. No physical exam was performed (except for noted visual exam findings with Video Visits). This patient has given permission to perform this visit via telephone. Vital signs may be absent or patient reported.  Patient location:  At home  Nurse location:  [facility] office    Subjective:   Tara Davis is a 73 y.o. female who presents for Medicare Annual (Subsequent) preventive examination.  Review of Systems:  n/a Cardiac Risk Factors include: advanced age (>49mn, >>61women);smoking/ tobacco exposure;obesity (BMI >30kg/m2)     Objective:     Vitals: BP 118/87 Comment: per patient  Pulse 78 Comment: per patient  Ht 5' 3"  (1.6 m) Comment: per patient  Wt 195 lb (88.5 kg) Comment: per patient  BMI 34.54 kg/m   Body mass index is 34.54 kg/m.  Advanced Directives 05/26/2019 01/17/2019 11/07/2016 11/04/2016 07/21/2016  Does Patient Have a Medical Advance Directive? Yes Yes No No No  Type of [place]amed[place] will [place] will - - -  Does patient want to make changes to medical advance directive? - No - Patient declined - - -  Copy of [place] Chart? No - copy requested No - copy requested - - -    Tobacco Social History   Tobacco Use  Smoking Status Current Every Day Smoker  . Packs/day: 0.50  . Years: 53.00  . Pack years: 26.50  . Types: Cigarettes  Smokeless Tobacco Never Used     Ready to quit: Yes Counseling given: Not Answered   Clinical Intake:  Pre-visit preparation completed: Yes  Pain : No/denies pain     Nutritional Status: BMI > 30  Obese Nutritional Risks: None Diabetes: No  How often do you need to  have someone help you when you read instructions, pamphlets, or other written materials from your doctor or pharmacy?: 1 - Never What is the last grade level you completed in school?: bachelor's degree  Interpreter Needed?: No  Information entered by :: NAllen LPN  Past Medical History:  Diagnosis Date  . Anxiety   . Bilateral cataracts    within 6 mos.  . Depression   . GERD (gastroesophageal reflux disease)   . Hyperlipidemia   . Ulcer 1988   Past Surgical History:  Procedure Laterality Date  . APPENDECTOMY    . CATARACT EXTRACTION, BILATERAL    . CHOLECYSTECTOMY    . COLONOSCOPY    . spontaneous vaginal delivery     x2  . TONSILLECTOMY    . WISDOM TOOTH EXTRACTION     Family History  Problem Relation Age of Onset  . Alzheimer's disease Mother   . Depression Father   . Diabetes Father   . Breast cancer Sister   . Colon cancer Neg Hx    Social History   Socioeconomic History  . Marital status: Single    Spouse name: Not on file  . Number of children: 2  . Years of education: 16  . Highest education level: Bachelor's degree (e.g., BA, AB, BS)  Occupational History  . 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 Smoking status: Current Every Day Smoker    Packs/day: 0.50    Years: 53.00  Pack years: 26.50    Types: Cigarettes  . Smokeless tobacco: Never Used  Substance and Sexual Activity  . Alcohol use: Yes    Alcohol/week: 10.0 standard drinks    Types: 10 Shots of liquor per week  . Drug use: No  . Sexual activity: Not Currently  Other Topics Concern  . Not on file  Social History Narrative   Patient is right-handed. She lives with her long time boyfriend of 25+ years ina 2 level home. She drinks green tea, 2-4 cups a day. She does not exercise.   Social Determinants of Health   Financial Resource Strain: Low Risk   . Difficulty of Paying Living Expenses: Not hard at all  Food Insecurity: No Food Insecurity  . Worried About  Charity fundraiser in the Last Year: Never true  . Ran Out of Food in the Last Year: Never true  Transportation Needs: No Transportation Needs  . Lack of Transportation (Medical): No  . Lack of Transportation (Non-Medical): No  Physical Activity: Insufficiently Active  . Days of Exercise per Week: 7 days  . Minutes of Exercise per Session: 20 min  Stress: No Stress Concern Present  . Feeling of Stress : Not at all  Social Connections:   . Frequency of Communication with Friends and Family: Not on file  . Frequency of Social Gatherings with Friends and Family: Not on file  . Attends Religious Services: Not on file  . Active Member of Clubs or Organizations: Not on file  . Attends Archivist Meetings: Not on file  . Marital Status: Not on file    Outpatient Encounter Medications as of 05/26/2019  Medication Sig  . atorvastatin (LIPITOR) 20 MG tablet TAKE 1 TABLET BY MOUTH EVERY DAY  . Investigational vitamin D 600 UNITS capsule SWOG S0812 Take 600 Units by mouth daily. Take with food.  . Loperamide HCl (IMODIUM PO) Take by mouth daily.   Marland Kitchen LORazepam (ATIVAN) 0.5 MG tablet Take 0.5 mg by mouth 2 (two) times daily. Take 1 tablet in the morning and 2 tablets in the evening  . omeprazole (PRILOSEC) 40 MG capsule Take 1 capsule (40 mg total) by mouth daily.  . VENTOLIN HFA 108 (90 Base) MCG/ACT inhaler INHALE 1 PUFF AS NEEDED  . vortioxetine HBr (TRINTELLIX) 5 MG TABS tablet Take 5 mg by mouth daily.  . benzonatate (TESSALON) 100 MG capsule Take 1-2 capsules (100-200 mg total) by mouth 3 (three) times daily as needed for cough. (Patient not taking: Reported on 04/21/2019)  . fluticasone (FLONASE) 50 MCG/ACT nasal spray Place 2 sprays into both nostrils daily. (Patient not taking: Reported on 04/21/2019)  . sertraline (ZOLOFT) 50 MG tablet Take 100 mg by mouth daily.   . Tiotropium Bromide Monohydrate (SPIRIVA RESPIMAT) 2.5 MCG/ACT AERS Inhale 2 puffs into the lungs daily. (Patient  not taking: Reported on 11/30/2018)   No facility-administered encounter medications on file as of 05/26/2019.    Activities of Daily Living In your present state of health, do you have any difficulty performing the following activities: 05/26/2019 04/21/2019  Hearing? N N  Vision? N N  Difficulty concentrating or making decisions? N Y  Walking or climbing stairs? N N  Dressing or bathing? N N  Doing errands, shopping? N N  Preparing Food and eating ? N -  Using the Toilet? N -  In the past six months, have you accidently leaked urine? Y -  Do you have problems with loss of bowel  control? N -  Managing your Medications? N -  Managing your Finances? N -  Housekeeping or managing your Housekeeping? N -  Some recent data might be hidden    Patient Care Team: Glendale Chard, MD as PCP - General (Internal Medicine) Pieter Partridge, DO as Consulting Physician (Neurology)    Assessment:   This is a routine wellness examination for [place].  Exercise Activities and Dietary recommendations Current Exercise Habits: Home exercise routine, Type of exercise: yoga(tai chi), Time (Minutes): 20, Frequency (Times/Week): 7, Weekly Exercise (Minutes/Week): 140  Goals    . Quit Smoking     05/26/2019 quit smoking    . Weight (lb) < 200 lb (90.7 kg)     05/26/2019, wants to weigh 150 pounds        Fall Risk Fall Risk  05/26/2019 04/21/2019 01/17/2019 11/30/2018 04/13/2018  Falls in the past year? 0 0 0 0 0  Risk for fall due to : Medication side effect - - - -  Follow up Falls evaluation completed;Education provided;Falls prevention discussed - Falls evaluation completed - -   Is the patient's home free of loose throw rugs in walkways, pet beds, electrical cords, etc?   yes      Grab bars in the bathroom? no      Handrails on the stairs?   yes      Adequate lighting?   yes  Timed Get Up and Go performed: n/a  Depression Screen PHQ 2/9 Scores 05/26/2019 04/21/2019 11/30/2018 04/13/2018  PHQ  - 2 Score 0 5 0 0  PHQ- 9 Score 3 11 - -     Cognitive Function     6CIT Screen 05/26/2019  What Year? 0 points  What month? 0 points  What time? 0 points  Count back from 20 0 points  Months in reverse 0 points  Repeat phrase 0 points  Total Score 0    Immunization History  Administered Date(s) Administered  . Influenza, High Dose Seasonal PF 02/02/2017, 03/22/2019  . Influenza,inj,Quad PF,6+ Mos 05/20/2013  . Influenza-Unspecified 02/27/2016, 02/12/2018  . Pneumococcal Polysaccharide-23 02/02/2017  . Zoster Recombinat (Shingrix) 07/31/2017    Qualifies for Shingles Vaccine? yes  Screening Tests Health Maintenance  Topic Date Due  . MAMMOGRAM  11/11/1996  . DEXA SCAN  05/25/2020 (Originally 11/12/2011)  . COLONOSCOPY  11/08/2019  . TETANUS/TDAP  03/09/2021  . INFLUENZA VACCINE  Completed  . Hepatitis C Screening  Completed  . PNA vac Low Risk Adult  Completed    Cancer Screenings: Lung: Low Dose CT Chest recommended if Age 79-80 years, 30 pack-year currently smoking OR have quit w/in 15years. Patient does not qualify. Breast:  Up to date on Mammogram? No   Up to date of Bone Density/Dexa? Yes Colorectal: up to date  Additional Screenings: : Hepatitis C Screening: 03/10/2016     Plan:    Wants to quit smoking and get down to 150 pounds.   I have personally reviewed and noted the following in the patient's chart:   . Medical and social history . Use of alcohol, tobacco or illicit drugs  . Current medications and supplements . Functional ability and status . Nutritional status . Physical activity . Advanced directives . List of other physicians . Hospitalizations, surgeries, and ER visits in previous 12 months . Vitals . Screenings to include cognitive, depression, and falls . Referrals and appointments  In addition, I have reviewed and discussed with patient certain preventive protocols, quality metrics, and best practice  recommendations. A written  personalized care plan for preventive services as well as general preventive health recommendations were provided to patient.     Kellie Simmering, LPN  [id number]

## 2019-06-07 ENCOUNTER — Encounter: Payer: Self-pay | Admitting: Internal Medicine

## 2019-06-08 ENCOUNTER — Telehealth: Payer: Self-pay

## 2019-06-08 ENCOUNTER — Other Ambulatory Visit: Payer: Self-pay | Admitting: Internal Medicine

## 2019-06-08 NOTE — Telephone Encounter (Signed)
Called pt regarding her concerns about vaccine. Pt understands that we do not have the vaccine and we do not have information for pt at this time. Pt is not satisfied with [facility] or the government with how covid is being handled.

## 2019-06-16 ENCOUNTER — Encounter: Payer: Self-pay | Admitting: *Deleted

## 2019-06-16 ENCOUNTER — Encounter: Payer: Self-pay | Admitting: Internal Medicine

## 2019-06-20 ENCOUNTER — Encounter: Payer: Self-pay | Admitting: Internal Medicine

## 2019-06-22 ENCOUNTER — Ambulatory Visit: Payer: Medicare Other | Admitting: Gastroenterology

## 2019-07-06 DIAGNOSIS — F331 Major depressive disorder, recurrent, moderate: Secondary | ICD-10-CM | POA: Diagnosis not present

## 2019-07-06 DIAGNOSIS — F419 Anxiety disorder, unspecified: Secondary | ICD-10-CM | POA: Diagnosis not present

## 2019-07-26 ENCOUNTER — Other Ambulatory Visit: Payer: Self-pay

## 2019-07-26 DIAGNOSIS — F331 Major depressive disorder, recurrent, moderate: Secondary | ICD-10-CM | POA: Diagnosis not present

## 2019-07-26 MED ORDER — ATORVASTATIN CALCIUM 20 MG PO TABS: 20.0000 mg | ORAL_TABLET | Freq: Every day | ORAL | 1 refills | Status: DC

## 2019-07-27 ENCOUNTER — Other Ambulatory Visit: Payer: Self-pay

## 2019-07-27 MED ORDER — OMEPRAZOLE 40 MG PO CPDR: 40.0000 mg | DELAYED_RELEASE_CAPSULE | Freq: Every day | ORAL | 0 refills | Status: DC

## 2019-08-02 ENCOUNTER — Ambulatory Visit: Payer: Medicare Other | Admitting: Gastroenterology

## 2019-08-09 DIAGNOSIS — F331 Major depressive disorder, recurrent, moderate: Secondary | ICD-10-CM | POA: Diagnosis not present

## 2019-08-09 DIAGNOSIS — F419 Anxiety disorder, unspecified: Secondary | ICD-10-CM | POA: Diagnosis not present

## 2019-08-23 ENCOUNTER — Ambulatory Visit: Payer: Medicare Other | Admitting: Gastroenterology

## 2019-08-23 ENCOUNTER — Encounter: Payer: Self-pay | Admitting: Gastroenterology

## 2019-08-23 VITALS — BP 130/80 | HR 71 | Temp 98.2°F | Ht 63.0 in | Wt 196.0 lb

## 2019-08-23 DIAGNOSIS — K219 Gastro-esophageal reflux disease without esophagitis: Secondary | ICD-10-CM | POA: Diagnosis not present

## 2019-08-23 DIAGNOSIS — K580 Irritable bowel syndrome with diarrhea: Secondary | ICD-10-CM

## 2019-08-23 MED ORDER — OMEPRAZOLE 40 MG PO CPDR: 40.0000 mg | DELAYED_RELEASE_CAPSULE | Freq: Two times a day (BID) | ORAL | 1 refills | Status: DC

## 2019-08-23 NOTE — Patient Instructions (Signed)
If you are age 73 or older, your body mass index should be between 23-30. Your Body mass index is 34.72 kg/m. If this is out of the aforementioned range listed, please consider follow up with your Primary Care Provider.  If you are age 80 or younger, your body mass index should be between 19-25. Your Body mass index is 34.72 kg/m. If this is out of the aformentioned range listed, please consider follow up with your Primary Care Provider.   Start Benefiber 2 tablespoons in 8-16 oz of liquid daily.   Increase Omeprazole twice daily for 6 weeks, then decrease back to once daily.

## 2019-08-23 NOTE — Progress Notes (Signed)
08/23/2019 [name] [id number] Dec 11, 1945   HISTORY OF PRESENT ILLNESS: This is a very pleasant 74 year old female who is a patient of Dr. Ardis Hughs known to him for colonoscopies.  Her last colonoscopy was in June 2018 at which time there were multiple polyps removed as well as the finding of multiple small and large mouth diverticula in the left colon.  Biopsies were performed to rule out microscopic colitis and were negative for such.  She is due for repeat colonoscopy in June of this year.  She also has a history of suspected IBS-D and/or bile salt related diarrhea with possible medication induced diarrhea as well.  She presents here today with the same complaints as when I saw her in June 2018 at the time of her last office visit.  This is has been going on for years.  She is currently use Imodium and says that it does work.  Her antidepressant was switched from Zoloft to Trintellix and she says that actually that has given some improvement.  She reports diffuse abdominal discomfort/bloating even up under her rib cage at times.  She reported that previously a gluten-free diet seemed to help, but celiac labs were negative.  She had tried Questran in the past without improvement in her symptoms from what she can recall.  She does admit that she does worry and stress about most things in life.  She reports multiple bowel movements a day, but often the first 1 starts out with a couple of small hard pieces of stool before to her loose/watery stools.  She also reports that oftentimes in the mornings she will have dry heaves or vomiting of yellow acid-like material.  She is on omeprazole 40 mg once daily.  There has been no weight loss, in fact she has had about a 12 pound weight gain on our scale since last seen here in June 2018.   Past Medical History:  Diagnosis Date  . Adenomatous colon polyp   . Anxiety   . Bilateral cataracts    within 6 mos.  . Depression   . Diverticulosis   .  Fatty liver   . GERD (gastroesophageal reflux disease)   . H. pylori infection   . Hiatal hernia   . Hyperlipidemia   . Ulcer 1988  . Uterine fibroid    Past Surgical History:  Procedure Laterality Date  . APPENDECTOMY    . CATARACT EXTRACTION, BILATERAL    . CHOLECYSTECTOMY    . COLONOSCOPY    . spontaneous vaginal delivery     x2  . TONSILLECTOMY    . WISDOM TOOTH EXTRACTION      reports that she has been smoking cigarettes. She has a 26.50 pack-year smoking history. She has never used smokeless tobacco. She reports current alcohol use of about 10.0 standard drinks of alcohol per week. She reports that she does not use drugs. family history includes Alzheimer's disease in her mother; Breast cancer in her sister; Depression in her father; Diabetes in her father. Allergies  Allergen Reactions  . Codeine     REACTION: hallucinations      Outpatient Encounter Medications as of 08/23/2019  Medication Sig  . atorvastatin (LIPITOR) 20 MG tablet Take 1 tablet (20 mg total) by mouth daily.  . benzonatate (TESSALON) 100 MG capsule Take 1-2 capsules (100-200 mg total) by mouth 3 (three) times daily as needed for cough.  . fluticasone (FLONASE) 50 MCG/ACT nasal spray Place 2 sprays into both nostrils daily. (  Patient taking differently: Place 2 sprays into both nostrils daily as needed. )  . Investigational vitamin D 600 UNITS capsule SWOG S0812 Take 600 Units by mouth daily. Take with food.  . Loperamide HCl (IMODIUM PO) Take 2 tablets by mouth daily as needed.   Marland Kitchen LORazepam (ATIVAN) 0.5 MG tablet Take 0.5 mg by mouth 2 (two) times daily. Take 1 tablet in the morning and 2 tablets in the evening  . omeprazole (PRILOSEC) 40 MG capsule Take 1 capsule (40 mg total) by mouth daily.  . Tiotropium Bromide Monohydrate (SPIRIVA RESPIMAT) 2.5 MCG/ACT AERS Inhale 2 puffs into the lungs daily. (Patient taking differently: Inhale 2 puffs into the lungs daily as needed. )  . VENTOLIN HFA 108 (90 Base)  MCG/ACT inhaler INHALE 1 PUFF AS NEEDED  . vortioxetine HBr (TRINTELLIX) 10 MG TABS tablet Take 20 mg by mouth in the morning.   . [DISCONTINUED] sertraline (ZOLOFT) 50 MG tablet Take 50 mg by mouth daily.    No facility-administered encounter medications on file as of 08/23/2019.     REVIEW OF SYSTEMS  : All other systems reviewed and negative except where noted in the History of Present Illness.   PHYSICAL EXAM: BP 130/80   Pulse 71   Temp 98.2 F (36.8 C)   Ht 5\' 3"  (1.6 m)   Wt 196 lb (88.9 kg)   SpO2 96%   BMI 34.72 kg/m  General: Well developed white female in no acute distress Head: Normocephalic and atraumatic Eyes:  Sclerae anicteric, conjunctiva pink. Ears: Normal auditory acuity Lungs: Clear throughout to auscultation; no increased WOB. Heart: Regular rate and rhythm; no M/R/G. Abdomen: Soft, non-distended.  BS present.  Non-tender. Musculoskeletal: Symmetrical with no gross deformities  Skin: No lesions on visible extremities Extremities: No edema  Neurological: Alert oriented x 4, grossly non-focal Psychological:  Alert and cooperative. Normal mood and affect  ASSESSMENT AND PLAN: *Chronic loose stools: Story is exact same as it was almost 3 years ago.  Has improved somewhat since we changed her antidepressant from Zoloft to Trintellix, which can also have GI side effects, however.  Likely also component of bile salt related diarrhea and IBS.  Continue Imodium as needed.  Could also consider adding a daily powder fiber supplement such as Benefiber to help create some bulk. *GERD: Reports intermittent dry heaves and vomiting of acid type material in the mornings.  Question if this is due to some nocturnal reflux.  Will increase omeprazole to 40 mg twice daily for 4 to 6 weeks to see if it helps.  New prescription for twice daily dosing sent. *Personal history of adenomatous colon polyps: Due for colonoscopy in June.   CC:  Glendale Chard, MD

## 2019-08-24 NOTE — Progress Notes (Signed)
I agree with the above note, plan 

## 2019-09-29 DIAGNOSIS — F331 Major depressive disorder, recurrent, moderate: Secondary | ICD-10-CM | POA: Diagnosis not present

## 2019-10-24 ENCOUNTER — Other Ambulatory Visit: Payer: Self-pay | Admitting: Internal Medicine

## 2019-11-29 ENCOUNTER — Other Ambulatory Visit: Payer: Self-pay | Admitting: Gastroenterology

## 2019-12-28 ENCOUNTER — Telehealth: Payer: Self-pay | Admitting: Gastroenterology

## 2019-12-29 ENCOUNTER — Other Ambulatory Visit: Payer: Self-pay | Admitting: Gastroenterology

## 2019-12-30 DIAGNOSIS — Z1231 Encounter for screening mammogram for malignant neoplasm of breast: Secondary | ICD-10-CM | POA: Diagnosis not present

## 2019-12-30 LAB — HM MAMMOGRAPHY

## 2020-01-02 ENCOUNTER — Encounter: Payer: Self-pay | Admitting: Nurse Practitioner

## 2020-01-06 ENCOUNTER — Telehealth: Payer: Self-pay | Admitting: Gastroenterology

## 2020-01-06 NOTE — Telephone Encounter (Signed)
Pt wants to know if she qualifies for cologuard instead of having colon.

## 2020-01-06 NOTE — Telephone Encounter (Signed)
The pt has been advised that cologuard is not appropriate for her due to her past history of polyps.  The pt has been advised of the information and verbalized understanding.

## 2020-01-11 ENCOUNTER — Ambulatory Visit: Payer: Medicare Other | Admitting: Family

## 2020-01-11 ENCOUNTER — Encounter: Payer: Self-pay | Admitting: Family

## 2020-01-11 ENCOUNTER — Ambulatory Visit: Payer: Self-pay

## 2020-01-11 VITALS — Ht 63.0 in | Wt 196.0 lb

## 2020-01-11 DIAGNOSIS — S322XXA Fracture of coccyx, initial encounter for closed fracture: Secondary | ICD-10-CM | POA: Diagnosis not present

## 2020-01-11 DIAGNOSIS — M533 Sacrococcygeal disorders, not elsewhere classified: Secondary | ICD-10-CM | POA: Diagnosis not present

## 2020-01-11 NOTE — Progress Notes (Signed)
Office Visit Note   Patient: Tara Davis           Date of Birth: 02-Jan-1946           MRN: [id number] Visit Date: 01/11/2020              Requested by: Glendale Chard, [address] [place],  [place] [address] PCP: Glendale Chard, MD  Chief Complaint  Patient presents with  . Lower Back - Pain    S/p fall DOI 01/05/20      HPI: The patient is a 74 year old woman who presents today for initial evaluation of tailbone pain.  Unfortunately she had a fall where lost her footing of her right foot slipped out from under her and fell on her stairs.  Landed directly on her backside. she has not been having any radicular symptoms primarily having pain with sitting some with standing.  She states she has been able to get comfortable full in bed and is sleeping okay.  Initial injury was July 29.  She has been taking 800 mg of ibuprofen with moderate relief  No red flag symptoms.  Assessment & Plan: Visit Diagnoses:  1. Closed fracture of coccyx, initial encounter ([place])   2. Sacral back pain     Plan: Discussed probable coccyx fracture.  Discussed supportive measures.  Offered narcotic pain relief.  Patient declined.  Should like to proceed with conservative measures continue offloading the tailbone with sitting using anti-inflammatories and Tylenol for pain.  She will follow-up in the office in 2 to 3 weeks if things fail to improve.  Follow-Up Instructions: Return in about 3 weeks (around 02/01/2020), or if symptoms worsen or fail to improve.   Back Exam   Range of Motion  The patient has normal back ROM.  Other  Gait: normal       Patient is alert, oriented, no adenopathy, well-dressed, normal affect, normal respiratory effort. Point tender to her coccyx.  Imaging: No results found. No images are attached to the encounter.  Labs: Lab Results  Component Value Date   HGBA1C 5.9 (H) 04/13/2018   LABORGA Multiple bacterial morphotypes present, none  12/17/2014   LABORGA predominant. Suggest appropriate recollection if  12/17/2014   LABORGA clinically indicated. 12/17/2014     Lab Results  Component Value Date   ALBUMIN 4.5 12/07/2018   ALBUMIN 4.6 04/13/2018   ALBUMIN 4.4 12/02/2016    Lab Results  Component Value Date   MG 2.1 01/07/2008   Lab Results  Component Value Date   VD25OH 31.2 04/13/2018    No results found for: PREALBUMIN CBC EXTENDED Latest Ref Rng & Units 12/07/2018 04/13/2018 08/22/2015  WBC 3.4 - 10.8 x10E3/uL 9.7 8.7 9.2  RBC 3.77 - 5.28 x10E6/uL 4.34 4.38 4.54  HGB 11.1 - 15.9 g/dL 14.5 14.6 15.1(H)  HCT 34.0 - 46.6 % 41.9 42.2 44.1  PLT 150 - 450 x10E3/uL 299 289 323.0  NEUTROABS 1 - 7 x10E3/uL 6.8 - 5.5  LYMPHSABS 0 - 3 x10E3/uL 2.1 - 3.1     Body mass index is 34.72 kg/m.  Orders:  Orders Placed This Encounter  Procedures  . XR Sacrum/Coccyx   No orders of the defined types were placed in this encounter.    Procedures: No procedures performed  Clinical Data: No additional findings.  ROS:  All other systems negative, except as noted in the HPI. Review of Systems  Objective: Vital Signs: Ht 5\' 3"  (1.6 m)   Wt  196 lb (88.9 kg)   BMI 34.72 kg/m   Specialty Comments:  No specialty comments available.  PMFS History: Patient Active Problem List   Diagnosis Date Noted  . Gastroesophageal reflux disease 08/23/2019  . Tobacco abuse 01/12/2018  . Cigarette smoker 01/12/2018  . Pelvic pain 12/02/2016  . NAUSEA WITH VOMITING 04/12/2010  . IBS 07/05/2008   Past Medical History:  Diagnosis Date  . Adenomatous colon polyp   . Anxiety   . Bilateral cataracts    within 6 mos.  . Depression   . Diverticulosis   . Fatty liver   . GERD (gastroesophageal reflux disease)   . H. pylori infection   . Hiatal hernia   . Hyperlipidemia   . Ulcer 1988  . Uterine fibroid     Family History  Problem Relation Age of Onset  . Alzheimer's disease Mother   . Depression Father   .  Diabetes Father   . Breast cancer Sister   . Colon cancer Neg Hx   . Esophageal cancer Neg Hx   . Stomach cancer Neg Hx     Past Surgical History:  Procedure Laterality Date  . APPENDECTOMY    . CATARACT EXTRACTION, BILATERAL    . CHOLECYSTECTOMY    . COLONOSCOPY    . spontaneous vaginal delivery     x2  . TONSILLECTOMY    . WISDOM TOOTH EXTRACTION     Social History   Occupational History  . 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 Smoking status: Current Every Day Smoker    Packs/day: 0.50    Years: 53.00    Pack years: 26.50    Types: Cigarettes  . Smokeless tobacco: Never Used  Vaping Use  . Vaping Use: Never used  Substance and Sexual Activity  . Alcohol use: Yes    Alcohol/week: 10.0 standard drinks    Types: 10 Shots of liquor per week  . Drug use: No  . Sexual activity: Not Currently

## 2020-01-12 ENCOUNTER — Telehealth: Payer: Self-pay | Admitting: Family

## 2020-01-12 DIAGNOSIS — T148XXA Other injury of unspecified body region, initial encounter: Secondary | ICD-10-CM

## 2020-01-12 DIAGNOSIS — F419 Anxiety disorder, unspecified: Secondary | ICD-10-CM | POA: Diagnosis not present

## 2020-01-12 DIAGNOSIS — F331 Major depressive disorder, recurrent, moderate: Secondary | ICD-10-CM | POA: Diagnosis not present

## 2020-01-12 DIAGNOSIS — M810 Age-related osteoporosis without current pathological fracture: Secondary | ICD-10-CM

## 2020-01-12 NOTE — Telephone Encounter (Signed)
Patient called asked if Junie Panning would prescribe something for pain for her. Patient asked if Junie Panning thought Tramadol would help her. Also patient said the Lidocaine pack did not work. Patient asked if Junie Panning thought that Voltaren Gel would help her? Patient asked if she should get a bone density test? The number to contact patient is [phone]

## 2020-01-12 NOTE — Telephone Encounter (Signed)
Pt was in the office yesterday for possible coccyx fracture has questions about medication and possible bone density please advise.

## 2020-01-16 ENCOUNTER — Telehealth: Payer: Self-pay | Admitting: Family

## 2020-01-16 ENCOUNTER — Other Ambulatory Visit: Payer: Self-pay | Admitting: Physician Assistant

## 2020-01-16 MED ORDER — TRAMADOL HCL 50 MG PO TABS: 50.0000 mg | ORAL_TABLET | Freq: Four times a day (QID) | ORAL | 0 refills | Status: DC | PRN

## 2020-01-16 NOTE — Telephone Encounter (Signed)
Rx sent 

## 2020-01-16 NOTE — Telephone Encounter (Signed)
Pt called stating she still hasn't heard from [place] and would like a CB today if possible.  [phone]

## 2020-01-16 NOTE — Telephone Encounter (Signed)
Please advise, patient was called and would like to know if she can be prescribed Tramadol. She would like to take something to ease her pain without it irritating her GI Tract. Patient has IBS. Patient would like for Rx to be sent to pharmacy

## 2020-01-16 NOTE — Telephone Encounter (Signed)
Please advise, patient was called and would like to know if she can be prescribed Tramadol. She would like to take something to ease her pain without it irritating her GI Tract. Patient has IBS. Patient would like for Rx to be sent to pharmacy.

## 2020-01-25 DIAGNOSIS — F419 Anxiety disorder, unspecified: Secondary | ICD-10-CM | POA: Diagnosis not present

## 2020-01-25 DIAGNOSIS — F331 Major depressive disorder, recurrent, moderate: Secondary | ICD-10-CM | POA: Diagnosis not present

## 2020-01-26 ENCOUNTER — Telehealth: Payer: Self-pay

## 2020-01-26 ENCOUNTER — Telehealth: Payer: Self-pay | Admitting: Family

## 2020-01-26 NOTE — Telephone Encounter (Signed)
Did you talk to this pt and advise  that we would set up the referral for bone density or for this to be arrange through PCP?

## 2020-01-26 NOTE — Telephone Encounter (Signed)
Patient called. She would like to know when she will have the bone density test referral sent. Her call back number is [phone]

## 2020-01-26 NOTE — Telephone Encounter (Signed)
The pt called and left a message that she needed a bone density because she fell and broke her coccyx and that she saw Junie Panning, [place] for Dr. Sharol Given sent a referral to [facility] and that [facility] said that they couldn't fine the pt in the system so she needed the name of where she went.  I talked to the office manager because the pt said she's left several messages with referrals with no call back.  The office manager Misti said that she called the pt personally and that she also called and the [facility] said the pt was seen August 4th, I seen in the pt's chart that she has been seen at [facility] before she had a mammogram July of this year so she needed to go to have Dr. Sharol Given send a referral to [facility]  I think the pt meant to say [facility] and they didn't have her in the system.

## 2020-01-26 NOTE — Telephone Encounter (Signed)
I had the referral specialist to explain the process to the pt.  The patient was told to call back for an appt if the ortho can't replace the referral to [facility] for her to have her bone density scan.

## 2020-01-27 ENCOUNTER — Other Ambulatory Visit: Payer: Self-pay | Admitting: Family

## 2020-01-27 DIAGNOSIS — M810 Age-related osteoporosis without current pathological fracture: Secondary | ICD-10-CM

## 2020-01-27 NOTE — Telephone Encounter (Signed)
I called and lm on vm to advise of message below to call with any questions.

## 2020-01-30 ENCOUNTER — Other Ambulatory Visit: Payer: Self-pay | Admitting: Gastroenterology

## 2020-01-30 ENCOUNTER — Other Ambulatory Visit: Payer: Self-pay | Admitting: Internal Medicine

## 2020-01-30 DIAGNOSIS — Z780 Asymptomatic menopausal state: Secondary | ICD-10-CM | POA: Diagnosis not present

## 2020-01-30 LAB — HM DEXA SCAN: HM Dexa Scan: NORMAL

## 2020-01-31 ENCOUNTER — Encounter: Payer: Self-pay | Admitting: Internal Medicine

## 2020-02-01 ENCOUNTER — Encounter: Payer: Self-pay | Admitting: Internal Medicine

## 2020-02-01 ENCOUNTER — Telehealth: Payer: Self-pay | Admitting: Family

## 2020-02-01 NOTE — Telephone Encounter (Signed)
Bone density results.

## 2020-02-01 NOTE — Telephone Encounter (Signed)
Patient asking for PA Erin to call with bone density results. Please call patient at [phone].

## 2020-02-21 ENCOUNTER — Other Ambulatory Visit: Payer: Self-pay | Admitting: Gastroenterology

## 2020-03-01 DIAGNOSIS — F331 Major depressive disorder, recurrent, moderate: Secondary | ICD-10-CM | POA: Diagnosis not present

## 2020-03-02 ENCOUNTER — Ambulatory Visit: Payer: Medicare Other | Admitting: Gastroenterology

## 2020-03-02 ENCOUNTER — Encounter: Payer: Self-pay | Admitting: Gastroenterology

## 2020-03-02 DIAGNOSIS — Z86010 Personal history of colonic polyps: Secondary | ICD-10-CM | POA: Diagnosis not present

## 2020-03-02 DIAGNOSIS — R195 Other fecal abnormalities: Secondary | ICD-10-CM

## 2020-03-02 MED ORDER — CHOLESTYRAMINE 4 G PO PACK: PACK | ORAL | 6 refills | Status: DC

## 2020-03-02 MED ORDER — SUPREP BOWEL PREP KIT 17.5-3.13-1.6 GM/177ML PO SOLN: 1.0000 | ORAL | 0 refills | Status: DC

## 2020-03-02 NOTE — Progress Notes (Signed)
Review of pertinent gastrointestinal problems: 1. Several adenomatous polyps 10/2009 colonoscopy (also diverticulosis,  hemorhoids), largest (1.72mm) piecemeal resected. recall at 6 months. repeat colonoscopy 04/2010 found 3 small adenomas; colonoscopy June 2018 four subcentimeter polyps were found and removed.  Also left-sided diverticulosis.  The colon was biopsied randomly for microscopic colitis given her chronic loose stools.  Pathology showed these were adenomatous and sessile serrated polyps.  The random biopsies of her colon were all normal  HPI: This is a very pleasant 74 year old woman whom I last saw at the time of a colonoscopy a little over 4 years ago.  Her son is a Development worker, international aid out of [place].  It sounds like he is a [employer] during college.  He is in his late 66s.  She had some great stories about him when they moved to [place] when he turned around 6 and had half his class as [employer] speakers.  She was last here in our office 6 months ago and she saw Janett Billow for chronic loose stools. At that time her history was exactly the same as it was 3 years prior. Her symptoms have improved somewhat since changing her from Zoloft to a different antidepressant. She was recommended to continue Imodium on as-needed basis and consider bulking her stool with daily fiber supplements. She was having some dry heaves and acid type symptoms in the morning and Jessica increase her omeprazole to 40 mg twice daily.  She tells me she really has not had much relief from her chronic intermittent diarrhea since it started shortly after her gallbladder was removed about 30 years ago.  She takes Imodium on a as needed basis.  She will have urgent diarrhea at times nocturnal periodically.  She drinks 2 vodka drinks daily she is tells me that each 1 has about 1 shot of vodka and that.  She tells me not eating garlic has helped these symptoms.  Her weight is overall stable.  She has  had no overt GI bleeding and colon cancer does not run in her family  ROS: complete GI ROS as described in HPI, all other review negative.  Constitutional:  No unintentional weight loss   Past Medical History:  Diagnosis Date  . Adenomatous colon polyp   . Anxiety   . Bilateral cataracts    within 6 mos.  . Depression   . Diverticulosis   . Fatty liver   . GERD (gastroesophageal reflux disease)   . H. pylori infection   . Hiatal hernia   . Hyperlipidemia   . Ulcer 1988  . Uterine fibroid     Past Surgical History:  Procedure Laterality Date  . APPENDECTOMY    . CATARACT EXTRACTION, BILATERAL    . CHOLECYSTECTOMY    . COLONOSCOPY    . spontaneous vaginal delivery     x2  . TONSILLECTOMY    . WISDOM TOOTH EXTRACTION      Current Outpatient Medications  Medication Sig Dispense Refill  . atorvastatin (LIPITOR) 20 MG tablet TAKE ONE TABLET BY MOUTH DAILY 90 tablet 1  . benzonatate (TESSALON) 100 MG capsule Take 1-2 capsules (100-200 mg total) by mouth 3 (three) times daily as needed for cough. 40 capsule 0  . Investigational vitamin D 600 UNITS capsule SWOG S0812 Take 600 Units by mouth daily. Take with food.    . Loperamide HCl (IMODIUM PO) Take 2 tablets by mouth daily as needed.     Marland Kitchen LORazepam (ATIVAN) 0.5 MG tablet Take  1 mg by mouth 2 (two) times daily.     Marland Kitchen omeprazole (PRILOSEC) 20 MG capsule Take 20 mg by mouth 2 (two) times daily before a meal.    . Tiotropium Bromide Monohydrate (SPIRIVA RESPIMAT) 2.5 MCG/ACT AERS Inhale 2 puffs into the lungs daily. (Patient taking differently: Inhale 2 puffs into the lungs daily as needed. ) 1 Inhaler 3  . VENTOLIN HFA 108 (90 Base) MCG/ACT inhaler INHALE 1 PUFF AS NEEDED  2  . vortioxetine HBr (TRINTELLIX) 20 MG TABS tablet Take 20 mg by mouth daily.     No current facility-administered medications for this visit.    Allergies as of 03/02/2020 - Review Complete 03/02/2020  Allergen Reaction Noted  . Codeine  10/29/2009     Family History  Problem Relation Age of Onset  . Alzheimer's disease Mother   . Depression Father   . Diabetes Father   . Breast cancer Sister   . Colon cancer Neg Hx   . Esophageal cancer Neg Hx   . Stomach cancer Neg Hx     Social History   Socioeconomic History  . Marital status: Single    Spouse name: Not on file  . Number of children: 2  . Years of education: 16  . Highest education level: Bachelor's degree (e.g., BA, AB, BS)  Occupational History  . 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 Smoking status: Current Every Day Smoker    Packs/day: 0.50    Years: 53.00    Pack years: 26.50    Types: Cigarettes  . Smokeless tobacco: Never Used  Vaping Use  . Vaping Use: Never used  Substance and Sexual Activity  . Alcohol use: Yes    Alcohol/week: 10.0 standard drinks    Types: 10 Shots of liquor per week  . Drug use: No  . Sexual activity: Not Currently  Other Topics Concern  . Not on file  Social History Narrative   Patient is right-handed. She lives with her long time boyfriend of 25+ years ina 2 level home. She drinks green tea, 2-4 cups a day. She does not exercise.   Social Determinants of Health   Financial Resource Strain: Low Risk   . Difficulty of Paying Living Expenses: Not hard at all  Food Insecurity: No Food Insecurity  . Worried About Charity fundraiser in the Last Year: Never true  . Ran Out of Food in the Last Year: Never true  Transportation Needs: No Transportation Needs  . Lack of Transportation (Medical): No  . Lack of Transportation (Non-Medical): No  Physical Activity: Insufficiently Active  . Days of Exercise per Week: 7 days  . Minutes of Exercise per Session: 20 min  Stress: No Stress Concern Present  . Feeling of Stress : Not at all  Social Connections:   . Frequency of Communication with Friends and Family: Not on file  . Frequency of Social Gatherings with Friends and Family: Not on file  . Attends  Religious Services: Not on file  . Active Member of Clubs or Organizations: Not on file  . Attends Archivist Meetings: Not on file  . Marital Status: Not on file  Intimate Partner Violence:   . Fear of Current or Ex-Partner: Not on file  . Emotionally Abused: Not on file  . Physically Abused: Not on file  . Sexually Abused: Not on file     Physical Exam: BP 120/70 (BP Location: Left Arm, Patient Position: Sitting, Cuff Size:  Normal)   Pulse 80   Ht 5' 2.5" (1.588 m) Comment: height measured without shoes  Wt 194 lb 6 oz (88.2 kg)   BMI 34.99 kg/m  Constitutional: generally well-appearing Psychiatric: alert and oriented x3 Abdomen: soft, nontender, nondistended, no obvious ascites, no peritoneal signs, normal bowel sounds No peripheral edema noted in lower extremities  Assessment and plan: 74 y.o. female with chronic intermittent loose stools, personal history of precancerous colon polyps  She had Four precancerous colon polyps removed from her colon about 3 years ago and is so "due" for repeat colonoscopy for surveillance around now.  She is quite bothered by chronic intermittent loose stools since her gallbladder was removed many years ago.  I recommended a trial of cholestyramine powder 4 g on food once daily.  We are calling her in a new prescription for this and I will be interested to see how she does on this medicine when she comes back in for her colonoscopy.  Please see the "Patient Instructions" section for addition details about the plan.  Owens Loffler, MD [facility] 03/02/2020, 2:52 PM   Total time on date of encounter was 30 minutes (this included time spent preparing to see the patient reviewing records; obtaining and/or reviewing separately obtained history; performing a medically appropriate exam and/or evaluation; counseling and educating the patient and family if present; ordering medications, tests or procedures if applicable; and  documenting clinical information in the health record).

## 2020-03-02 NOTE — Patient Instructions (Addendum)
If you are age 73 or older, your body mass index should be between 23-30. Your Body mass index is 34.99 kg/m. If this is out of the aforementioned range listed, please consider follow up with your Primary Care Provider.  If you are age 56 or younger, your body mass index should be between 19-25. Your Body mass index is 34.99 kg/m. If this is out of the aformentioned range listed, please consider follow up with your Primary Care Provider.   You have been scheduled for a colonoscopy. Please follow written instructions given to you at your visit today.  Please pick up your prep supplies at the pharmacy within the next 1-3 days. If you use inhalers (even only as needed), please bring them with you on the day of your procedure.  We have sent the following medications to your pharmacy for you to pick up at your convenience:  START: cholestyramine 4g packet once daily with food.  Due to recent changes in healthcare laws, you may see the results of your imaging and laboratory studies on MyChart before your provider has had a chance to review them.  We understand that in some cases there may be results that are confusing or concerning to you. Not all laboratory results come back in the same time frame and the provider may be waiting for multiple results in order to interpret others.  Please give [place] 48 hours in order for your provider to thoroughly review all the results before contacting the office for clarification of your results.   Thank you for entrusting me with your care and choosing [facility].  Dr Ardis Hughs

## 2020-03-07 DIAGNOSIS — F331 Major depressive disorder, recurrent, moderate: Secondary | ICD-10-CM | POA: Diagnosis not present

## 2020-03-07 DIAGNOSIS — F419 Anxiety disorder, unspecified: Secondary | ICD-10-CM | POA: Diagnosis not present

## 2020-03-28 DIAGNOSIS — F419 Anxiety disorder, unspecified: Secondary | ICD-10-CM | POA: Diagnosis not present

## 2020-03-28 DIAGNOSIS — F331 Major depressive disorder, recurrent, moderate: Secondary | ICD-10-CM | POA: Diagnosis not present

## 2020-03-29 ENCOUNTER — Telehealth: Payer: Self-pay | Admitting: Gastroenterology

## 2020-03-29 NOTE — Telephone Encounter (Signed)
Hi Dr. Ardis Hughs, this pt just rescheduled her procedure that was scheduled with you on 04/04/20. She is r/s to 11/2 at 10:30am. Thank you.

## 2020-04-04 ENCOUNTER — Encounter: Payer: Medicare Other | Admitting: Gastroenterology

## 2020-04-10 ENCOUNTER — Encounter: Payer: Medicare Other | Admitting: Gastroenterology

## 2020-04-17 ENCOUNTER — Telehealth: Payer: Self-pay

## 2020-04-17 NOTE — Telephone Encounter (Signed)
I called the patient to reschedule her AWV.  She said that she's been having some digestive issues and would like to be seen this week or next week if possible.  She asked if Auburn Bilberry could call her for more details.  I told her that I would send the message.

## 2020-04-18 ENCOUNTER — Other Ambulatory Visit: Payer: Self-pay | Admitting: Internal Medicine

## 2020-04-18 ENCOUNTER — Telehealth: Payer: Self-pay

## 2020-04-18 MED ORDER — ONDANSETRON HCL 4 MG PO TABS: 4.0000 mg | ORAL_TABLET | Freq: Three times a day (TID) | ORAL | 0 refills | Status: DC | PRN

## 2020-04-18 NOTE — Telephone Encounter (Signed)
The pt was notified that she wasn't getting told to go to the GI because no one wanted to see her it's that the the pt has been seeing the GI for her symptoms and that she was advised to go the er because she is in pain and vomiting bile.  The pt said she didn't have anything for nausea when asked.  The pt was told that Dr. Baird Cancer is sending in a medication for the nausea and for the pt to contact her GI or go the er.

## 2020-05-09 ENCOUNTER — Other Ambulatory Visit: Payer: Self-pay | Admitting: Internal Medicine

## 2020-05-10 ENCOUNTER — Telehealth: Payer: Self-pay

## 2020-05-10 ENCOUNTER — Other Ambulatory Visit: Payer: Self-pay

## 2020-05-10 MED ORDER — ONDANSETRON HCL 4 MG PO TABS: 4.0000 mg | ORAL_TABLET | Freq: Three times a day (TID) | ORAL | 0 refills | Status: DC | PRN

## 2020-05-10 NOTE — Telephone Encounter (Signed)
The pt was notified that Dr Baird Cancer sent a refill to the pharmacy for her ondansetron because the pt said she can 't see the GI doctor until the 10th.

## 2020-05-15 DIAGNOSIS — F332 Major depressive disorder, recurrent severe without psychotic features: Secondary | ICD-10-CM | POA: Diagnosis not present

## 2020-05-16 DIAGNOSIS — Z20822 Contact with and (suspected) exposure to covid-19: Secondary | ICD-10-CM | POA: Diagnosis not present

## 2020-05-18 ENCOUNTER — Other Ambulatory Visit: Payer: Self-pay | Admitting: Gastroenterology

## 2020-05-18 DIAGNOSIS — R1030 Lower abdominal pain, unspecified: Secondary | ICD-10-CM

## 2020-05-18 DIAGNOSIS — Z86010 Personal history of colonic polyps: Secondary | ICD-10-CM | POA: Diagnosis not present

## 2020-05-18 DIAGNOSIS — R109 Unspecified abdominal pain: Secondary | ICD-10-CM | POA: Diagnosis not present

## 2020-05-18 DIAGNOSIS — R197 Diarrhea, unspecified: Secondary | ICD-10-CM

## 2020-05-21 DIAGNOSIS — R197 Diarrhea, unspecified: Secondary | ICD-10-CM | POA: Diagnosis not present

## 2020-05-23 ENCOUNTER — Ambulatory Visit
Admission: RE | Admit: 2020-05-23 | Discharge: 2020-05-23 | Disposition: A | Discharge status: Home / Self Care | Payer: Medicare Other | Source: Ambulatory Visit | Attending: Gastroenterology | Admitting: Gastroenterology

## 2020-05-23 ENCOUNTER — Other Ambulatory Visit: Payer: Self-pay

## 2020-05-23 DIAGNOSIS — K5790 Diverticulosis of intestine, part unspecified, without perforation or abscess without bleeding: Secondary | ICD-10-CM | POA: Diagnosis not present

## 2020-05-23 DIAGNOSIS — K7689 Other specified diseases of liver: Secondary | ICD-10-CM | POA: Diagnosis not present

## 2020-05-23 DIAGNOSIS — R1030 Lower abdominal pain, unspecified: Secondary | ICD-10-CM

## 2020-05-23 DIAGNOSIS — R197 Diarrhea, unspecified: Secondary | ICD-10-CM | POA: Diagnosis not present

## 2020-05-23 DIAGNOSIS — K6389 Other specified diseases of intestine: Secondary | ICD-10-CM | POA: Diagnosis not present

## 2020-05-23 MED ORDER — IOPAMIDOL (ISOVUE-300) INJECTION 61%
100.0000 mL | Freq: Once | INTRAVENOUS | Status: AC | PRN
  Administered 2020-05-23: 100 mL via INTRAVENOUS

## 2020-05-28 ENCOUNTER — Telehealth: Payer: Self-pay

## 2020-05-28 NOTE — Telephone Encounter (Signed)
Returned call to patient about her ct that she had on 05/23/20 that was ordered by Dr Paulita Fujita. Explained to patient that we are not in the same system as dr outlaw so she should reach out to dr outlaw again today. Patient shouted "I called your office for help and clearly you cant help me and hung up" patient is very upset that she has not heard from Dr Paulita Fujita.   I Spoke with PCP and was advised to give results to patient and advise her to follow up with GI.   Called patient to give her results. After results given patient became upset that Dr Baird Cancer gave her results but is sending her back to GI. I explained to patient that they specialize in stomach issues. Patient began to scream about how no one is trying to help her and hung up again.   I have reached out to Dr Lavetta Nielsen office. Message left for nurse to return my call.

## 2020-05-29 DIAGNOSIS — F332 Major depressive disorder, recurrent severe without psychotic features: Secondary | ICD-10-CM | POA: Diagnosis not present

## 2020-05-30 ENCOUNTER — Other Ambulatory Visit: Payer: Self-pay | Admitting: Internal Medicine

## 2020-05-31 ENCOUNTER — Ambulatory Visit: Payer: Medicare Other

## 2020-06-06 ENCOUNTER — Other Ambulatory Visit: Payer: Medicare Other

## 2020-06-06 DIAGNOSIS — Z0100 Encounter for examination of eyes and vision without abnormal findings: Secondary | ICD-10-CM | POA: Diagnosis not present

## 2020-06-14 ENCOUNTER — Ambulatory Visit (INDEPENDENT_AMBULATORY_CARE_PROVIDER_SITE_OTHER): Payer: Medicare Other

## 2020-06-14 ENCOUNTER — Other Ambulatory Visit: Payer: Self-pay

## 2020-06-14 VITALS — BP 130/96 | HR 98 | Temp 98.3°F | Ht 61.8 in | Wt 186.2 lb

## 2020-06-14 DIAGNOSIS — Z0000 Encounter for general adult medical examination without abnormal findings: Secondary | ICD-10-CM

## 2020-06-14 NOTE — Patient Instructions (Signed)
Tara Davis , Thank you for taking time to come for your Medicare Wellness Visit. I appreciate your ongoing commitment to your health goals. Please review the following plan we discussed and let me know if I can assist you in the future.   Screening recommendations/referrals: Colonoscopy: completed 11/07/2016, due now Mammogram: completed 12/30/2019, due 12/29/2020 Bone Density: completed 01/30/2020 Recommended yearly ophthalmology/optometry visit for glaucoma screening and checkup Recommended yearly dental visit for hygiene and checkup  Vaccinations: Influenza vaccine: completed 03/12/2020, due 01/07/2021 Pneumococcal vaccine: completed 02/02/2017 Tdap vaccine: completed 03/10/2011, due 03/09/2021 Shingles vaccine: completed   Covid-19: 07/10/2019, 08/07/2019, 03/31/2020  Advanced directives: Please bring a copy of your POA (Power of Attorney) and/or Living Will to your next appointment.   Conditions/risks identified: smoking  Next appointment: Follow up in one year for your annual wellness visit    Preventive Care 65 Years and Older, Female Preventive care refers to lifestyle choices and visits with your health care provider that can promote health and wellness. What does preventive care include?  A yearly physical exam. This is also called an annual well check.  Dental exams once or twice a year.  Routine eye exams. Ask your health care provider how often you should have your eyes checked.  Personal lifestyle choices, including:  Daily care of your teeth and gums.  Regular physical activity.  Eating a healthy diet.  Avoiding tobacco and drug use.  Limiting alcohol use.  Practicing safe sex.  Taking low-dose aspirin every day.  Taking vitamin and mineral supplements as recommended by your health care provider. What happens during an annual well check? The services and screenings done by your health care provider during your annual well check will depend on your age, overall  health, lifestyle risk factors, and family history of disease. Counseling  Your health care provider may ask you questions about your:  Alcohol use.  Tobacco use.  Drug use.  Emotional well-being.  Home and relationship well-being.  Sexual activity.  Eating habits.  History of falls.  Memory and ability to understand (cognition).  Work and work Astronomer.  Reproductive health. Screening  You may have the following tests or measurements:  Height, weight, and BMI.  Blood pressure.  Lipid and cholesterol levels. These may be checked every 5 years, or more frequently if you are over 53 years old.  Skin check.  Lung cancer screening. You may have this screening every year starting at age 62 if you have a 30-pack-year history of smoking and currently smoke or have quit within the past 15 years.  Fecal occult blood test (FOBT) of the stool. You may have this test every year starting at age 109.  Flexible sigmoidoscopy or colonoscopy. You may have a sigmoidoscopy every 5 years or a colonoscopy every 10 years starting at age 74.  Hepatitis C blood test.  Hepatitis B blood test.  Sexually transmitted disease (STD) testing.  Diabetes screening. This is done by checking your blood sugar (glucose) after you have not eaten for a while (fasting). You may have this done every 1-3 years.  Bone density scan. This is done to screen for osteoporosis. You may have this done starting at age 96.  Mammogram. This may be done every 1-2 years. Talk to your health care provider about how often you should have regular mammograms. Talk with your health care provider about your test results, treatment options, and if necessary, the need for more tests. Vaccines  Your health care provider may recommend certain vaccines,  such as:  Influenza vaccine. This is recommended every year.  Tetanus, diphtheria, and acellular pertussis (Tdap, Td) vaccine. You may need a Td booster every 10  years.  Zoster vaccine. You may need this after age 81.  Pneumococcal 13-valent conjugate (PCV13) vaccine. One dose is recommended after age 22.  Pneumococcal polysaccharide (PPSV23) vaccine. One dose is recommended after age 25. Talk to your health care provider about which screenings and vaccines you need and how often you need them. This information is not intended to replace advice given to you by your health care provider. Make sure you discuss any questions you have with your health care provider. Document Released: 06/22/2015 Document Revised: 02/13/2016 Document Reviewed: 03/27/2015 Elsevier Interactive Patient Education  2017 [place] Prevention in the Home Falls can cause injuries. They can happen to people of all ages. There are many things you can do to make your home safe and to help prevent falls. What can I do on the outside of my home?  Regularly fix the edges of walkways and driveways and fix any cracks.  Remove anything that might make you trip as you walk through a door, such as a raised step or threshold.  Trim any bushes or trees on the path to your home.  Use bright outdoor lighting.  Clear any walking paths of anything that might make someone trip, such as rocks or tools.  Regularly check to see if handrails are loose or broken. Make sure that both sides of any steps have handrails.  Any raised decks and porches should have guardrails on the edges.  Have any leaves, snow, or ice cleared regularly.  Use sand or salt on walking paths during winter.  Clean up any spills in your garage right away. This includes oil or grease spills. What can I do in the bathroom?  Use night lights.  Install grab bars by the toilet and in the tub and shower. Do not use towel bars as grab bars.  Use non-skid mats or decals in the tub or shower.  If you need to sit down in the shower, use a plastic, non-slip stool.  Keep the floor dry. Clean up any water that  spills on the floor as soon as it happens.  Remove soap buildup in the tub or shower regularly.  Attach bath mats securely with double-sided non-slip rug tape.  Do not have throw rugs and other things on the floor that can make you trip. What can I do in the bedroom?  Use night lights.  Make sure that you have a light by your bed that is easy to reach.  Do not use any sheets or blankets that are too big for your bed. They should not hang down onto the floor.  Have a firm chair that has side arms. You can use this for support while you get dressed.  Do not have throw rugs and other things on the floor that can make you trip. What can I do in the kitchen?  Clean up any spills right away.  Avoid walking on wet floors.  Keep items that you use a lot in easy-to-reach places.  If you need to reach something above you, use a strong step stool that has a grab bar.  Keep electrical cords out of the way.  Do not use floor polish or wax that makes floors slippery. If you must use wax, use non-skid floor wax.  Do not have throw rugs and other things on  the floor that can make you trip. What can I do with my stairs?  Do not leave any items on the stairs.  Make sure that there are handrails on both sides of the stairs and use them. Fix handrails that are broken or loose. Make sure that handrails are as long as the stairways.  Check any carpeting to make sure that it is firmly attached to the stairs. Fix any carpet that is loose or worn.  Avoid having throw rugs at the top or bottom of the stairs. If you do have throw rugs, attach them to the floor with carpet tape.  Make sure that you have a light switch at the top of the stairs and the bottom of the stairs. If you do not have them, ask someone to add them for you. What else can I do to help prevent falls?  Wear shoes that:  Do not have high heels.  Have rubber bottoms.  Are comfortable and fit you well.  Are closed at the  toe. Do not wear sandals.  If you use a stepladder:  Make sure that it is fully opened. Do not climb a closed stepladder.  Make sure that both sides of the stepladder are locked into place.  Ask someone to hold it for you, if possible.  Clearly mark and make sure that you can see:  Any grab bars or handrails.  First and last steps.  Where the edge of each step is.  Use tools that help you move around (mobility aids) if they are needed. These include:  Canes.  Walkers.  Scooters.  Crutches.  Turn on the lights when you go into a dark area. Replace any light bulbs as soon as they burn out.  Set up your furniture so you have a clear path. Avoid moving your furniture around.  If any of your floors are uneven, fix them.  If there are any pets around you, be aware of where they are.  Review your medicines with your doctor. Some medicines can make you feel dizzy. This can increase your chance of falling. Ask your doctor what other things that you can do to help prevent falls. This information is not intended to replace advice given to you by your health care provider. Make sure you discuss any questions you have with your health care provider. Document Released: 03/22/2009 Document Revised: 11/01/2015 Document Reviewed: 06/30/2014 Elsevier Interactive Patient Education  2017 [employer].

## 2020-06-14 NOTE — Progress Notes (Signed)
This visit occurred during the SARS-CoV-2 public health emergency.  Safety protocols were in place, including screening questions prior to the visit, additional usage of staff PPE, and extensive cleaning of exam room while observing appropriate contact time as indicated for disinfecting solutions.  Subjective:   Tara Davis is a 75 y.o. female who presents for Medicare Annual (Subsequent) preventive examination.  Review of Systems     Cardiac Risk Factors include: advanced age (>37mn, >>13women);sedentary lifestyle;smoking/ tobacco exposure;obesity (BMI >30kg/m2)     Objective:    Today's Vitals   06/14/20 1150  BP: (!) 130/96  Pulse: 98  Temp: 98.3 F (36.8 C)  TempSrc: Oral  SpO2: 96%  Weight: 186 lb 3.2 oz (84.5 kg)  Height: 5' 1.8" (1.57 m)   Body mass index is 34.28 kg/m.  Advanced Directives 06/14/2020 05/26/2019 01/17/2019 11/07/2016 11/04/2016 07/21/2016  Does Patient Have a Medical Advance Directive? Yes Yes Yes No No No  Type of AParamedicof [place]jes[place]Living will [place] will [place]de ParkLiving will - - -  Does patient want to make changes to medical advance directive? - - No - Patient declined - - -  Copy of [place] Chart? No - copy requested No - copy requested No - copy requested - - -    Current Medications (verified) Outpatient Encounter Medications as of 06/14/2020  Medication Sig  . atorvastatin (LIPITOR) 20 MG tablet TAKE ONE TABLET BY MOUTH DAILY  . benzonatate (TESSALON) 100 MG capsule Take 1-2 capsules (100-200 mg total) by mouth 3 (three) times daily as needed for cough.  . Investigational vitamin D 600 UNITS capsule SWOG [id number] 600 Units by mouth daily. Take with food.  . Loperamide HCl (IMODIUM PO) Take 2 tablets by mouth daily as needed.   .Marland KitchenLORazepam (ATIVAN) 0.5 MG tablet Take 1 mg by mouth 2 (two) times daily.   .Marland Kitchenomeprazole (PRILOSEC) 20 MG capsule Take  20 mg by mouth 2 (two) times daily before a meal.  . ondansetron (ZOFRAN) 4 MG tablet TAKE ONE TABLET BY MOUTH EVERY 8 HOURS AS NEEDED FOR NAUSEA AND VOMITING  . Tiotropium Bromide Monohydrate (SPIRIVA RESPIMAT) 2.5 MCG/ACT AERS Inhale 2 puffs into the lungs daily. (Patient taking differently: Inhale 2 puffs into the lungs daily as needed.)  . VENTOLIN HFA 108 (90 Base) MCG/ACT inhaler INHALE 1 PUFF AS NEEDED  . vortioxetine HBr (TRINTELLIX) 20 MG TABS tablet Take 20 mg by mouth daily.  . cholestyramine (QUESTRAN) 4 g packet Take 4 grams daily with food.  . Na Sulfate-K Sulfate-Mg Sulf (SUPREP BOWEL PREP KIT) 17.5-3.13-1.6 GM/177ML SOLN Take 1 kit by mouth as directed.   No facility-administered encounter medications on file as of 06/14/2020.    Allergies (verified) Codeine and Penicillins   History: Past Medical History:  Diagnosis Date  . Adenomatous colon polyp   . Anxiety   . Bilateral cataracts    within 6 mos.  . Depression   . Diverticulosis   . Fatty liver   . GERD (gastroesophageal reflux disease)   . H. pylori infection   . Hiatal hernia   . Hyperlipidemia   . Ulcer 1988  . Uterine fibroid    Past Surgical History:  Procedure Laterality Date  . APPENDECTOMY    . CATARACT EXTRACTION, BILATERAL    . CHOLECYSTECTOMY    . COLONOSCOPY    . spontaneous vaginal delivery     x2  . TONSILLECTOMY    .  WISDOM TOOTH EXTRACTION     Family History  Problem Relation Age of Onset  . Alzheimer's disease Mother   . Depression Father   . Diabetes Father   . Breast cancer Sister   . Colon cancer Neg Hx   . Esophageal cancer Neg Hx   . Stomach cancer Neg Hx    Social History   Socioeconomic History  . Marital status: Single    Spouse name: Not on file  . Number of children: 2  . Years of education: 16  . Highest education level: Bachelor's degree (e.g., BA, AB, BS)  Occupational History  . 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 Smoking status:  Current Every Day Smoker    Packs/day: 0.50    Years: 53.00    Pack years: 26.50    Types: Cigarettes  . Smokeless tobacco: Never Used  Vaping Use  . Vaping Use: Never used  Substance and Sexual Activity  . Alcohol use: Yes    Alcohol/week: 10.0 standard drinks    Types: 10 Shots of liquor per week  . Drug use: No  . Sexual activity: Not Currently  Other Topics Concern  . Not on file  Social History Narrative   Patient is right-handed. She lives with her long time boyfriend of 25+ years ina 2 level home. She drinks green tea, 2-4 cups a day. She does not exercise.   Social Determinants of Health   Financial Resource Strain: Low Risk   . Difficulty of Paying Living Expenses: Not hard at all  Food Insecurity: No Food Insecurity  . Worried About Charity fundraiser in the Last Year: Never true  . Ran Out of Food in the Last Year: Never true  Transportation Needs: No Transportation Needs  . Lack of Transportation (Medical): No  . Lack of Transportation (Non-Medical): No  Physical Activity: Inactive  . Days of Exercise per Week: 0 days  . Minutes of Exercise per Session: 0 min  Stress: No Stress Concern Present  . Feeling of Stress : Not at all  Social Connections: Not on file    Tobacco Counseling Ready to quit: No Counseling given: Not Answered   Clinical Intake:  Pre-visit preparation completed: Yes  Pain : No/denies pain     Nutritional Status: BMI > 30  Obese Nutritional Risks: Nausea/ vomitting/ diarrhea (vomiting this morning) Diabetes: No  How often do you need to have someone help you when you read instructions, pamphlets, or other written materials from your doctor or pharmacy?: 1 - Never What is the last grade level you completed in school?: bachelor's degree  Diabetic? no  Interpreter Needed?: No  Information entered by :: Tara Davis   Activities of Daily Living In your present state of health, do you have any difficulty performing the following  activities: 06/14/2020  Hearing? N  Vision? Y  Comment has cataract  Difficulty concentrating or making decisions? N  Walking or climbing stairs? N  Dressing or bathing? N  Doing errands, shopping? N  Preparing Food and eating ? N  Using the Toilet? N  In the past six months, have you accidently leaked urine? Y  Comment wears a pad  Do you have problems with loss of bowel control? Y  Comment due to the diarrhea she was having, since cleared up  Managing your Medications? N  Managing your Finances? N  Housekeeping or managing your Housekeeping? N  Some recent data might be hidden    Patient  Care Team: Glendale Chard, MD as PCP - General (Internal Medicine) Pieter Partridge, DO as Consulting Physician (Neurology)  Indicate any recent Medical Services you may have received from other than [facility] providers in the past year (date may be approximate).     Assessment:   This is a routine wellness examination for Tara Davis.  Hearing/Vision screen  Hearing Screening   125Hz 250Hz 500Hz 1000Hz 2000Hz 3000Hz 4000Hz 6000Hz 8000Hz  Right ear:           Left ear:           Vision Screening Comments: Regular eye exams, Dr. Talbert Forest  Dietary issues and exercise activities discussed: Current Exercise Habits: The patient does not participate in regular exercise at present  Goals    . Patient Stated     06/14/2020, wants to weigh 150 pounds    . Quit Smoking     05/26/2019 quit smoking    . Weight (lb) < 200 lb (90.7 kg)     05/26/2019, wants to weigh 150 pounds       Depression Screen PHQ 2/9 Scores 06/14/2020 05/26/2019 04/21/2019 11/30/2018 04/13/2018 05/15/2016 05/27/2015  PHQ - 2 Score 3 0 5 0 0 0 0  PHQ- 9 Score _0 - - - -    Fall Risk Fall Risk  06/14/2020 05/26/2019 04/21/2019 01/17/2019 11/30/2018  Falls in the past year? 1 0 0 0 0  Comment fell down the stairs - - - -  Number falls in past yr: 0 - - - -  Injury with Fall? 1 - - - -  Comment cracked coccyx - - - -  Risk for fall due  to : Medication side effect Medication side effect - - -  Follow up Falls evaluation completed;Education provided;Falls prevention discussed Falls evaluation completed;Education provided;Falls prevention discussed - Falls evaluation completed -    FALL RISK PREVENTION PERTAINING TO THE HOME:  Any stairs in or around the home? Yes  If so, are there any without handrails? No  Home free of loose throw rugs in walkways, pet beds, electrical cords, etc? Yes  Adequate lighting in your home to reduce risk of falls? Yes   ASSISTIVE DEVICES UTILIZED TO PREVENT FALLS:  Life alert? No  Use of a cane, walker or w/c? No  Grab bars in the bathroom? No  Shower chair or bench in shower? No  Elevated toilet seat or a handicapped toilet? No   TIMED UP AND GO:  Was the test performed? No .   Gait steady and fast without use of assistive device  Cognitive Function:     6CIT Screen 06/14/2020 05/26/2019  What Year? 0 points 0 points  What month? 0 points 0 points  What time? 0 points 0 points  Count back from 20 0 points 0 points  Months in reverse 0 points 0 points  Repeat phrase 2 points 0 points  Total Score 2 0    Immunizations Immunization History  Administered Date(s) Administered  . Influenza, High Dose Seasonal PF 02/02/2017, 03/22/2019  . Influenza,inj,Quad PF,6+ Mos 05/20/2013  . Influenza-Unspecified 02/27/2016, 02/12/2018, 03/12/2020  . Moderna Sars-Covid-2 Vaccination 07/10/2019, 08/07/2019, 03/31/2020  . Pneumococcal Polysaccharide-23 02/02/2017  . Zoster Recombinat (Shingrix) 07/31/2017    TDAP status: Up to date  Flu Vaccine status: Up to date  Pneumococcal vaccine status: Up to date  Covid-19 vaccine status: Completed vaccines  Qualifies for Shingles Vaccine? Yes   Zostavax completed No   Shingrix Completed?: Yes  Screening  Tests Health Maintenance  Topic Date Due  . COLONOSCOPY (Pts 45-28yr Insurance coverage will need to be confirmed)  11/08/2019  .  MAMMOGRAM  12/29/2020  . TETANUS/TDAP  03/09/2021  . INFLUENZA VACCINE  Completed  . DEXA SCAN  Completed  . COVID-19 Vaccine  Completed  . Hepatitis C Screening  Completed  . PNA vac Low Risk Adult  Completed    Health Maintenance  Health Maintenance Due  Topic Date Due  . COLONOSCOPY (Pts 45-428yrInsurance coverage will need to be confirmed)  11/08/2019    Colorectal cancer screening: Type of screening: Colonoscopy. Completed 11/07/2016. Repeat every 3 years  Mammogram status: Completed 12/30/2019. Repeat every year  Bone Density status: Completed 01/30/2020. Results reflect: Bone density results: NORMAL. Repeat every 0 years.  Lung Cancer Screening: (Low Dose CT Chest recommended if Age 74-80ears, 30 pack-year currently smoking OR have quit w/in 15years.) does not qualify.   Lung Cancer Screening Referral: no  Additional Screening:  Hepatitis C Screening: does qualify; Completed 03/10/2016  Vision Screening: Recommended annual ophthalmology exams for early detection of glaucoma and other disorders of the eye. Is the patient up to date with their annual eye exam?  Yes  Who is the provider or what is the name of the office in which the patient attends annual eye exams? Dr. BeTommy Rainwaterf pt is not established with a provider, would they like to be referred to a provider to establish care? No .   Dental Screening: Recommended annual dental exams for proper oral hygiene  Community Resource Referral / Chronic Care Management: CRR required this visit?  No   CCM required this visit?  No      Plan:     I have personally reviewed and noted the following in the patient's chart:   . Medical and social history . Use of alcohol, tobacco or illicit drugs  . Current medications and supplements . Functional ability and status . Nutritional status . Physical activity . Advanced directives . List of other physicians . Hospitalizations, surgeries, and ER visits in previous 12  months . Vitals . Screenings to include cognitive, depression, and falls . Referrals and appointments  In addition, I have reviewed and discussed with patient certain preventive protocols, quality metrics, and best practice recommendations. A written personalized care plan for preventive services as well as general preventive health recommendations were provided to patient.     NiKellie SimmeringLPN   1/01/10/1516 Nurse Notes:

## 2020-06-20 DIAGNOSIS — F419 Anxiety disorder, unspecified: Secondary | ICD-10-CM | POA: Diagnosis not present

## 2020-06-20 DIAGNOSIS — F331 Major depressive disorder, recurrent, moderate: Secondary | ICD-10-CM | POA: Diagnosis not present

## 2020-06-28 ENCOUNTER — Other Ambulatory Visit: Payer: Self-pay | Admitting: Physician Assistant

## 2020-06-28 DIAGNOSIS — R1011 Right upper quadrant pain: Secondary | ICD-10-CM | POA: Diagnosis not present

## 2020-06-28 DIAGNOSIS — B379 Candidiasis, unspecified: Secondary | ICD-10-CM | POA: Diagnosis not present

## 2020-06-28 DIAGNOSIS — R197 Diarrhea, unspecified: Secondary | ICD-10-CM | POA: Diagnosis not present

## 2020-06-28 DIAGNOSIS — R109 Unspecified abdominal pain: Secondary | ICD-10-CM | POA: Diagnosis not present

## 2020-06-29 ENCOUNTER — Other Ambulatory Visit: Payer: Self-pay | Admitting: Physician Assistant

## 2020-06-29 ENCOUNTER — Ambulatory Visit
Admission: RE | Admit: 2020-06-29 | Discharge: 2020-06-29 | Disposition: A | Discharge status: Home / Self Care | Payer: Medicare Other | Source: Ambulatory Visit | Attending: Physician Assistant | Admitting: Physician Assistant

## 2020-06-29 DIAGNOSIS — R109 Unspecified abdominal pain: Secondary | ICD-10-CM | POA: Diagnosis not present

## 2020-06-29 DIAGNOSIS — R1011 Right upper quadrant pain: Secondary | ICD-10-CM

## 2020-06-29 DIAGNOSIS — R197 Diarrhea, unspecified: Secondary | ICD-10-CM | POA: Diagnosis not present

## 2020-07-05 DIAGNOSIS — F331 Major depressive disorder, recurrent, moderate: Secondary | ICD-10-CM | POA: Diagnosis not present

## 2020-07-11 ENCOUNTER — Other Ambulatory Visit: Payer: Medicare Other

## 2020-07-17 DIAGNOSIS — H26492 Other secondary cataract, left eye: Secondary | ICD-10-CM | POA: Diagnosis not present

## 2020-07-17 DIAGNOSIS — Z961 Presence of intraocular lens: Secondary | ICD-10-CM | POA: Diagnosis not present

## 2020-07-17 DIAGNOSIS — H18413 Arcus senilis, bilateral: Secondary | ICD-10-CM | POA: Diagnosis not present

## 2020-07-17 DIAGNOSIS — H26493 Other secondary cataract, bilateral: Secondary | ICD-10-CM | POA: Diagnosis not present

## 2020-07-18 ENCOUNTER — Other Ambulatory Visit: Payer: Medicare Other

## 2020-07-23 ENCOUNTER — Ambulatory Visit: Payer: Self-pay

## 2020-07-23 ENCOUNTER — Ambulatory Visit: Payer: Medicare Other | Admitting: Orthopedic Surgery

## 2020-07-23 ENCOUNTER — Encounter: Payer: Self-pay | Admitting: Orthopedic Surgery

## 2020-07-23 DIAGNOSIS — M5410 Radiculopathy, site unspecified: Secondary | ICD-10-CM | POA: Diagnosis not present

## 2020-07-23 DIAGNOSIS — I714 Abdominal aortic aneurysm, without rupture, unspecified: Secondary | ICD-10-CM

## 2020-07-23 DIAGNOSIS — M25551 Pain in right hip: Secondary | ICD-10-CM

## 2020-07-23 NOTE — Progress Notes (Signed)
Office Visit Note   Patient: Tara Davis           Date of Birth: [birth year]           MRN: [id number] Visit Date: 07/23/2020              Requested by: Glendale Chard, [address] [place],  [place] [address] PCP: Glendale Chard, MD  Chief Complaint  Patient presents with  . Right Hip - Pain      HPI: Patient is a 75 year old woman who presents complaining of right groin pain.  Patient states it is worse when she gets up out of a chair and takes a few steps.  She states she is status post a coccyx fracture about 6 months ago.  She has been occasionally using ibuprofen without relief.  Assessment & Plan: Visit Diagnoses:  1. Pain in right hip   2. Abdominal aortic aneurysm (AAA) without rupture (HCC)   3. Radicular pain of right lower extremity     Plan: Patient will try Aleve 2 p.o. twice daily for her radicular symptoms instead of the ibuprofen.  Patient states she does not want to try prednisone.  We will make a consult with vascular vein surgery to evaluate her abdominal aortic aneurysm.  We'll follow-up in 4 weeks.  If she is still symptomatic will order an MRI scan of the lumbar spine.  Follow-Up Instructions: No follow-ups on file.   Ortho Exam  Patient is alert, oriented, no adenopathy, well-dressed, normal affect, normal respiratory effort. Examination patient has a normal gait no abductor lurch.  She has no pain with range of motion of her hip she has a negative straight leg raise no focal motor weakness.  Patient's symptoms seem to be primarily radicular in nature with her significant degenerative disc disease and a asymptomatic abdominal aortic aneurysm.  Imaging: XR HIP UNILAT W OR W/O PELVIS 2-3 VIEWS RIGHT  Result Date: 07/23/2020 2 view radiographs of the right hip shows mild joint space narrowing's consistent with the left hip there is no subcondylar cyst or sclerosis no bony spurs.  No evidence of a pelvic fracture.  XR Lumbar Spine 2-3  Views  Result Date: 07/23/2020 2 view radiographs of the lumbar spine shows extensive calcification posteriorly with joint space collapse and a spondylolisthesis at L4-5 grade 1 with collapse worse at L5-S1.  Patient has extensive calcification of the aorta more proximally she has an aneurysm that is 4 cm in diameter.  No images are attached to the encounter.  Labs: Lab Results  Component Value Date   HGBA1C 5.9 (H) 04/13/2018   LABORGA Multiple bacterial morphotypes present, none 12/17/2014   LABORGA predominant. Suggest appropriate recollection if  12/17/2014   LABORGA clinically indicated. 12/17/2014     Lab Results  Component Value Date   ALBUMIN 4.5 12/07/2018   ALBUMIN 4.6 04/13/2018   ALBUMIN 4.4 12/02/2016    Lab Results  Component Value Date   MG 2.1 01/07/2008   Lab Results  Component Value Date   VD25OH 31.2 04/13/2018    No results found for: PREALBUMIN CBC EXTENDED Latest Ref Rng & Units 12/07/2018 04/13/2018 08/22/2015  WBC 3.4 - 10.8 x10E3/uL 9.7 8.7 9.2  RBC 3.77 - 5.28 x10E6/uL 4.34 4.38 4.54  HGB 11.1 - 15.9 g/dL 14.5 14.6 15.1(H)  HCT 34.0 - 46.6 % 41.9 42.2 44.1  PLT 150 - 450 x10E3/uL 299 289 323.0  NEUTROABS 1.4 - 7.0 x10E3/uL 6.8 - 5.5  LYMPHSABS 0.7 - 3.1 x10E3/uL 2.1 - 3.1     There is no height or weight on file to calculate BMI.  Orders:  Orders Placed This Encounter  Procedures  . XR HIP UNILAT W OR W/O PELVIS 2-3 VIEWS RIGHT  . XR Lumbar Spine 2-3 Views  . Ambulatory referral to Vascular Surgery   No orders of the defined types were placed in this encounter.    Procedures: No procedures performed  Clinical Data: No additional findings.  ROS:  All other systems negative, except as noted in the HPI. Review of Systems  Objective: Vital Signs: There were no vitals taken for this visit.  Specialty Comments:  No specialty comments available.  PMFS History: Patient Active Problem List   Diagnosis Date Noted  .  Gastroesophageal reflux disease 08/23/2019  . Tobacco abuse 01/12/2018  . Cigarette smoker 01/12/2018  . Pelvic pain 12/02/2016  . NAUSEA WITH VOMITING 04/12/2010  . IBS 07/05/2008   Past Medical History:  Diagnosis Date  . Adenomatous colon polyp   . Anxiety   . Bilateral cataracts    within 6 mos.  . Depression   . Diverticulosis   . Fatty liver   . GERD (gastroesophageal reflux disease)   . H. pylori infection   . Hiatal hernia   . Hyperlipidemia   . Ulcer 1988  . Uterine fibroid     Family History  Problem Relation Age of Onset  . Alzheimer's disease Mother   . Depression Father   . Diabetes Father   . Breast cancer Sister   . Colon cancer Neg Hx   . Esophageal cancer Neg Hx   . Stomach cancer Neg Hx     Past Surgical History:  Procedure Laterality Date  . APPENDECTOMY    . CATARACT EXTRACTION, BILATERAL    . CHOLECYSTECTOMY    . COLONOSCOPY    . spontaneous vaginal delivery     x2  . TONSILLECTOMY    . WISDOM TOOTH EXTRACTION     Social History   Occupational History  . 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 Smoking status: Current Every Day Smoker    Packs/day: 0.50    Years: 53.00    Pack years: 26.50    Types: Cigarettes  . Smokeless tobacco: Never Used  Vaping Use  . Vaping Use: Never used  Substance and Sexual Activity  . Alcohol use: Yes    Alcohol/week: 10.0 standard drinks    Types: 10 Shots of liquor per week  . Drug use: No  . Sexual activity: Not Currently

## 2020-08-07 ENCOUNTER — Other Ambulatory Visit: Payer: Self-pay

## 2020-08-07 DIAGNOSIS — Z860100 Personal history of colon polyps, unspecified: Secondary | ICD-10-CM | POA: Insufficient documentation

## 2020-08-07 DIAGNOSIS — I714 Abdominal aortic aneurysm, without rupture, unspecified: Secondary | ICD-10-CM

## 2020-08-14 ENCOUNTER — Ambulatory Visit: Payer: Medicare Other | Admitting: Vascular Surgery

## 2020-08-14 ENCOUNTER — Encounter: Payer: Self-pay | Admitting: Vascular Surgery

## 2020-08-14 ENCOUNTER — Other Ambulatory Visit: Payer: Self-pay

## 2020-08-14 ENCOUNTER — Ambulatory Visit (HOSPITAL_COMMUNITY)
Admission: RE | Admit: 2020-08-14 | Discharge: 2020-08-14 | Disposition: A | Discharge status: Home / Self Care | Payer: Medicare Other | Source: Ambulatory Visit | Attending: Vascular Surgery | Admitting: Vascular Surgery

## 2020-08-14 VITALS — BP 134/69 | HR 62 | Temp 97.7°F | Resp 16 | Ht 62.5 in | Wt 184.0 lb

## 2020-08-14 DIAGNOSIS — R109 Unspecified abdominal pain: Secondary | ICD-10-CM | POA: Diagnosis not present

## 2020-08-14 DIAGNOSIS — R112 Nausea with vomiting, unspecified: Secondary | ICD-10-CM | POA: Diagnosis not present

## 2020-08-14 DIAGNOSIS — Z8711 Personal history of peptic ulcer disease: Secondary | ICD-10-CM | POA: Diagnosis not present

## 2020-08-14 DIAGNOSIS — Z720 Tobacco use: Secondary | ICD-10-CM

## 2020-08-14 DIAGNOSIS — Z86010 Personal history of colonic polyps: Secondary | ICD-10-CM | POA: Diagnosis not present

## 2020-08-14 DIAGNOSIS — I714 Abdominal aortic aneurysm, without rupture, unspecified: Secondary | ICD-10-CM

## 2020-08-14 DIAGNOSIS — R110 Nausea: Secondary | ICD-10-CM | POA: Diagnosis not present

## 2020-08-14 DIAGNOSIS — R197 Diarrhea, unspecified: Secondary | ICD-10-CM | POA: Diagnosis not present

## 2020-08-14 DIAGNOSIS — K529 Noninfective gastroenteritis and colitis, unspecified: Secondary | ICD-10-CM | POA: Diagnosis not present

## 2020-08-14 DIAGNOSIS — Z8719 Personal history of other diseases of the digestive system: Secondary | ICD-10-CM | POA: Diagnosis not present

## 2020-08-14 DIAGNOSIS — R159 Full incontinence of feces: Secondary | ICD-10-CM | POA: Diagnosis not present

## 2020-08-14 DIAGNOSIS — Z9049 Acquired absence of other specified parts of digestive tract: Secondary | ICD-10-CM | POA: Diagnosis not present

## 2020-08-14 NOTE — Progress Notes (Signed)
Patient name: Tara Davis MRN: [id number] DOB: 07/26/1945 Sex: female  REASON FOR CONSULT: Evaluate for suspected 4 cm abdominal aortic aneurysm  HPI: Tara Davis is a 75 y.o. female, with history of depression and GERD the presents as a referral from Dr. Sharol Given with concern for a 4 cm abdominal aortic aneurysm.  Patient states she has no prior knowledge of her aneurysm.  She has no abdominal pain or back pain.  She was being evaluated with x-ray of her lumbar spine and hip on 07/23/2020 with concern for 4 cm abdominal aortic aneurysm that was identified incidentally on plain x-ray.  Patient does have a long history of tobacco abuse and smokes about a pack a day.  No pain in her legs when she walks.  Previous abdominal surgery includes cholecystectomy and appendectomy.  Past Medical History:  Diagnosis Date  . Adenomatous colon polyp   . Anxiety   . Bilateral cataracts    within 6 mos.  . Depression   . Diverticulosis   . Fatty liver   . GERD (gastroesophageal reflux disease)   . H. pylori infection   . Hiatal hernia   . Hyperlipidemia   . Ulcer 1988  . Uterine fibroid     Past Surgical History:  Procedure Laterality Date  . APPENDECTOMY    . CATARACT EXTRACTION, BILATERAL    . CHOLECYSTECTOMY    . COLONOSCOPY    . spontaneous vaginal delivery     x2  . TONSILLECTOMY    . WISDOM TOOTH EXTRACTION      Family History  Problem Relation Age of Onset  . Alzheimer's disease Mother   . Depression Father   . Diabetes Father   . Breast cancer Sister   . Colon cancer Neg Hx   . Esophageal cancer Neg Hx   . Stomach cancer Neg Hx     SOCIAL HISTORY: Social History   Socioeconomic History  . Marital status: Single    Spouse name: Not on file  . Number of children: 2  . Years of education: 16  . Highest education level: Bachelor's degree (e.g., BA, AB, BS)  Occupational History  . 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 Smoking status: Current  Every Day Smoker    Packs/day: 0.50    Years: 53.00    Pack years: 26.50    Types: Cigarettes  . Smokeless tobacco: Never Used  Vaping Use  . Vaping Use: Never used  Substance and Sexual Activity  . Alcohol use: Yes    Alcohol/week: 10.0 standard drinks    Types: 10 Shots of liquor per week  . Drug use: No  . Sexual activity: Not Currently  Other Topics Concern  . Not on file  Social History Narrative   Patient is right-handed. She lives with her long time boyfriend of 25+ years ina 2 level home. She drinks green tea, 2-4 cups a day. She does not exercise.   Social Determinants of Health   Financial Resource Strain: Low Risk   . Difficulty of Paying Living Expenses: Not hard at all  Food Insecurity: No Food Insecurity  . Worried About Charity fundraiser in the Last Year: Never true  . Ran Out of Food in the Last Year: Never true  Transportation Needs: No Transportation Needs  . Lack of Transportation (Medical): No  . Lack of Transportation (Non-Medical): No  Physical Activity: Inactive  . Days of Exercise per Week: 0 days  .  Minutes of Exercise per Session: 0 min  Stress: No Stress Concern Present  . Feeling of Stress : Not at all  Social Connections: Not on file  Intimate Partner Violence: Not on file    Allergies  Allergen Reactions  . Amoxicillin-Pot Clavulanate Other (See Comments)  . Codeine Other (See Comments)    REACTION: hallucinations  . Penicillins Itching    Current Outpatient Medications  Medication Sig Dispense Refill  . atorvastatin (LIPITOR) 20 MG tablet TAKE ONE TABLET BY MOUTH DAILY 90 tablet 1  . Loperamide HCl (IMODIUM PO) Take 2 tablets by mouth daily as needed.     Marland Kitchen LORazepam (ATIVAN) 0.5 MG tablet Take 1 mg by mouth 2 (two) times daily.     . Na Sulfate-K Sulfate-Mg Sulf (SUPREP BOWEL PREP KIT) 17.5-3.13-1.6 GM/177ML SOLN Take 1 kit by mouth as directed. 324 mL 0  . omeprazole (PRILOSEC) 20 MG capsule Take 20 mg by mouth 2 (two) times daily  before a meal.    . vortioxetine HBr (TRINTELLIX) 20 MG TABS tablet Take 20 mg by mouth daily.    . benzonatate (TESSALON) 100 MG capsule Take 1-2 capsules (100-200 mg total) by mouth 3 (three) times daily as needed for cough. 40 capsule 0  . cholestyramine (QUESTRAN) 4 g packet Take 4 grams daily with food. 30 packet 6  . Investigational vitamin D 600 UNITS capsule SWOG S0812 Take 600 Units by mouth daily. Take with food.    . ondansetron (ZOFRAN) 4 MG tablet TAKE ONE TABLET BY MOUTH EVERY 8 HOURS AS NEEDED FOR NAUSEA AND VOMITING 20 tablet 0  . Tiotropium Bromide Monohydrate (SPIRIVA RESPIMAT) 2.5 MCG/ACT AERS Inhale 2 puffs into the lungs daily. (Patient taking differently: Inhale 2 puffs into the lungs daily as needed.) 1 Inhaler 3  . VENTOLIN HFA 108 (90 Base) MCG/ACT inhaler INHALE 1 PUFF AS NEEDED  2   No current facility-administered medications for this visit.    REVIEW OF SYSTEMS:  $RemoveB'[X]'bhIhytYR$  denotes positive finding, $RemoveBeforeDEI'[ ]'[id number]$  denotes negative finding Cardiac  Comments:  Chest pain or chest pressure:    Shortness of breath upon exertion: x   Short of breath when lying flat:    Irregular heart rhythm:        Vascular    Pain in calf, thigh, or hip brought on by ambulation:    Pain in feet at night that wakes you up from your sleep:     Blood clot in your veins:    Leg swelling:         Pulmonary    Oxygen at home:    Productive cough:     Wheezing:         Neurologic    Sudden weakness in arms or legs:     Sudden numbness in arms or legs:     Sudden onset of difficulty speaking or slurred speech:    Temporary loss of vision in one eye:     Problems with dizziness:         Gastrointestinal    Blood in stool:     Vomited blood:         Genitourinary    Burning when urinating:     Blood in urine:        Psychiatric    Major depression:         Hematologic    Bleeding problems:    Problems with blood clotting too easily:        Skin  Rashes or ulcers:         Constitutional    Fever or chills:      PHYSICAL EXAM: Vitals:   08/14/20 0849  BP: 134/69  Pulse: 62  Resp: 16  Temp: 97.7 F (36.5 C)  TempSrc: Temporal  SpO2: 97%  Weight: 184 lb (83.5 kg)  Height: 5' 2.5" (1.588 m)    GENERAL: The patient is a well-nourished female, in no acute distress. The vital signs are documented above. CARDIAC: There is a regular rate and rhythm.  VASCULAR:  Palpable femoral pulses bilateral groins Palpable DP pulses bilaterally PULMONARY: No respiratory distress. ABDOMEN: Soft and non-tender. MUSCULOSKELETAL: There are no major deformities or cyanosis. NEUROLOGIC: No focal weakness or paresthesias are detected. SKIN: There are no ulcers or rashes noted. PSYCHIATRIC: The patient has a normal affect.  DATA:   I did review her plain films from 07/23/2020 and I do see the abnormal dilation in the abdominal aorta where there is concern aneurysm.  Of interest she had a CT abdomen pelvis with contrast 05/23/2020 about 3 months ago that shows no evidence of abdominal aortic aneurysm or distal thoracic aneurysm.  AAA duplex today shows no evidence of abdominal aortic aneurysm.  Assessment/Plan:  75 year old female presents from Dr. Jess Barters office with concern for 4 cm abdominal aortic aneurysm after a recent x-ray to evaluate her hip and lumbar spine.  I reviewed the patient's CT scan abdomen pelvis with contrast from 05/23/2020 that shows no evidence of abdominal aortic aneurysm.  In addition her AAA duplex today here in the office where he had very good images did not show any evidence of aortic dilation or aneurysm.  I do not think she has an aneurysm and I do not think she needs any further surveillance from our office at this time.  She does have a calcified aorta and long history of tobacco abuse, but no lower extremity symptoms at this time to warrant intervention or surveillance.   Marty Heck, MD [facility] Office: [phone]

## 2020-08-15 ENCOUNTER — Telehealth: Payer: Self-pay | Admitting: Orthopedic Surgery

## 2020-08-15 NOTE — Telephone Encounter (Signed)
Appt made and pt states the aleeve is working well for her so she doesn't want to do an injection.

## 2020-08-15 NOTE — Telephone Encounter (Signed)
Can you please call pt and make an appt in 4 weeks with Dr. Sharol Given please.

## 2020-08-15 NOTE — Telephone Encounter (Signed)
Patient called advised Dr Carlis [facility] confirmed yesterday that she do not have an aneurysm. Patient said thank you Dr Sharol Given for alerting her.     The number to contact patient  Is [phone]

## 2020-08-15 NOTE — Telephone Encounter (Signed)
LVM for pt to CB

## 2020-08-15 NOTE — Telephone Encounter (Signed)
We just need to see her back in about 4 weeks for possible MRI scan of her lumbar spine

## 2020-08-15 NOTE — Telephone Encounter (Signed)
You saw this pt in the office 07/23/20 for hip and back pain. Please see message below and let me know what it is that you would like to do.

## 2020-08-17 DIAGNOSIS — H26491 Other secondary cataract, right eye: Secondary | ICD-10-CM | POA: Diagnosis not present

## 2020-08-17 DIAGNOSIS — H26493 Other secondary cataract, bilateral: Secondary | ICD-10-CM | POA: Diagnosis not present

## 2020-08-20 ENCOUNTER — Telehealth: Payer: Self-pay

## 2020-08-20 ENCOUNTER — Ambulatory Visit: Payer: Medicare Other | Admitting: Orthopedic Surgery

## 2020-08-20 NOTE — Telephone Encounter (Signed)
Lvm for patient to return call to schedule phys with RG

## 2020-08-24 ENCOUNTER — Encounter: Payer: Medicare Other | Admitting: Nurse Practitioner

## 2020-09-03 ENCOUNTER — Ambulatory Visit: Payer: Medicare Other | Admitting: Orthopedic Surgery

## 2020-09-10 ENCOUNTER — Ambulatory Visit: Payer: Medicare Other | Admitting: Orthopedic Surgery

## 2020-09-25 ENCOUNTER — Other Ambulatory Visit: Payer: Self-pay | Admitting: Gastroenterology

## 2020-10-03 DIAGNOSIS — F331 Major depressive disorder, recurrent, moderate: Secondary | ICD-10-CM | POA: Diagnosis not present

## 2020-10-30 DIAGNOSIS — R109 Unspecified abdominal pain: Secondary | ICD-10-CM | POA: Diagnosis not present

## 2020-10-30 DIAGNOSIS — R110 Nausea: Secondary | ICD-10-CM | POA: Diagnosis not present

## 2020-10-30 DIAGNOSIS — K529 Noninfective gastroenteritis and colitis, unspecified: Secondary | ICD-10-CM | POA: Diagnosis not present

## 2020-10-30 DIAGNOSIS — K219 Gastro-esophageal reflux disease without esophagitis: Secondary | ICD-10-CM | POA: Diagnosis not present

## 2020-10-30 DIAGNOSIS — Z86010 Personal history of colonic polyps: Secondary | ICD-10-CM | POA: Diagnosis not present

## 2020-11-14 ENCOUNTER — Other Ambulatory Visit: Payer: Self-pay | Admitting: Internal Medicine

## 2020-12-26 ENCOUNTER — Other Ambulatory Visit: Payer: Self-pay

## 2020-12-26 NOTE — Telephone Encounter (Signed)
I left the pt a message that I was calling to schedule her an appt.

## 2021-01-01 DIAGNOSIS — F331 Major depressive disorder, recurrent, moderate: Secondary | ICD-10-CM | POA: Diagnosis not present

## 2021-01-11 DIAGNOSIS — Z803 Family history of malignant neoplasm of breast: Secondary | ICD-10-CM | POA: Diagnosis not present

## 2021-01-11 DIAGNOSIS — Z1231 Encounter for screening mammogram for malignant neoplasm of breast: Secondary | ICD-10-CM | POA: Diagnosis not present

## 2021-01-11 LAB — HM MAMMOGRAPHY

## 2021-01-15 ENCOUNTER — Encounter: Payer: Self-pay | Admitting: Internal Medicine

## 2021-02-12 DIAGNOSIS — F331 Major depressive disorder, recurrent, moderate: Secondary | ICD-10-CM | POA: Diagnosis not present

## 2021-03-13 DIAGNOSIS — K219 Gastro-esophageal reflux disease without esophagitis: Secondary | ICD-10-CM | POA: Diagnosis not present

## 2021-03-13 DIAGNOSIS — R110 Nausea: Secondary | ICD-10-CM | POA: Diagnosis not present

## 2021-03-13 DIAGNOSIS — K529 Noninfective gastroenteritis and colitis, unspecified: Secondary | ICD-10-CM | POA: Diagnosis not present

## 2021-03-13 DIAGNOSIS — Z79899 Other long term (current) drug therapy: Secondary | ICD-10-CM | POA: Diagnosis not present

## 2021-03-13 DIAGNOSIS — R112 Nausea with vomiting, unspecified: Secondary | ICD-10-CM | POA: Diagnosis not present

## 2021-03-13 DIAGNOSIS — R109 Unspecified abdominal pain: Secondary | ICD-10-CM | POA: Diagnosis not present

## 2021-03-13 DIAGNOSIS — Z86010 Personal history of colonic polyps: Secondary | ICD-10-CM | POA: Diagnosis not present

## 2021-03-27 DIAGNOSIS — F331 Major depressive disorder, recurrent, moderate: Secondary | ICD-10-CM | POA: Diagnosis not present

## 2021-04-25 DIAGNOSIS — H04123 Dry eye syndrome of bilateral lacrimal glands: Secondary | ICD-10-CM | POA: Diagnosis not present

## 2021-06-11 ENCOUNTER — Other Ambulatory Visit: Payer: Self-pay | Admitting: Internal Medicine

## 2021-06-19 ENCOUNTER — Telehealth: Payer: Self-pay

## 2021-06-19 NOTE — Telephone Encounter (Signed)
This nurse attempted to call patient due to missed AWV. Message left that we will try to call her again and she can call us back to reschedule.

## 2021-06-25 ENCOUNTER — Telehealth: Payer: Self-pay | Admitting: Internal Medicine

## 2021-06-25 NOTE — Telephone Encounter (Signed)
Left message for patient to call back and schedule Medicare Annual Wellness Visit (AWV) either virtually or in office.  Left my Herbie Drape number [phone]    Last AWV 06/14/20 please schedule at anytime with [facility]    This should be a 45 minute visit.

## 2021-07-09 ENCOUNTER — Other Ambulatory Visit: Payer: Self-pay

## 2021-07-09 ENCOUNTER — Ambulatory Visit: Payer: Self-pay

## 2021-07-09 ENCOUNTER — Ambulatory Visit: Payer: Medicare Other | Admitting: Orthopedic Surgery

## 2021-07-09 DIAGNOSIS — G8929 Other chronic pain: Secondary | ICD-10-CM

## 2021-07-09 DIAGNOSIS — M5410 Radiculopathy, site unspecified: Secondary | ICD-10-CM

## 2021-07-09 DIAGNOSIS — M25562 Pain in left knee: Secondary | ICD-10-CM | POA: Diagnosis not present

## 2021-07-11 ENCOUNTER — Ambulatory Visit: Payer: Medicare Other | Admitting: Orthopedic Surgery

## 2021-07-14 ENCOUNTER — Encounter: Payer: Self-pay | Admitting: Orthopedic Surgery

## 2021-07-14 DIAGNOSIS — G8929 Other chronic pain: Secondary | ICD-10-CM

## 2021-07-14 DIAGNOSIS — M5410 Radiculopathy, site unspecified: Secondary | ICD-10-CM | POA: Diagnosis not present

## 2021-07-14 DIAGNOSIS — M25562 Pain in left knee: Secondary | ICD-10-CM | POA: Diagnosis not present

## 2021-07-14 MED ORDER — METHYLPREDNISOLONE ACETATE 40 MG/ML IJ SUSP
40.0000 mg | INTRAMUSCULAR | Status: AC | PRN
  Administered 2021-07-14: 40 mg via INTRA_ARTICULAR

## 2021-07-14 MED ORDER — LIDOCAINE HCL (PF) 1 % IJ SOLN
5.0000 mL | INTRAMUSCULAR | Status: AC | PRN
  Administered 2021-07-14: 5 mL

## 2021-07-14 NOTE — Progress Notes (Signed)
Office Visit Note   Patient: Tara Davis           Date of Birth: [birth year]           MRN: [id number] Visit Date: 07/09/2021              Requested by: Glendale Chard, [address] [place],  [place] [address] PCP: Glendale Chard, MD  Chief Complaint  Patient presents with   Left Knee - Pain   Right Leg - Pain      HPI: Patient is a 76 year old woman who presents with a 1 month history of left knee pain.  She states she noticed this after a trip to [place] with walking all day long.  She complains of pain anteriorly with swelling popping and start up stiffness.  She has used ibuprofen and Tylenol.  Assessment & Plan: Visit Diagnoses:  1. Chronic pain of left knee     Plan: Left knee was injected she tolerated this well  Follow-Up Instructions: Return if symptoms worsen or fail to improve.   Ortho Exam  Patient is alert, oriented, no adenopathy, well-dressed, normal affect, normal respiratory effort. Examination patient has tenderness to palpation of the medial joint line Clauser cruciates are stable there is crepitation with range of motion.  Examination of the right leg she does have some quad pain which started with groin pain about 6 months ago.  She states that the groin pain has resolved.  She has no pain with internal and external rotation of the right hip her pain seems to radiate from the lateral hip she has a negative straight leg raise there is no focal motor weakness.  Imaging: No results found. No images are attached to the encounter.  Labs: Lab Results  Component Value Date   HGBA1C 5.9 (H) 04/13/2018   LABORGA Multiple bacterial morphotypes present, none 12/17/2014   LABORGA predominant. Suggest appropriate recollection if 12/17/2014   LABORGA clinically indicated. 12/17/2014     Lab Results  Component Value Date   ALBUMIN 4.5 12/07/2018   ALBUMIN 4.6 04/13/2018   ALBUMIN 4.4 12/02/2016    Lab Results  Component Value Date    MG 2.1 01/07/2008   Lab Results  Component Value Date   VD25OH 31.2 04/13/2018    No results found for: PREALBUMIN CBC EXTENDED Latest Ref Rng & Units 12/07/2018 04/13/2018 08/22/2015  WBC 3.4 - 10.8 x10E3/uL 9.7 8.7 9.2  RBC 3.77 - 5.28 x10E6/uL 4.34 4.38 4.54  HGB 11.1 - 15.9 g/dL 14.5 14.6 15.1(H)  HCT 34.0 - 46.6 % 41.9 42.2 44.1  PLT 150 - 450 x10E3/uL 299 289 323.0  NEUTROABS 1.4 - 7.0 x10E3/uL 6.8 - 5.5  LYMPHSABS 0.7 - 3.1 x10E3/uL 2.1 - 3.1     There is no height or weight on file to calculate BMI.  Orders:  Orders Placed This Encounter  Procedures   XR Knee 1-2 Views Left   No orders of the defined types were placed in this encounter.    Procedures: Large Joint Inj: L knee on 07/14/2021 8:19 PM Indications: pain and diagnostic evaluation Details: 22 G 1.5 in needle, anteromedial approach  Arthrogram: No  Medications: 5 mL lidocaine (PF) 1 %; 40 mg methylPREDNISolone acetate 40 MG/ML Outcome: tolerated well, no immediate complications Procedure, treatment alternatives, risks and benefits explained, specific risks discussed. Consent was given by the patient. Immediately prior to procedure a time out was called to verify the correct patient, procedure, equipment, support  staff and site/side marked as required. Patient was prepped and draped in the usual sterile fashion.     Clinical Data: No additional findings.  ROS:  All other systems negative, except as noted in the HPI. Review of Systems  Objective: Vital Signs: There were no vitals taken for this visit.  Specialty Comments:  No specialty comments available.  PMFS History: Patient Active Problem List   Diagnosis Date Noted   Personal history of colonic polyps 08/07/2020   Gastroesophageal reflux disease 08/23/2019   Tobacco abuse 01/12/2018   Cigarette smoker 01/12/2018   IBS 07/05/2008   Past Medical History:  Diagnosis Date   Adenomatous colon polyp    Anxiety    Bilateral cataracts     within 6 mos.   Depression    Diverticulosis    Fatty liver    GERD (gastroesophageal reflux disease)    H. 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

## 2021-07-15 ENCOUNTER — Ambulatory Visit: Payer: Medicare Other | Admitting: Orthopedic Surgery

## 2021-07-23 ENCOUNTER — Telehealth: Payer: Self-pay | Admitting: Orthopedic Surgery

## 2021-07-23 ENCOUNTER — Ambulatory Visit: Payer: Medicare Other | Admitting: Internal Medicine

## 2021-07-23 ENCOUNTER — Other Ambulatory Visit: Payer: Self-pay | Admitting: Orthopedic Surgery

## 2021-07-23 DIAGNOSIS — G8929 Other chronic pain: Secondary | ICD-10-CM

## 2021-07-23 NOTE — Telephone Encounter (Signed)
Patient called advised she is still having a lot of pain in her left knee when she climb stairs. Patient said when she is not moving her knee she is not experiencing any pain. Patient asked what can be done next. Patient said she is having a pain in her right glut and using  heat does not work. The number to contact patient is [phone]

## 2021-07-23 NOTE — Telephone Encounter (Signed)
Pt has history of right hip pain, used aleve as directed for pain and tylenol.  She is s/p cortisone inj in her left knee 07/09/21.

## 2021-07-24 ENCOUNTER — Ambulatory Visit: Payer: Medicare Other | Admitting: Internal Medicine

## 2021-07-24 NOTE — Telephone Encounter (Signed)
Pt informed of the below.

## 2021-08-05 DIAGNOSIS — F331 Major depressive disorder, recurrent, moderate: Secondary | ICD-10-CM | POA: Diagnosis not present

## 2021-08-12 ENCOUNTER — Ambulatory Visit
Admission: RE | Admit: 2021-08-12 | Discharge: 2021-08-12 | Disposition: A | Discharge status: Home / Self Care | Payer: Medicare Other | Source: Ambulatory Visit | Attending: Orthopedic Surgery | Admitting: Orthopedic Surgery

## 2021-08-12 ENCOUNTER — Other Ambulatory Visit: Payer: Self-pay

## 2021-08-12 DIAGNOSIS — M25562 Pain in left knee: Secondary | ICD-10-CM

## 2021-08-12 DIAGNOSIS — G8929 Other chronic pain: Secondary | ICD-10-CM

## 2021-08-13 ENCOUNTER — Telehealth: Payer: Self-pay | Admitting: Orthopedic Surgery

## 2021-08-13 NOTE — Telephone Encounter (Signed)
Can you review this pt's MRI and advise?  ?

## 2021-08-13 NOTE — Telephone Encounter (Signed)
Pt called and would like someone to call her to discuss her mri results.  ? ?CB [phone]  ?

## 2021-08-16 DIAGNOSIS — Z0100 Encounter for examination of eyes and vision without abnormal findings: Secondary | ICD-10-CM | POA: Diagnosis not present

## 2021-08-22 ENCOUNTER — Encounter: Payer: Self-pay | Admitting: Internal Medicine

## 2021-09-02 ENCOUNTER — Telehealth: Payer: Self-pay

## 2021-09-02 NOTE — Telephone Encounter (Signed)
Patient would like to speak with Dr. Sharol Given or [facility] before scheduling surgery she stated that she had a few questions.  ? ?Please advise  ?

## 2021-09-03 NOTE — Telephone Encounter (Signed)
I called pt and she would like to proceed with the left total knee replacement. First she would like to have eval of her right hip and appt has been made for 09/10/21 at 3 pm. Advised will proceed at that time with surgical sheet once clearance has been given for right hip per her request and will sch then. Will call with any other questions.  ?

## 2021-09-04 IMAGING — CR DG ABDOMEN 2V
2 series · 2 of 2 positions shown · non-contrast
Comparison: 05/23/2020 and prior.

CLINICAL DATA: RIGHT UPPER QUADRANT PAIN

EXAM:
ABDOMEN - 2 VIEW

[t abdomen supine]
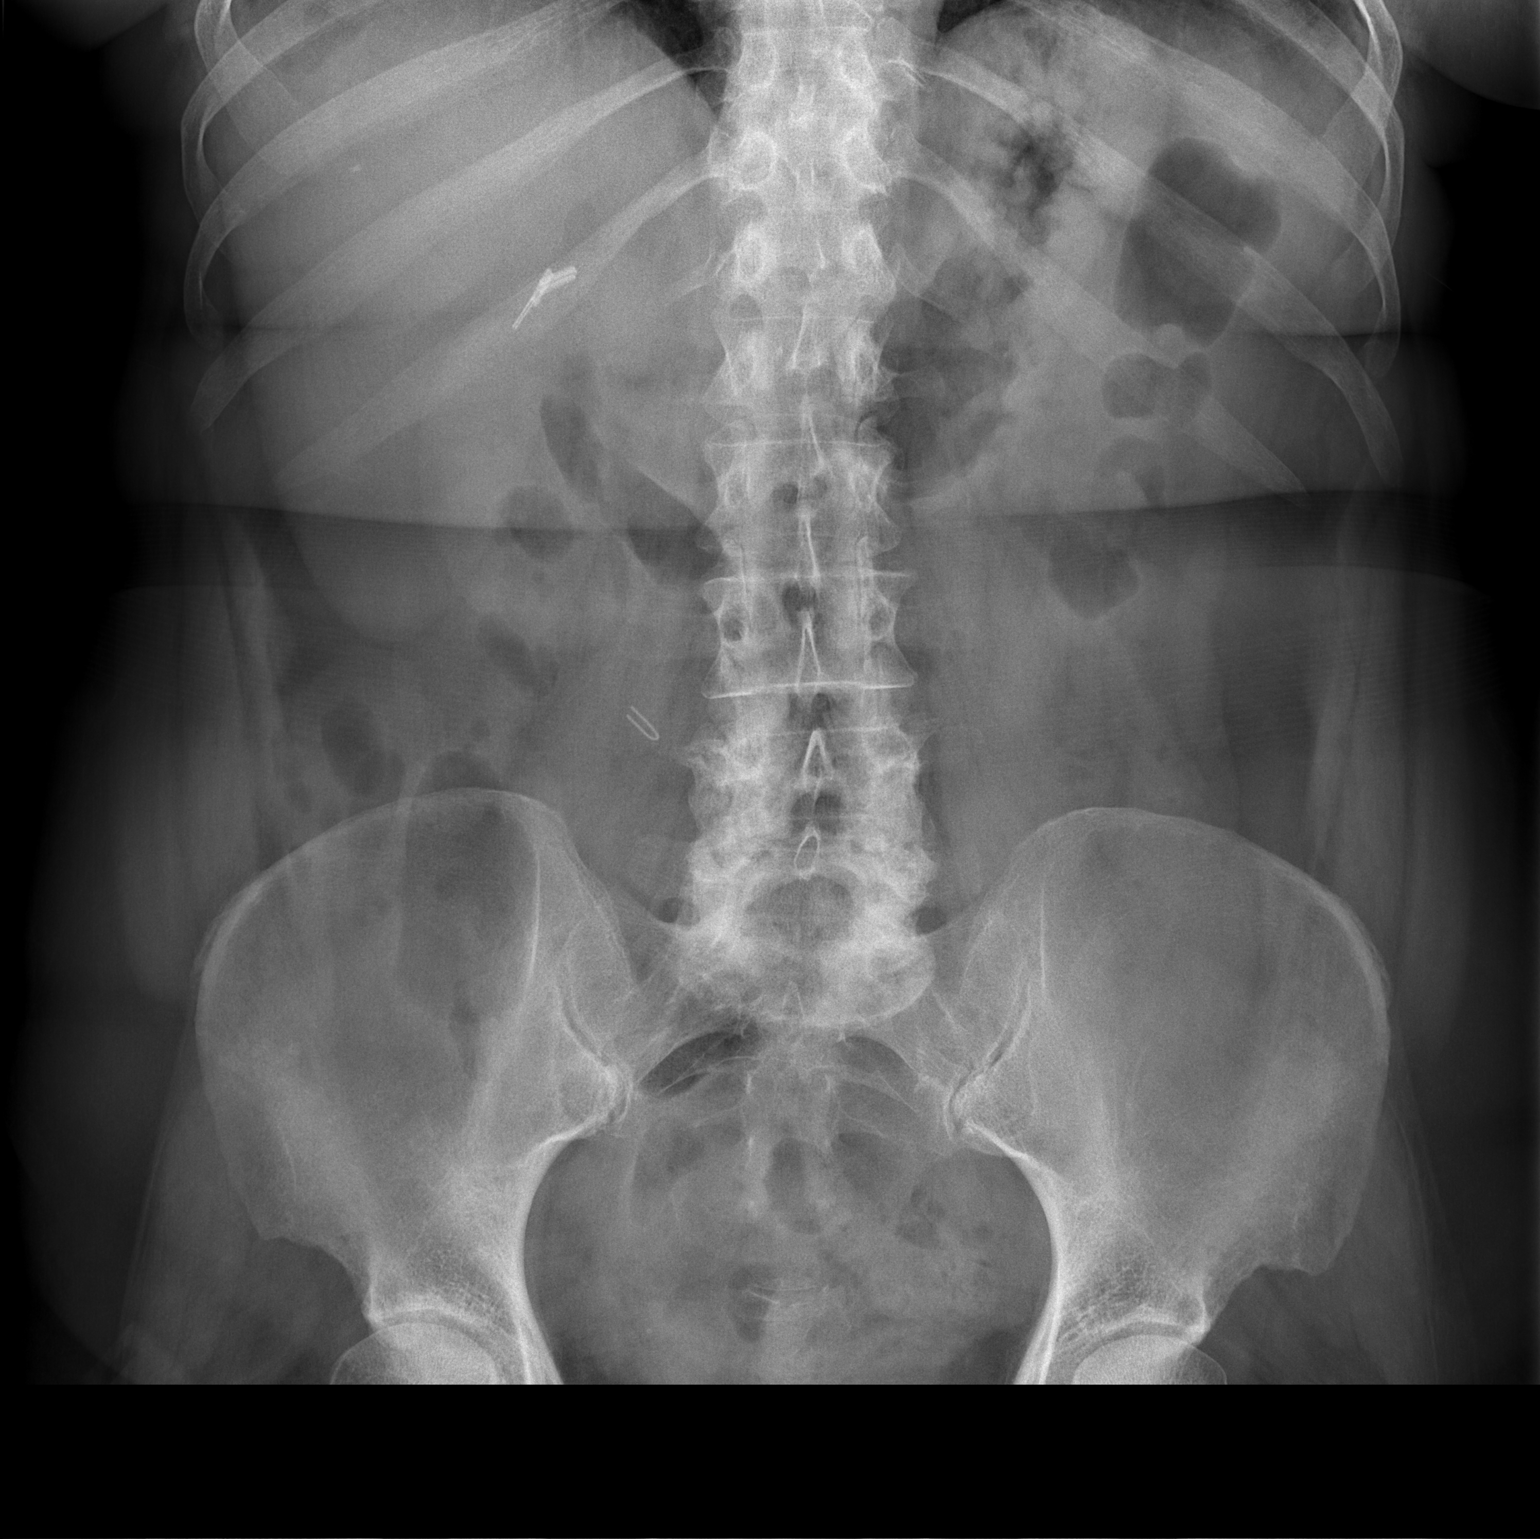

[w abdomen upright]
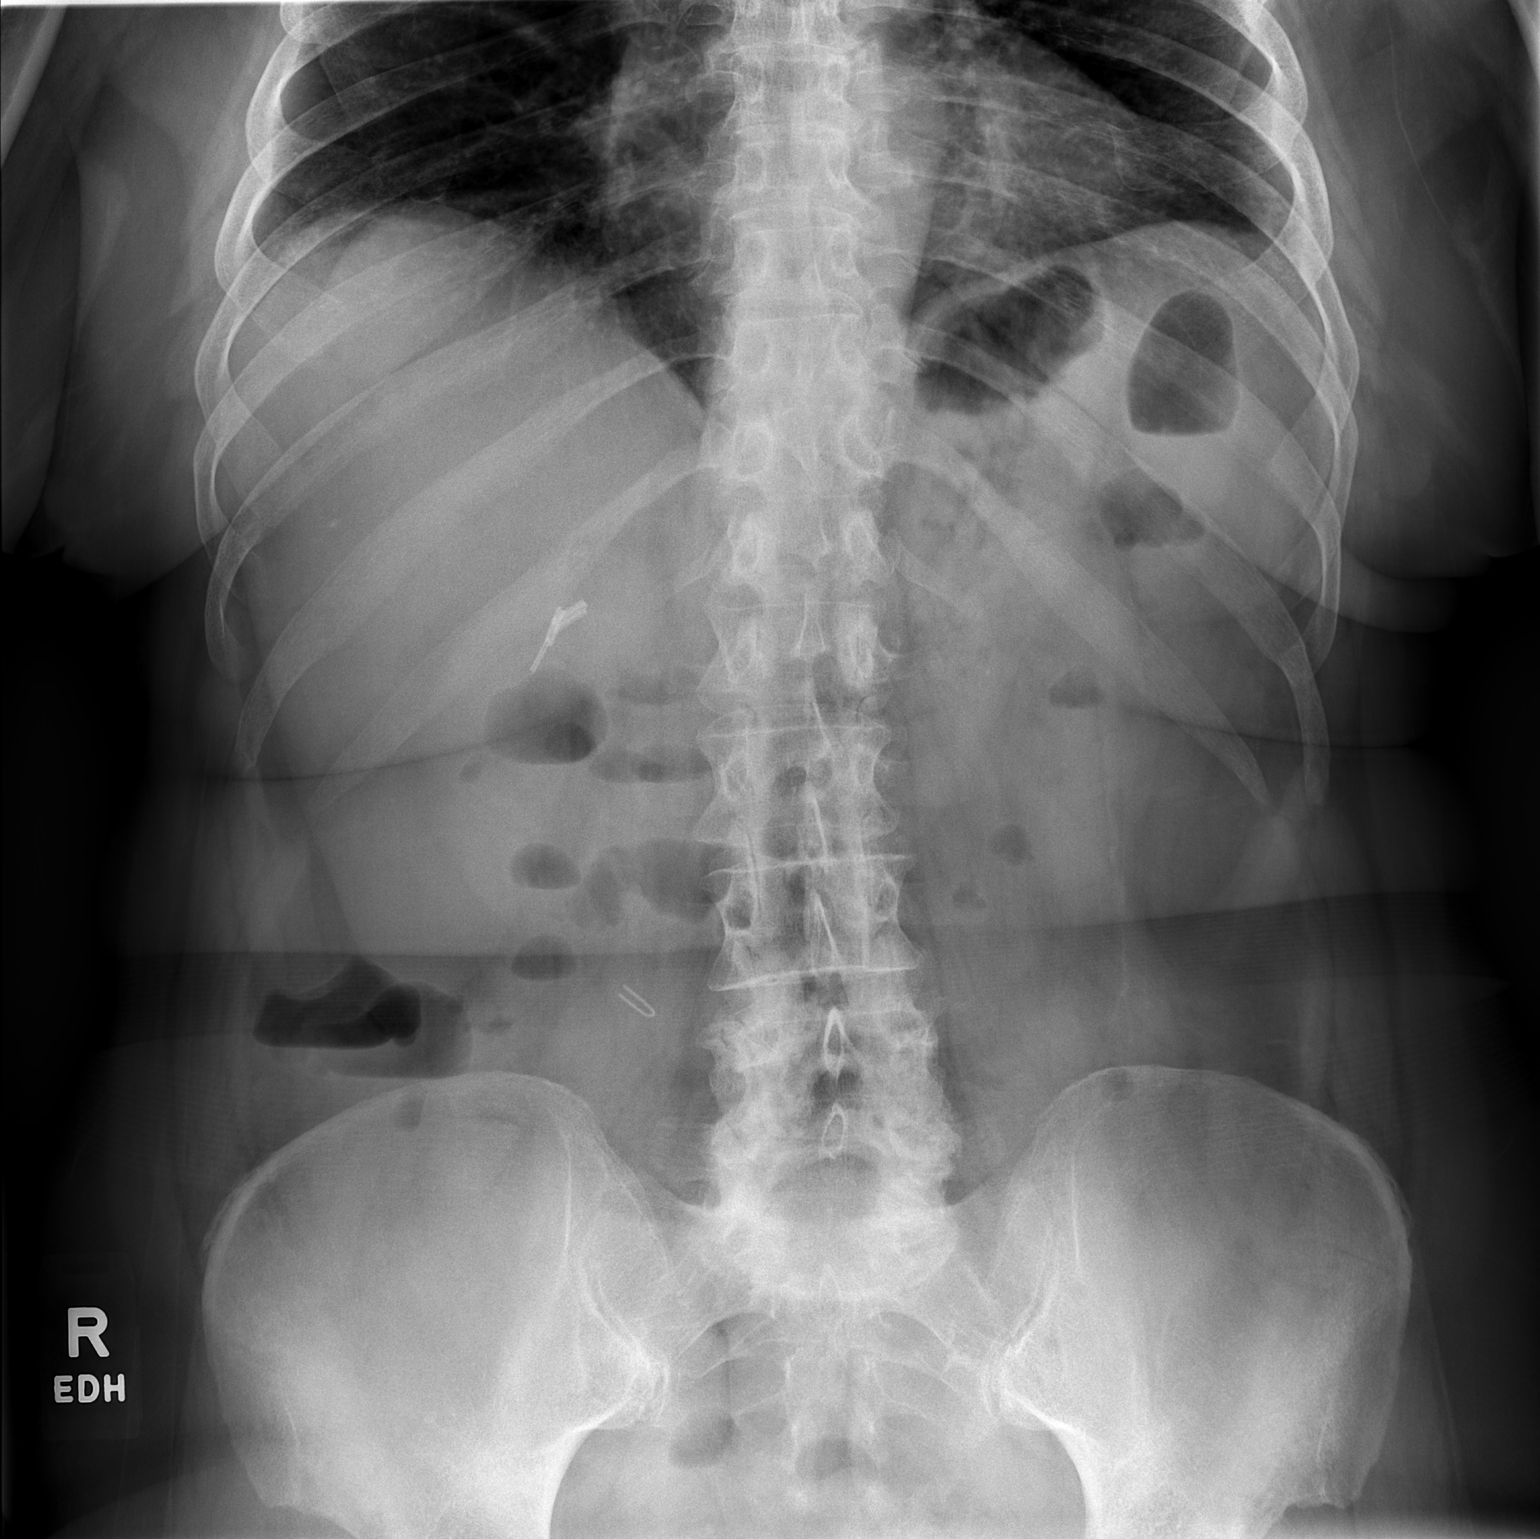

[2 of 2 positions shown; findings below may reference images not displayed]

FINDINGS: Cholecystectomy clips. Gas is seen within nondilated bowel loops.
Mild stool burden within the distal colon. No suspicious calcific
densities. Multilevel spondylosis.
IMPRESSION: Nonobstructive bowel gas pattern.  Mild stool burden.

## 2021-09-10 ENCOUNTER — Ambulatory Visit: Payer: Medicare Other | Admitting: Orthopedic Surgery

## 2021-09-10 ENCOUNTER — Telehealth: Payer: Self-pay

## 2021-09-10 ENCOUNTER — Ambulatory Visit (INDEPENDENT_AMBULATORY_CARE_PROVIDER_SITE_OTHER): Payer: Medicare Other

## 2021-09-10 DIAGNOSIS — G8929 Other chronic pain: Secondary | ICD-10-CM | POA: Diagnosis not present

## 2021-09-10 DIAGNOSIS — M25551 Pain in right hip: Secondary | ICD-10-CM

## 2021-09-10 DIAGNOSIS — M25562 Pain in left knee: Secondary | ICD-10-CM | POA: Diagnosis not present

## 2021-09-10 NOTE — Telephone Encounter (Signed)
The patient was told that the office will handle her medical needs for up to 30 days because she has been discharged and she was scheduled for an appt for surgical clearance.  ?

## 2021-09-10 NOTE — Telephone Encounter (Signed)
The patient wanted to know why she received a discharge letter and the patient was told that she received the letter because she wasn't returning the office calls to schedule her follow-up appts and that she has several no shows appts.  ?

## 2021-09-11 ENCOUNTER — Encounter: Payer: Self-pay | Admitting: Orthopedic Surgery

## 2021-09-11 NOTE — Progress Notes (Signed)
? ?Office Visit Note ?  ?Patient: Tara Davis           ?Date of Birth: May 29, 1946           ?MRN: [id number] ?Visit Date: 09/10/2021 ?             ?Requested by: Glendale Chard, MD ?[address] ?[address] ?[place],  [place] [address] ?PCP: Glendale Chard, MD ? ?Chief Complaint  ?Patient presents with  ? Right Hip - Follow-up  ? ? ? ? ?HPI: ?Patient is a 76 year old woman who presents with right posterior buttocks pain.  She is concerned that some of her knee pain may be associated with the hip pain. ? ?Assessment & Plan: ?Visit Diagnoses:  ?1. Pain in right hip   ?2. Chronic pain of left knee   ? ? ?Plan: We will plan to schedule for total knee arthroplasty. ? ?Follow-Up Instructions: Return in about 2 weeks (around 09/24/2021).  ? ?Ortho Exam ? ?Patient is alert, oriented, no adenopathy, well-dressed, normal affect, normal respiratory effort. ?Examination patient has internal rotation of 30 degrees which is symmetric bilaterally without pain.  There is external rotation about 45 degrees bilaterally which is also pain-free she has a negative straight leg raise no focal motor weakness. ? ?Imaging: ?No results found. ?No images are attached to the encounter. ? ?Labs: ?Lab Results  ?Component Value Date  ? HGBA1C 5.9 (H) 04/13/2018  ? LABORGA Multiple bacterial morphotypes present, none 12/17/2014  ? LABORGA predominant. Suggest appropriate recollection if 12/17/2014  ? LABORGA clinically indicated. 12/17/2014  ? ? ? ?Lab Results  ?Component Value Date  ? ALBUMIN 4.5 12/07/2018  ? ALBUMIN 4.6 04/13/2018  ? ALBUMIN 4.4 12/02/2016  ? ? ?Lab Results  ?Component Value Date  ? MG 2.1 01/07/2008  ? ?Lab Results  ?Component Value Date  ? VD25OH 31.2 04/13/2018  ? ? ?No results found for: PREALBUMIN ? ?  Latest Ref Rng & Units 12/07/2018  ?  9:22 AM 04/13/2018  ? 12:52 PM 08/22/2015  ? 12:34 PM  ?CBC EXTENDED  ?WBC 3.4 - 10.8 x10E3/uL 9.7   8.7   9.2    ?RBC 3.77 - 5.28 x10E6/uL 4.34   4.38   4.54    ?Hemoglobin 11.1 - 15.9 g/dL  14.5   14.6   15.1    ?HCT 34.0 - 46.6 % 41.9   42.2   44.1    ?Platelets 150 - 450 x10E3/uL 299   289   323.0    ?NEUT# 1.4 - 7.0 x10E3/uL 6.8    5.5    ?Lymph# 0.7 - 3.1 x10E3/uL 2.1    3.1    ? ? ? ?There is no height or weight on file to calculate BMI. ? ?Orders:  ?Orders Placed This Encounter  ?Procedures  ? XR HIP UNILAT W OR W/O PELVIS 2-3 VIEWS RIGHT  ? ?No orders of the defined types were placed in this encounter. ? ? ? Procedures: ?No procedures performed ? ?Clinical Data: ?No additional findings. ? ?ROS: ? ?All other systems negative, except as noted in the HPI. ?Review of Systems ? ?Objective: ?Vital Signs: There were no vitals taken for this visit. ? ?Specialty Comments:  ?No specialty comments available. ? ?PMFS History: ?Patient Active Problem List  ? Diagnosis Date Noted  ? Personal history of colonic polyps 08/07/2020  ? Gastroesophageal reflux disease 08/23/2019  ? Tobacco abuse 01/12/2018  ? Cigarette smoker 01/12/2018  ? IBS 07/05/2008  ? ?Past Medical History:  ?  Diagnosis Date  ? Adenomatous colon polyp   ? Anxiety   ? Bilateral cataracts   ? within 6 mos.  ? Depression   ? Diverticulosis   ? Fatty liver   ? GERD (gastroesophageal reflux disease)   ? H. pylori infection   ? Hiatal hernia   ? Hyperlipidemia   ? Ulcer 1988  ? Uterine fibroid   ?  ?Family History  ?Problem Relation Age of Onset  ? Alzheimer's disease Mother   ? Depression Father   ? Diabetes Father   ? Breast cancer Sister   ? Colon cancer Neg Hx   ? Esophageal cancer Neg Hx   ? Stomach cancer Neg Hx   ?  ?Past Surgical History:  ?Procedure Laterality Date  ? APPENDECTOMY    ? CATARACT EXTRACTION, BILATERAL    ? CHOLECYSTECTOMY    ? COLONOSCOPY    ? spontaneous vaginal delivery    ? x2  ? TONSILLECTOMY    ? WISDOM TOOTH EXTRACTION    ? ?Social History  ? ?Occupational History  ? 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 Smoking status: Every Day  ?  Packs/day: 0.50  ?  Years: 53.00  ?  Pack years: 26.50  ?   Types: Cigarettes  ? Smokeless tobacco: Never  ?Vaping Use  ? Vaping Use: Never used  ?Substance and Sexual Activity  ? Alcohol use: Yes  ?  Alcohol/week: 10.0 standard drinks  ?  Types: 10 Shots of liquor per week  ? Drug use: No  ? Sexual activity: Not Currently  ? ? ? ? ? ?

## 2021-09-30 ENCOUNTER — Other Ambulatory Visit (HOSPITAL_COMMUNITY): Payer: Self-pay | Admitting: Psychiatry

## 2021-10-03 ENCOUNTER — Ambulatory Visit (INDEPENDENT_AMBULATORY_CARE_PROVIDER_SITE_OTHER): Payer: Medicare Other | Admitting: Nurse Practitioner

## 2021-10-03 ENCOUNTER — Encounter: Payer: Self-pay | Admitting: Nurse Practitioner

## 2021-10-03 VITALS — BP 128/62 | HR 78 | Temp 98.1°F | Ht 62.5 in | Wt 204.0 lb

## 2021-10-03 DIAGNOSIS — R7303 Prediabetes: Secondary | ICD-10-CM

## 2021-10-03 DIAGNOSIS — Z01810 Encounter for preprocedural cardiovascular examination: Secondary | ICD-10-CM

## 2021-10-03 DIAGNOSIS — Z23 Encounter for immunization: Secondary | ICD-10-CM | POA: Diagnosis not present

## 2021-10-03 NOTE — Progress Notes (Signed)
I,Tianna Badgett,acting as a Education administrator for [employer], FNP.,have documented all relevant documentation on the behalf of Minette Brine, FNP,as directed by  Minette Brine, FNP while in the presence of Minette Brine, [place].  This visit occurred during the SARS-CoV-2 public health emergency.  Safety protocols were in place, including screening questions prior to the visit, additional usage of staff PPE, and extensive cleaning of exam room while observing appropriate contact time as indicated for disinfecting solutions.  Subjective:     Patient ID: Tara Davis , female    DOB: [birth year] , 76 y.o.   MRN: [id number]   Chief Complaint  Patient presents with   Pre-op Exam    HPI  Patient presents for surgical clearance for knee surgery. She is scheduled to have her left knee replaced, with Dr Sharol Given.      Past Medical History:  Diagnosis Date   Adenomatous colon polyp    Anxiety    Arthritis    Bilateral cataracts    within 6 mos.   Depression    Diverticulosis    Fatty liver    GERD (gastroesophageal reflux disease)    H. pylori infection    Hyperlipidemia    Ulcer 1988   Uterine fibroid      Family History  Problem Relation Age of Onset   Alzheimer's disease Mother    Depression Father    Diabetes Father    Breast cancer Sister    Colon cancer Neg Hx    Esophageal cancer Neg Hx    Stomach cancer Neg Hx      Current Outpatient Medications:    ARIPiprazole (ABILIFY) 2 MG tablet, Take 4 mg by mouth daily., Disp: , Rfl:    atorvastatin (LIPITOR) 20 MG tablet, TAKE ONE TABLET BY MOUTH DAILY, Disp: 90 tablet, Rfl: 1   Bacillus Coagulans-Inulin (BENEFIBER PREBIOTIC+PROBIOTIC PO), Take 1 Scoop by mouth daily., Disp: , Rfl:    dicyclomine (BENTYL) 10 MG capsule, Take 10 mg by mouth in the morning and at bedtime., Disp: , Rfl:    HYDROcodone-acetaminophen (NORCO/VICODIN) 5-325 MG tablet, Take 1 tablet by mouth every 6 (six) hours as needed for moderate pain., Disp: 30 tablet, Rfl: 0    hyoscyamine (LEVSIN) 0.125 MG tablet, Take 0.125 mg by mouth every 4 (four) hours as needed for cramping., Disp: , Rfl:    loperamide (IMODIUM A-D) 2 MG tablet, Take 2 mg by mouth 4 (four) times daily as needed for diarrhea or loose stools., Disp: , Rfl:    LORazepam (ATIVAN) 0.5 MG tablet, Take 1 mg by mouth 2 (two) times daily. , Disp: , Rfl:    Na Sulfate-K Sulfate-Mg Sulf (SUPREP BOWEL PREP KIT) 17.5-3.13-1.6 GM/177ML SOLN, Take 1 kit by mouth as directed., Disp: 324 mL, Rfl: 0   nicotine (NICODERM CQ - DOSED IN MG/24 HOURS) 21 mg/24hr patch, Place 21 mg onto the skin daily as needed (nicotine)., Disp: , Rfl:    omeprazole (PRILOSEC) 40 MG capsule, Take 40 mg by mouth 2 (two) times daily before a meal., Disp: , Rfl:    ondansetron (ZOFRAN) 4 MG tablet, Take 4 mg by mouth every 8 (eight) hours as needed for nausea or vomiting., Disp: , Rfl:    oxyCODONE-acetaminophen (PERCOCET/ROXICET) 5-325 MG tablet, Take 1 tablet by mouth every 4 (four) hours as needed., Disp: 30 tablet, Rfl: 0   vortioxetine HBr (TRINTELLIX) 20 MG TABS tablet, Take 20 mg by mouth daily., Disp: , Rfl:    Allergies  Allergen Reactions  Amoxicillin-Pot Clavulanate Other (See Comments)   Codeine Other (See Comments)    REACTION: hallucinations   Penicillins Itching     Review of Systems  Constitutional: Negative.   Respiratory: Negative.    Cardiovascular: Negative.   Gastrointestinal: Negative.   Neurological: Negative.     Today's Vitals   10/03/21 1445  BP: 128/62  Pulse: 78  Temp: 98.1 F (36.7 C)  TempSrc: Oral  Weight: 204 lb (92.5 kg)  Height: 5' 2.5" (1.588 m)   Body mass index is 36.72 kg/m.   Objective:  Physical Exam Vitals reviewed.  Constitutional:      General: She is not in acute distress.    Appearance: Normal appearance. She is obese.  Cardiovascular:     Rate and Rhythm: Normal rate and regular rhythm.     Pulses: Normal pulses.     Heart sounds: Normal heart sounds. No murmur  heard. Pulmonary:     Effort: Pulmonary effort is normal. No respiratory distress.     Breath sounds: Normal breath sounds. No wheezing.  Skin:    Capillary Refill: Capillary refill takes less than 2 seconds.  Neurological:     General: No focal deficit present.     Mental Status: She is alert and oriented to person, place, and time.     Cranial Nerves: No cranial nerve deficit.     Motor: No weakness.  Psychiatric:        Mood and Affect: Mood normal.        Behavior: Behavior normal.        Thought Content: Thought content normal.        Judgment: Judgment normal.        Assessment And Plan:     1. Pre-operative cardiovascular examination Comments: EKG done with NSR HR 75. Awaiting medical clearance form from Dr. Jess Barters office. As long as labs are normal she is cleared to have surgery.  - EKG 12-Lead - CBC - CMP14+EGFR - Protime-INR  2. Prediabetes Comments: HgbA1c was 5.9, diet controlled. Will recheck levels since it has been more than 2 years. - Hemoglobin A1c  3. Need for Tdap vaccination Will give tetanus vaccine today while in office. Refer to order management. TDAP will be administered to adults 75-45 years old every 10 years. - Tdap vaccine greater than or equal to 75yo IM   Patient was given opportunity to ask questions. Patient verbalized understanding of the plan and was able to repeat key elements of the plan. All questions were answered to their satisfaction.  Minette Brine, FNP   I, Minette Brine, FNP, have reviewed all documentation for this visit. The documentation on 10/03/21 for the exam, diagnosis, procedures, and orders are all accurate and complete.   IF YOU HAVE BEEN REFERRED TO A SPECIALIST, IT MAY TAKE 1-2 WEEKS TO SCHEDULE/PROCESS THE REFERRAL. IF YOU HAVE NOT HEARD FROM US/SPECIALIST IN TWO WEEKS, PLEASE GIVE [place] A CALL AT [phone].   THE PATIENT IS ENCOURAGED TO PRACTICE SOCIAL DISTANCING DUE TO THE COVID-19 PANDEMIC.

## 2021-10-03 NOTE — Progress Notes (Addendum)
Surgical Instructions ? ? ? Your procedure is scheduled on Friday, 10/11/21. ? Report to [facility] Main Entrance "A" at 5:30 A.M., then check in with the Admitting office. ? Call this number if you have problems the morning of surgery: ? [phone] ? ? If you have any questions prior to your surgery date call [phone]: Open Monday-Friday 8am-4pm ? ? ? Remember: ? Do not eat after midnight the night before your surgery ? ?You may drink clear liquids until 4:30am the morning of your surgery.   ?Clear liquids allowed are: Water, Non-Citrus Juices (without pulp), Carbonated Beverages, Clear Tea, Black Coffee ONLY (NO MILK, CREAM OR POWDERED CREAMER of any kind), and Gatorade ?  ? Take these medicines the morning of surgery with A SIP OF WATER:  ?ARIPiprazole (ABILIFY)  ?atorvastatin (LIPITOR) ?dicyclomine (BENTYL) ?LORazepam (ATIVAN) ?omeprazole (PRILOSEC) ?vortioxetine HBr (TRINTELLIX)  ? ?IF NEEDED: ?hyoscyamine (LEVSIN) ?loperamide (IMODIUM A-D) ?ondansetron [facility]) ? ?As of today, STOP taking any Aspirin (unless otherwise instructed by your surgeon) Aleve, Naproxen, Ibuprofen, Motrin, Advil, Goody's, BC's, all herbal medications, fish oil, and all vitamins. ? ?         ?Do not wear jewelry or makeup ?Do not wear lotions, powders, perfumes/colognes, or deodorant. ?Do not shave 48 hours prior to surgery.   ?Do not bring valuables to the hospital. ?Do not wear nail polish, gel polish, artificial nails, or any other type of covering on natural nails (fingers and toes) ?If you have artificial nails or gel coating that need to be removed by a nail salon, please have this removed prior to surgery. Artificial nails or gel coating may interfere with anesthesia's ability to adequately monitor your vital signs. ? ?[facility] is not responsible for any belongings or valuables. .  ? ?Do NOT Smoke (Tobacco/Vaping)  24 hours prior to your procedure ? ?If you use a CPAP at night, you may bring your mask for your overnight  stay. ?  ?Contacts, glasses, hearing aids, dentures or partials may not be worn into surgery, please bring cases for these belongings ?  ?For patients admitted to the hospital, discharge time will be determined by your treatment team. ?  ?Patients discharged the day of surgery will not be allowed to drive home, and someone needs to stay with them for 24 hours. ? ? ?SURGICAL WAITING ROOM VISITATION ?Patients having surgery or a procedure in a hospital may have two support people. ?Children under the age of 4 must have an adult with them who is not the patient. ?They may stay in the waiting area during the procedure and may switch out with other visitors. If the patient needs to stay at the hospital during part of their recovery, the visitor guidelines for inpatient rooms apply. ? ?Please refer to the [facility] website for the visitor guidelines for Inpatients (after your surgery is over and you are in a regular room).  ? ? ? ? ? ?Special instructions:   ? ?Oral Hygiene is also important to reduce your risk of infection.  Remember - BRUSH YOUR TEETH THE MORNING OF SURGERY WITH YOUR REGULAR TOOTHPASTE ? ? ?[facility] [place]- Preparing For Surgery ? ?Before surgery, you can play an important role. Because skin is not sterile, your skin needs to be as free of germs as possible. You can reduce the number of germs on your skin by washing with CHG (chlorahexidine gluconate) Soap before surgery.  CHG is an antiseptic cleaner which kills germs and bonds with the skin to continue killing  germs even after washing.   ? ? ?Please do not use if you have an allergy to CHG or antibacterial soaps. If your skin becomes reddened/irritated stop using the CHG.  ?Do not shave (including legs and underarms) for at least 48 hours prior to first CHG shower. It is OK to shave your face. ? ?Please follow these instructions carefully. ?  ? ? Shower the NIGHT BEFORE SURGERY and the MORNING OF SURGERY with CHG Soap.  ? If you chose to wash your hair,  wash your hair first as usual with your normal shampoo. After you shampoo, rinse your hair and body thoroughly to remove the shampoo.  Then [employer] and genitals (private parts) with your normal soap and rinse thoroughly to remove soap. ? ?After that Use CHG Soap as you would any other liquid soap. You can apply CHG directly to the skin and wash gently with a scrungie or a clean washcloth.  ? ?Apply the CHG Soap to your body ONLY FROM THE NECK DOWN.  Do not use on open wounds or open sores. Avoid contact with your eyes, ears, mouth and genitals (private parts). Wash Face and genitals (private parts)  with your normal soap.  ? ?Wash thoroughly, paying special attention to the area where your surgery will be performed. ? ?Thoroughly rinse your body with warm water from the neck down. ? ?DO NOT shower/wash with your normal soap after using and rinsing off the CHG Soap. ? ?Pat yourself dry with a CLEAN TOWEL. ? ?Wear CLEAN PAJAMAS to bed the night before surgery ? ?Place CLEAN SHEETS on your bed the night before your surgery ? ?DO NOT SLEEP WITH PETS. ? ? ?Day of Surgery: ?Take a shower with CHG soap. ?Wear Clean/Comfortable clothing the morning of surgery ?Do not apply any deodorants/lotions.   ?Remember to brush your teeth WITH YOUR REGULAR TOOTHPASTE. ? ? ? ?If you received a COVID test during your pre-op visit, it is requested that you wear a mask when out in public, stay away from anyone that may not be feeling well, and notify your surgeon if you develop symptoms. If you have been in contact with anyone that has tested positive in the last 10 days, please notify your surgeon. ? ?  ?Please read over the following fact sheets that you were given.  ? ?

## 2021-10-04 ENCOUNTER — Encounter (HOSPITAL_COMMUNITY): Payer: Self-pay

## 2021-10-04 ENCOUNTER — Encounter (HOSPITAL_COMMUNITY)
Admission: RE | Admit: 2021-10-04 | Discharge: 2021-10-04 | Disposition: A | Discharge status: Home / Self Care | Payer: Medicare Other | Source: Ambulatory Visit | Attending: Orthopedic Surgery | Admitting: Orthopedic Surgery

## 2021-10-04 ENCOUNTER — Other Ambulatory Visit: Payer: Self-pay

## 2021-10-04 VITALS — BP 152/74 | HR 79 | Temp 98.1°F | Resp 17 | Ht 63.0 in | Wt 205.6 lb

## 2021-10-04 DIAGNOSIS — Z01818 Encounter for other preprocedural examination: Secondary | ICD-10-CM

## 2021-10-04 DIAGNOSIS — Z01812 Encounter for preprocedural laboratory examination: Secondary | ICD-10-CM | POA: Insufficient documentation

## 2021-10-04 HISTORY — DX: Unspecified osteoarthritis, unspecified site: M19.90

## 2021-10-04 LAB — CMP14+EGFR
ALT: 58 IU/L — ABNORMAL HIGH (ref 0–32)
AST: 28 IU/L (ref 0–40)
Albumin/Globulin Ratio: 2.1 (ref 1.2–2.2)
Albumin: 4.4 g/dL (ref 3.7–4.7)
Alkaline Phosphatase: 124 IU/L — ABNORMAL HIGH (ref 44–121)
BUN/Creatinine Ratio: 18 (ref 12–28)
BUN: 17 mg/dL (ref 8–27)
Bilirubin Total: <0.2 mg/dL (ref 0.0–1.2)
CO2: 21 mmol/L (ref 20–29)
Calcium: 9.2 mg/dL (ref 8.7–10.3)
Chloride: 106 mmol/L (ref 96–106)
Creatinine, Ser: 0.93 mg/dL (ref 0.57–1.00)
Globulin, Total: 2.1 g/dL (ref 1.5–4.5)
Glucose: 99 mg/dL (ref 70–99)
Potassium: 4.1 mmol/L (ref 3.5–5.2)
Sodium: 144 mmol/L (ref 134–144)
Total Protein: 6.5 g/dL (ref 6.0–8.5)
eGFR: 64 mL/min/{1.73_m2} (ref >59)

## 2021-10-04 LAB — HEMOGLOBIN A1C
Est. average glucose Bld gHb Est-mCnc: 131 mg/dL
Hgb A1c MFr Bld: 6.2 % — ABNORMAL HIGH (ref 4.8–5.6)

## 2021-10-04 LAB — SURGICAL PCR SCREEN
MRSA, PCR: NEGATIVE
Staphylococcus aureus: NEGATIVE

## 2021-10-04 LAB — PROTIME-INR
INR: 1 (ref 0.9–1.2)
Prothrombin Time: 10.3 s (ref 9.1–12.0)

## 2021-10-04 LAB — CBC
Hematocrit: 38.2 % (ref 34.0–46.6)
Hemoglobin: 13.7 g/dL (ref 11.1–15.9)
MCH: 34.9 pg — ABNORMAL HIGH (ref 26.6–33.0)
MCHC: 35.9 g/dL — ABNORMAL HIGH (ref 31.5–35.7)
MCV: 97 fL (ref 79–97)
Platelets: 288 10*3/uL (ref 150–450)
RBC: 3.93 x10E6/uL (ref 3.77–5.28)
RDW: 12.1 % (ref 11.7–15.4)
WBC: 9.8 10*3/uL (ref 3.4–10.8)

## 2021-10-04 NOTE — Progress Notes (Addendum)
PCP - Dr. Glendale Chard, [facility] ?Cardiologist - denies ? ?PPM/ICD - n/a ?Device Orders - n/a ?Rep Notified - n/a ? ?Chest x-ray - n/a ?EKG - 10/03/21 ?Stress Test - denies ?ECHO - Over 10 years ago per patient. Normal per patient.  ?Cardiac Cath - denies ? ?Sleep Study - denies ?CPAP - n/a ? ?Fasting Blood Sugar - n/a ?Checks Blood Sugar _____ times a day- n/a ? ?Blood Thinner Instructions: n/a ?Aspirin Instructions: n/a ? ?ERAS Protcol - Clear liquids until 0430 day of surgery ?PRE-SURGERY Ensure or G2- N/A ? ?COVID TEST- N/A ? ? ?Anesthesia review: Yes. Note from Othelia Pulling, NP for clearance. Labs/EKG done at visit on 10/03/21 with Othelia Pulling, NP ? ?Patient denies shortness of breath, fever, cough and chest pain at PAT appointment ? ? ?All instructions explained to the patient, with a verbal understanding of the material. Patient agrees to go over the instructions while at home for a better understanding. Patient also instructed to self quarantine after being tested for COVID-19. The opportunity to ask questions was provided. ? ? ?

## 2021-10-08 ENCOUNTER — Telehealth: Payer: Self-pay | Admitting: Orthopedic Surgery

## 2021-10-08 NOTE — Telephone Encounter (Signed)
Please call pt she has questions regarding surgery  ? ?Please call [phone] ?

## 2021-10-08 NOTE — Telephone Encounter (Signed)
SW pt, she had a couple questions that I couldn't answer for her: ? ?Does she get to choose on anesthesia? General Vs. Spinal? ? ?She is a smoker and asking if she can wear a patch in the hospital after her surgery? ?

## 2021-10-09 DIAGNOSIS — M1712 Unilateral primary osteoarthritis, left knee: Secondary | ICD-10-CM | POA: Diagnosis not present

## 2021-10-09 NOTE — Telephone Encounter (Signed)
Order was accepted, but pt declined. She wants a bed side commode. Will put in another order for this. ?

## 2021-10-09 NOTE — Telephone Encounter (Signed)
Pt informed. She would like for [place] to send in an order for a raised toilet seat. I will put in an order through [facility] online. ?

## 2021-10-09 NOTE — Telephone Encounter (Signed)
Order submitted for raised toilet seat with arms. Pending approval ?

## 2021-10-10 MED ORDER — TRANEXAMIC ACID 1000 MG/10ML IV SOLN
2000.0000 mg | INTRAVENOUS | Status: AC
  Administered 2021-10-11: 1000 mg via TOPICAL
  Filled 2021-10-10: qty 20

## 2021-10-10 NOTE — Anesthesia Preprocedure Evaluation (Addendum)
Anesthesia Evaluation  ?Patient identified by MRN, date of birth, ID band ?Patient awake ? ? ? ?Reviewed: ?Allergy & Precautions, NPO status , Patient's Chart, lab work & pertinent test results ? ?History of Anesthesia Complications ?Negative for: history of anesthetic complications ? ?Airway ?Mallampati: II ? ?TM Distance: >3 FB ?Neck ROM: Full ? ? ? Dental ? ?(+) Missing,  ?  ?Pulmonary ?Current Smoker and Patient abstained from smoking.,  ?  ?Pulmonary exam normal ? ? ? ? ? ? ? Cardiovascular ?negative cardio ROS ?Normal cardiovascular exam ? ? ?  ?Neuro/Psych ?Anxiety Depression negative neurological ROS ?   ? GI/Hepatic ?Neg liver ROS, GERD  Controlled,  ?Endo/Other  ?negative endocrine ROS ? Renal/GU ?negative Renal ROS  ?negative genitourinary ?  ?Musculoskeletal ? ?(+) Arthritis ,  ? Abdominal ?  ?Peds ? Hematology ?negative hematology ROS ?(+)   ?Anesthesia Other Findings ?Day of surgery medications reviewed with patient. ? Reproductive/Obstetrics ?negative OB ROS ? ?  ? ? ? ? ? ? ? ? ? ? ? ? ? ?  ?  ? ? ? ? ? ? ?Anesthesia Physical ?Anesthesia Plan ? ?ASA: 2 ? ?Anesthesia Plan: Spinal  ? ?Post-op Pain Management: Tylenol PO (pre-op)* and Regional block*  ? ?Induction:  ? ?PONV Risk Score and Plan: 3 and Treatment may vary due to age or medical condition, Ondansetron, Propofol infusion and Dexamethasone ? ?Airway Management Planned: Natural Airway and Simple Face Mask ? ?Additional Equipment: None ? ?Intra-op Plan:  ? ?Post-operative Plan:  ? ?Informed Consent: I have reviewed the patients History and Physical, chart, labs and discussed the procedure including the risks, benefits and alternatives for the proposed anesthesia with the patient or authorized representative who has indicated his/her understanding and acceptance.  ? ? ? ? ? ?Plan Discussed with: CRNA ? ?Anesthesia Plan Comments:   ? ? ? ? ? ?Anesthesia Quick Evaluation ? ?

## 2021-10-10 NOTE — Telephone Encounter (Signed)
Pt got her BSC delivered by [facility]. ?

## 2021-10-11 ENCOUNTER — Other Ambulatory Visit: Payer: Self-pay

## 2021-10-11 ENCOUNTER — Ambulatory Visit (HOSPITAL_COMMUNITY): Payer: Medicare Other | Admitting: Emergency Medicine

## 2021-10-11 ENCOUNTER — Observation Stay (HOSPITAL_COMMUNITY)
Admission: RE | Admit: 2021-10-11 | Discharge: 2021-10-12 | Disposition: A | Discharge status: Home Health | Payer: Medicare Other | Attending: Orthopedic Surgery | Admitting: Orthopedic Surgery

## 2021-10-11 ENCOUNTER — Encounter (HOSPITAL_COMMUNITY)
Admission: RE | Disposition: A | Discharge status: Home Health | Payer: Self-pay | Source: Home / Self Care | Attending: Orthopedic Surgery

## 2021-10-11 ENCOUNTER — Ambulatory Visit (HOSPITAL_BASED_OUTPATIENT_CLINIC_OR_DEPARTMENT_OTHER): Payer: Medicare Other | Admitting: Anesthesiology

## 2021-10-11 ENCOUNTER — Encounter (HOSPITAL_COMMUNITY): Payer: Self-pay | Admitting: Orthopedic Surgery

## 2021-10-11 DIAGNOSIS — E785 Hyperlipidemia, unspecified: Secondary | ICD-10-CM | POA: Diagnosis not present

## 2021-10-11 DIAGNOSIS — Z96652 Presence of left artificial knee joint: Secondary | ICD-10-CM

## 2021-10-11 DIAGNOSIS — G8918 Other acute postprocedural pain: Secondary | ICD-10-CM | POA: Diagnosis not present

## 2021-10-11 DIAGNOSIS — M1712 Unilateral primary osteoarthritis, left knee: Secondary | ICD-10-CM | POA: Diagnosis not present

## 2021-10-11 DIAGNOSIS — F17210 Nicotine dependence, cigarettes, uncomplicated: Secondary | ICD-10-CM | POA: Diagnosis not present

## 2021-10-11 DIAGNOSIS — Z79899 Other long term (current) drug therapy: Secondary | ICD-10-CM | POA: Insufficient documentation

## 2021-10-11 HISTORY — PX: TOTAL KNEE ARTHROPLASTY: SHX125

## 2021-10-11 LAB — GLUCOSE, CAPILLARY: Glucose-Capillary: 171 mg/dL — ABNORMAL HIGH (ref 70–99)

## 2021-10-11 SURGERY — ARTHROPLASTY, KNEE, TOTAL
Anesthesia: Spinal | Site: Knee | Laterality: Left

## 2021-10-11 MED ORDER — LIDOCAINE 2% (20 MG/ML) 5 ML SYRINGE
INTRAMUSCULAR | Status: AC
  Filled 2021-10-11: qty 5

## 2021-10-11 MED ORDER — PHENYLEPHRINE HCL-NACL 20-0.9 MG/250ML-% IV SOLN
INTRAVENOUS | Status: DC | PRN
  Administered 2021-10-11: 50 ug/min via INTRAVENOUS

## 2021-10-11 MED ORDER — ONDANSETRON HCL 4 MG/2ML IJ SOLN
INTRAMUSCULAR | Status: DC | PRN
  Administered 2021-10-11: 4 mg via INTRAVENOUS

## 2021-10-11 MED ORDER — METOCLOPRAMIDE HCL 5 MG PO TABS: 5.0000 mg | ORAL_TABLET | Freq: Three times a day (TID) | ORAL | Status: DC | PRN

## 2021-10-11 MED ORDER — ACETAMINOPHEN 500 MG PO TABS
1000.0000 mg | ORAL_TABLET | Freq: Once | ORAL | Status: AC
  Administered 2021-10-11: 1000 mg via ORAL
  Filled 2021-10-11: qty 2

## 2021-10-11 MED ORDER — MIDAZOLAM HCL 2 MG/2ML IJ SOLN
INTRAMUSCULAR | Status: AC
  Filled 2021-10-11: qty 2

## 2021-10-11 MED ORDER — CEFAZOLIN SODIUM-DEXTROSE 2-4 GM/100ML-% IV SOLN
INTRAVENOUS | Status: AC
Start: 2021-10-11 — End: 2021-10-11
  Filled 2021-10-11: qty 100

## 2021-10-11 MED ORDER — ONDANSETRON HCL 4 MG PO TABS: 4.0000 mg | ORAL_TABLET | Freq: Four times a day (QID) | ORAL | Status: DC | PRN

## 2021-10-11 MED ORDER — PROPOFOL 10 MG/ML IV BOLUS
INTRAVENOUS | Status: DC | PRN
  Administered 2021-10-11 (×3): 20 mg via INTRAVENOUS

## 2021-10-11 MED ORDER — FENTANYL CITRATE (PF) 250 MCG/5ML IJ SOLN
INTRAMUSCULAR | Status: AC
  Filled 2021-10-11: qty 5

## 2021-10-11 MED ORDER — BUPIVACAINE IN DEXTROSE 0.75-8.25 % IT SOLN
INTRATHECAL | Status: DC | PRN
  Administered 2021-10-11: 1.6 mL via INTRATHECAL

## 2021-10-11 MED ORDER — OXYCODONE HCL 5 MG/5ML PO SOLN: 5.0000 mg | Freq: Once | ORAL | Status: DC | PRN

## 2021-10-11 MED ORDER — MENTHOL 3 MG MT LOZG: 1.0000 | LOZENGE | OROMUCOSAL | Status: DC | PRN

## 2021-10-11 MED ORDER — TRANEXAMIC ACID 1000 MG/10ML IV SOLN
INTRAVENOUS | Status: DC | PRN
Start: 2021-10-11 — End: 2021-10-11
  Administered 2021-10-11: 2000 mg via TOPICAL

## 2021-10-11 MED ORDER — LIDOCAINE 2% (20 MG/ML) 5 ML SYRINGE
INTRAMUSCULAR | Status: DC | PRN
  Administered 2021-10-11: 40 mg via INTRAVENOUS

## 2021-10-11 MED ORDER — DICYCLOMINE HCL 10 MG PO CAPS
10.0000 mg | ORAL_CAPSULE | Freq: Three times a day (TID) | ORAL | Status: DC
  Administered 2021-10-11 – 2021-10-12 (×4): 10 mg via ORAL
  Filled 2021-10-11 (×7): qty 1

## 2021-10-11 MED ORDER — SODIUM CHLORIDE 0.9 % IV SOLN: INTRAVENOUS | Status: DC

## 2021-10-11 MED ORDER — BISACODYL 10 MG RE SUPP: 10.0000 mg | Freq: Every day | RECTAL | Status: DC | PRN

## 2021-10-11 MED ORDER — ARIPIPRAZOLE 2 MG PO TABS
4.0000 mg | ORAL_TABLET | Freq: Every day | ORAL | Status: DC
  Administered 2021-10-12: 4 mg via ORAL
  Filled 2021-10-11: qty 2

## 2021-10-11 MED ORDER — ASPIRIN EC 325 MG PO TBEC
325.0000 mg | DELAYED_RELEASE_TABLET | Freq: Every day | ORAL | Status: DC
  Administered 2021-10-12: 325 mg via ORAL
  Filled 2021-10-11: qty 1

## 2021-10-11 MED ORDER — HYOSCYAMINE SULFATE 0.125 MG PO TABS
0.1250 mg | ORAL_TABLET | ORAL | Status: DC | PRN
  Filled 2021-10-11: qty 1

## 2021-10-11 MED ORDER — DOCUSATE SODIUM 100 MG PO CAPS
100.0000 mg | ORAL_CAPSULE | Freq: Two times a day (BID) | ORAL | Status: DC
  Administered 2021-10-12: 100 mg via ORAL
  Filled 2021-10-11 (×3): qty 1

## 2021-10-11 MED ORDER — LORAZEPAM 1 MG PO TABS
1.0000 mg | ORAL_TABLET | Freq: Two times a day (BID) | ORAL | Status: DC
  Administered 2021-10-11 – 2021-10-12 (×2): 1 mg via ORAL
  Filled 2021-10-11 (×2): qty 1

## 2021-10-11 MED ORDER — VORTIOXETINE HBR 20 MG PO TABS
20.0000 mg | ORAL_TABLET | Freq: Every day | ORAL | Status: DC
  Administered 2021-10-12: 20 mg via ORAL
  Filled 2021-10-11 (×2): qty 1

## 2021-10-11 MED ORDER — CEFAZOLIN SODIUM-DEXTROSE 1-4 GM/50ML-% IV SOLN
1.0000 g | Freq: Four times a day (QID) | INTRAVENOUS | Status: AC
  Administered 2021-10-11 (×2): 1 g via INTRAVENOUS
  Filled 2021-10-11 (×2): qty 50

## 2021-10-11 MED ORDER — CLONIDINE HCL (ANALGESIA) 100 MCG/ML EP SOLN
EPIDURAL | Status: DC | PRN
  Administered 2021-10-11: 100 ug

## 2021-10-11 MED ORDER — TRANEXAMIC ACID-NACL 1000-0.7 MG/100ML-% IV SOLN
1000.0000 mg | INTRAVENOUS | Status: DC
  Filled 2021-10-11: qty 100

## 2021-10-11 MED ORDER — METOCLOPRAMIDE HCL 5 MG/ML IJ SOLN: 5.0000 mg | Freq: Three times a day (TID) | INTRAMUSCULAR | Status: DC | PRN

## 2021-10-11 MED ORDER — LACTATED RINGERS IV SOLN
INTRAVENOUS | Status: DC
  Administered 2021-10-11: 1000 mL via INTRAVENOUS

## 2021-10-11 MED ORDER — PHENOL 1.4 % MT LIQD: 1.0000 | OROMUCOSAL | Status: DC | PRN

## 2021-10-11 MED ORDER — ONDANSETRON HCL 4 MG/2ML IJ SOLN
4.0000 mg | Freq: Four times a day (QID) | INTRAMUSCULAR | Status: DC | PRN
  Administered 2021-10-11 – 2021-10-12 (×2): 4 mg via INTRAVENOUS
  Filled 2021-10-11 (×2): qty 2

## 2021-10-11 MED ORDER — BUPIVACAINE-EPINEPHRINE (PF) 0.5% -1:200000 IJ SOLN
INTRAMUSCULAR | Status: DC | PRN
  Administered 2021-10-11: 15 mL via PERINEURAL

## 2021-10-11 MED ORDER — CHLORHEXIDINE GLUCONATE 0.12 % MT SOLN
15.0000 mL | Freq: Once | OROMUCOSAL | Status: AC
  Administered 2021-10-11: 15 mL via OROMUCOSAL
  Filled 2021-10-11: qty 15

## 2021-10-11 MED ORDER — HYOSCYAMINE SULFATE 0.125 MG SL SUBL
0.1250 mg | SUBLINGUAL_TABLET | SUBLINGUAL | Status: DC | PRN
  Administered 2021-10-11: 0.125 mg via ORAL
  Filled 2021-10-11 (×2): qty 1

## 2021-10-11 MED ORDER — HYDROMORPHONE HCL 2 MG PO TABS: 2.0000 mg | ORAL_TABLET | ORAL | Status: DC | PRN

## 2021-10-11 MED ORDER — POLYETHYLENE GLYCOL 3350 17 G PO PACK: 17.0000 g | PACK | Freq: Every day | ORAL | Status: DC | PRN

## 2021-10-11 MED ORDER — CEFAZOLIN SODIUM-DEXTROSE 2-4 GM/100ML-% IV SOLN
2.0000 g | INTRAVENOUS | Status: AC
  Administered 2021-10-11: 2 g via INTRAVENOUS

## 2021-10-11 MED ORDER — 0.9 % SODIUM CHLORIDE (POUR BTL) OPTIME
TOPICAL | Status: DC | PRN
Start: 2021-10-11 — End: 2021-10-11
  Administered 2021-10-11: 1000 mL

## 2021-10-11 MED ORDER — OXYCODONE HCL 5 MG PO TABS: 5.0000 mg | ORAL_TABLET | Freq: Once | ORAL | Status: DC | PRN

## 2021-10-11 MED ORDER — SODIUM CHLORIDE 0.9 % IR SOLN
Status: DC | PRN
  Administered 2021-10-11: 2000 mL

## 2021-10-11 MED ORDER — ALBUMIN HUMAN 5 % IV SOLN
12.5000 g | Freq: Once | INTRAVENOUS | Status: AC
Start: 2021-10-11 — End: 2021-10-11
  Administered 2021-10-11: 12.5 g via INTRAVENOUS

## 2021-10-11 MED ORDER — HYDROMORPHONE HCL 1 MG/ML IJ SOLN
0.5000 mg | INTRAMUSCULAR | Status: DC | PRN
  Administered 2021-10-11: 1 mg via INTRAVENOUS
  Filled 2021-10-11: qty 1

## 2021-10-11 MED ORDER — ONDANSETRON HCL 4 MG/2ML IJ SOLN
INTRAMUSCULAR | Status: AC
  Filled 2021-10-11: qty 2

## 2021-10-11 MED ORDER — PANTOPRAZOLE SODIUM 40 MG PO TBEC
40.0000 mg | DELAYED_RELEASE_TABLET | Freq: Every day | ORAL | Status: DC
Start: 2021-10-12 — End: 2021-10-12
  Administered 2021-10-12: 40 mg via ORAL
  Filled 2021-10-11: qty 1

## 2021-10-11 MED ORDER — ACETAMINOPHEN 325 MG PO TABS
325.0000 mg | ORAL_TABLET | Freq: Four times a day (QID) | ORAL | Status: DC | PRN
  Administered 2021-10-11 – 2021-10-12 (×2): 650 mg via ORAL
  Filled 2021-10-11 (×2): qty 2

## 2021-10-11 MED ORDER — PROPOFOL 500 MG/50ML IV EMUL
INTRAVENOUS | Status: DC | PRN
  Administered 2021-10-11: 80 ug/kg/min via INTRAVENOUS

## 2021-10-11 MED ORDER — FENTANYL CITRATE (PF) 250 MCG/5ML IJ SOLN
INTRAMUSCULAR | Status: DC | PRN
  Administered 2021-10-11: 25 ug via INTRAVENOUS
  Administered 2021-10-11 (×2): 50 ug via INTRAVENOUS

## 2021-10-11 MED ORDER — ATORVASTATIN CALCIUM 10 MG PO TABS
20.0000 mg | ORAL_TABLET | Freq: Every day | ORAL | Status: DC
  Administered 2021-10-11 – 2021-10-12 (×2): 20 mg via ORAL
  Filled 2021-10-11 (×2): qty 2

## 2021-10-11 MED ORDER — FENTANYL CITRATE (PF) 100 MCG/2ML IJ SOLN: 25.0000 ug | INTRAMUSCULAR | Status: DC | PRN

## 2021-10-11 MED ORDER — ORAL CARE MOUTH RINSE: 15.0000 mL | Freq: Once | OROMUCOSAL | Status: AC

## 2021-10-11 MED ORDER — MIDAZOLAM HCL 2 MG/2ML IJ SOLN
INTRAMUSCULAR | Status: DC | PRN
  Administered 2021-10-11: .5 mg via INTRAVENOUS

## 2021-10-11 MED ORDER — ALBUMIN HUMAN 5 % IV SOLN
INTRAVENOUS | Status: AC
  Filled 2021-10-11: qty 250

## 2021-10-11 MED ORDER — OXYCODONE HCL 5 MG PO TABS
5.0000 mg | ORAL_TABLET | ORAL | Status: DC | PRN
  Administered 2021-10-11 – 2021-10-12 (×3): 10 mg via ORAL
  Filled 2021-10-11 (×3): qty 2

## 2021-10-11 SURGICAL SUPPLY — 59 items
BAG COUNTER SPONGE SURGICOUNT (BAG) ×2 IMPLANT
BLADE SAGITTAL 25.0X1.19X90 (BLADE) ×2 IMPLANT
BLADE SAW SGTL 13X75X1.27 (BLADE) ×2 IMPLANT
BLADE SURG 21 STRL SS (BLADE) ×4 IMPLANT
BNDG COHESIVE 6X5 TAN STRL LF (GAUZE/BANDAGES/DRESSINGS) ×4 IMPLANT
BNDG GAUZE ELAST 4 BULKY (GAUZE/BANDAGES/DRESSINGS) ×2 IMPLANT
BOWL SMART MIX CTS (DISPOSABLE) ×2 IMPLANT
CEMENT BONE R 1X40 (Cement) ×4 IMPLANT
COOLER ICEMAN CLASSIC (MISCELLANEOUS) ×2 IMPLANT
COVER SURGICAL LIGHT HANDLE (MISCELLANEOUS) ×2 IMPLANT
CUFF TOURN SGL QUICK 34 (TOURNIQUET CUFF) ×2
CUFF TOURN SGL QUICK 42 (TOURNIQUET CUFF) IMPLANT
CUFF TRNQT CYL 34X4.125X (TOURNIQUET CUFF) ×1 IMPLANT
DRAPE EXTREMITY T 121X128X90 (DISPOSABLE) ×2 IMPLANT
DRAPE HALF SHEET 40X57 (DRAPES) ×4 IMPLANT
DRAPE U-SHAPE 47X51 STRL (DRAPES) ×2 IMPLANT
DRSG ADAPTIC 3X8 NADH LF (GAUZE/BANDAGES/DRESSINGS) ×2 IMPLANT
DRSG PAD ABDOMINAL 8X10 ST (GAUZE/BANDAGES/DRESSINGS) ×2 IMPLANT
DURAPREP 26ML APPLICATOR (WOUND CARE) ×2 IMPLANT
ELECT REM PT RETURN 9FT ADLT (ELECTROSURGICAL) ×2
ELECTRODE REM PT RTRN 9FT ADLT (ELECTROSURGICAL) ×1 IMPLANT
FACESHIELD WRAPAROUND (MASK) ×2 IMPLANT
FACESHIELD WRAPAROUND OR TEAM (MASK) ×1 IMPLANT
GAUZE PAD ABD 8X10 STRL (GAUZE/BANDAGES/DRESSINGS) ×1 IMPLANT
GAUZE SPONGE 4X4 12PLY STRL (GAUZE/BANDAGES/DRESSINGS) ×2 IMPLANT
GLOVE BIOGEL PI IND STRL 9 (GLOVE) ×1 IMPLANT
GLOVE BIOGEL PI INDICATOR 9 (GLOVE) ×1
GLOVE SURG ORTHO 9.0 STRL STRW (GLOVE) ×2 IMPLANT
GOWN STRL REUS W/ TWL XL LVL3 (GOWN DISPOSABLE) ×2 IMPLANT
GOWN STRL REUS W/TWL XL LVL3 (GOWN DISPOSABLE) ×4
HANDPIECE INTERPULSE COAX TIP (DISPOSABLE) ×2
HDLS TROCR DRIL PIN KNEE 75 (PIN) ×2
INSERT ARTISURF SZ 6-7 LT (Insert) ×1 IMPLANT
KIT BASIN OR (CUSTOM PROCEDURE TRAY) ×2 IMPLANT
KIT TURNOVER KIT B (KITS) ×2 IMPLANT
KNEE SYSTEM FEMUR SZ 6 LT (Knees) ×1 IMPLANT
MANIFOLD NEPTUNE II (INSTRUMENTS) ×2 IMPLANT
NS IRRIG 1000ML POUR BTL (IV SOLUTION) ×2 IMPLANT
PACK TOTAL JOINT (CUSTOM PROCEDURE TRAY) ×2 IMPLANT
PAD ARMBOARD 7.5X6 YLW CONV (MISCELLANEOUS) ×2 IMPLANT
PAD COLD SHLDR WRAP-ON (PAD) ×2 IMPLANT
PIN DRILL HDLS TROCAR 75 4PK (PIN) IMPLANT
SCREW FEMALE HEX FIX 25X2.5 (ORTHOPEDIC DISPOSABLE SUPPLIES) ×1 IMPLANT
SET HNDPC FAN SPRY TIP SCT (DISPOSABLE) ×1 IMPLANT
STAPLER VISISTAT 35W (STAPLE) ×2 IMPLANT
STEM POLY PAT PLY 32M KNEE (Knees) ×1 IMPLANT
STEM TIBIA 5 DEG SZ D L KNEE (Knees) IMPLANT
SUCTION FRAZIER HANDLE 10FR (MISCELLANEOUS)
SUCTION TUBE FRAZIER 10FR DISP (MISCELLANEOUS) IMPLANT
SUT VIC AB 0 CT1 27 (SUTURE) ×4
SUT VIC AB 0 CT1 27XBRD ANBCTR (SUTURE) ×1 IMPLANT
SUT VIC AB 1 CTX 27 (SUTURE) ×1 IMPLANT
SUT VIC AB 1 CTX 36 (SUTURE)
SUT VIC AB 1 CTX36XBRD ANBCTR (SUTURE) IMPLANT
SUT VIC AB 2-0 CT1 27 (SUTURE) ×4
SUT VIC AB 2-0 CT1 TAPERPNT 27 (SUTURE) IMPLANT
TIBIA STEM 5 DEG SZ D L KNEE (Knees) ×2 IMPLANT
TOWEL GREEN STERILE (TOWEL DISPOSABLE) ×2 IMPLANT
TOWEL GREEN STERILE FF (TOWEL DISPOSABLE) ×2 IMPLANT

## 2021-10-11 NOTE — Evaluation (Signed)
Physical Therapy Evaluation ?Patient Details ?Name: Tara Davis ?MRN: [id number] ?DOB: 1946-03-04 ?Today's Date: 10/11/2021 ? ?History of Present Illness ? Pt adm 5/5 for lt TKR. PMH - arthritis, IBS, anxiety, depression  ?Clinical Impression ? Pt admitted with above diagnosis and presents to PT with functional limitations due to deficits listed below (See PT problem list). Pt needs skilled PT to maximize independence and safety to allow discharge to home with significant other.  ?   ?   ? ?Recommendations for follow up therapy are one component of a multi-disciplinary discharge planning process, led by the attending physician.  Recommendations may be updated based on patient status, additional functional criteria and insurance authorization. ? ?Follow Up Recommendations Follow physician's recommendations for discharge plan and follow up therapies ? ?  ?Assistance Recommended at Discharge Intermittent Supervision/Assistance  ?Patient can return home with the following ? A little help with walking and/or transfers;Help with stairs or ramp for entrance ? ?  ?Equipment Recommendations None recommended by PT  ?Recommendations for Other Services ?    ?  ?Functional Status Assessment Patient has had a recent decline in their functional status and demonstrates the ability to make significant improvements in function in a reasonable and predictable amount of time.  ? ?  ?Precautions / Restrictions Precautions ?Precautions: Knee  ? ?  ? ?Mobility ? Bed Mobility ?Overal bed mobility: Needs Assistance ?Bed Mobility: Supine to Sit ?  ?  ?Supine to sit: Min assist ?  ?  ?General bed mobility comments: Assist to bring LLE off of bed ?  ? ?Transfers ?Overall transfer level: Needs assistance ?Equipment used: Rolling walker (2 wheels) ?Transfers: Sit to/from Stand ?Sit to Stand: Min assist ?  ?  ?  ?  ?  ?General transfer comment: Assist to bring hips up and verbal cues for hand placement ?  ? ?Ambulation/Gait ?Ambulation/Gait  assistance: Min guard ?Gait Distance (Feet): 8 Feet ?Assistive device: Rolling walker (2 wheels) ?Gait Pattern/deviations: Step-through pattern, Decreased step length - right, Decreased stance time - left ?Gait velocity: decr ?Gait velocity interpretation: <1.31 ft/sec, indicative of household ambulator ?  ?General Gait Details: Assist for safety ? ?Stairs ?  ?  ?  ?  ?  ? ?Wheelchair Mobility ?  ? ?Modified Rankin (Stroke Patients Only) ?  ? ?  ? ?Balance Overall balance assessment: Mild deficits observed, not formally tested ?  ?  ?  ?  ?  ?  ?  ?  ?  ?  ?  ?  ?  ?  ?  ?  ?  ?  ?   ? ? ? ?Pertinent Vitals/Pain Pain Assessment ?Pain Assessment: 0-10 ?Pain Score: 4  ?Pain Location: lt knee ?Pain Descriptors / Indicators: Aching ?Pain Intervention(s): Limited activity within patient's tolerance, Monitored during session, Repositioned  ? ? ?Home Living Family/patient expects to be discharged to:: Private residence ?Living Arrangements: Spouse/significant other ?Available Help at Discharge: Family;Available 24 hours/day ?Type of Home: House ?Home Access: Stairs to enter ?  ?Entrance Stairs-Number of Steps: 1 ?  ?Home Layout: Two level;Able to live on main level with bedroom/bathroom ?Home Equipment: Conservation officer, nature (2 wheels);Cane - single point ?   ?  ?Prior Function Prior Level of Function : Independent/Modified Independent;Driving ?  ?  ?  ?  ?  ?  ?Mobility Comments: No assistive device ?  ?  ? ? ?Hand Dominance  ?   ? ?  ?Extremity/Trunk Assessment  ? Upper Extremity Assessment ?Upper Extremity Assessment: Overall  WFL for tasks assessed ?  ? ?Lower Extremity Assessment ?Lower Extremity Assessment: LLE deficits/detail ?LLE Deficits / Details: fair quad set, knee AAROM 15-80 sitting EOB ?  ? ?   ?Communication  ? Communication: No difficulties  ?Cognition Arousal/Alertness: Awake/alert ?Behavior During Therapy: [facility] for tasks assessed/performed ?Overall Cognitive Status: Within Functional Limits for tasks assessed ?   ?  ?  ?  ?  ?  ?  ?  ?  ?  ?  ?  ?  ?  ?  ?  ?  ?  ?  ? ?  ?General Comments   ? ?  ?Exercises Total Joint Exercises ?Quad Sets: AAROM, Left, 5 reps, Supine ?Heel Slides: AAROM, Left, 10 reps, Supine ?Knee Flexion: AAROM, Left, 5 reps, Seated ?Goniometric ROM: 15-80  ? ?Assessment/Plan  ?  ?PT Assessment Patient needs continued PT services  ?PT Problem List Decreased strength;Decreased range of motion;Decreased mobility;Decreased knowledge of use of DME;Pain ? ?   ?  ?PT Treatment Interventions DME instruction;Gait training;Stair training;Functional mobility training;Therapeutic activities;Therapeutic exercise;Patient/family education   ? ?PT Goals (Current goals can be found in the Care Plan section)  ?Acute Rehab PT Goals ?Patient Stated Goal: return to more active life ?PT Goal Formulation: With patient ?Time For Goal Achievement: 10/14/21 ?Potential to Achieve Goals: Good ? ?  ?Frequency 7X/week ?  ? ? ?Co-evaluation   ?  ?  ?  ?  ? ? ?  ?AM-PAC PT "6 Clicks" Mobility  ?Outcome Measure Help needed turning from your back to your side while in a flat bed without using bedrails?: A Little ?Help needed moving from lying on your back to sitting on the side of a flat bed without using bedrails?: A Little ?Help needed moving to and from a bed to a chair (including a wheelchair)?: A Little ?Help needed standing up from a chair using your arms (e.g., wheelchair or bedside chair)?: A Little ?Help needed to walk in hospital room?: A Little ?Help needed climbing 3-5 steps with a railing? : A Little ?6 Click Score: 18 ? ?  ?End of Session Equipment Utilized During Treatment: Gait belt ?Activity Tolerance: Patient tolerated treatment well ?Patient left: in chair;with call bell/phone within reach ?Nurse Communication: Mobility status ?PT Visit Diagnosis: Other abnormalities of gait and mobility (R26.89);Pain ?Pain - Right/Left: Left ?Pain - part of body: Knee ?  ? ?Time: 8333-8329 ?PT Time Calculation (min) (ACUTE ONLY): 32  min ? ? ?Charges:   PT Evaluation ?$PT Eval Low Complexity: 1 Low ?PT Treatments ?$Therapeutic Activity: 8-22 mins ?  ?   ? ? ?[facility] PT ?Acute Rehabilitation Services ?Office [phone] ? ? ?Shary Decamp [facility] ?10/11/2021, 6:55 PM ? ?

## 2021-10-11 NOTE — H&P (Signed)
TOTAL KNEE ADMISSION H&P ? ?Patient is being admitted for left total knee arthroplasty. ? ?Subjective: ? ?Chief Complaint:left knee pain. ? ?HPI: SHAYLEE STANISLAWSKI, 76 y.o. female, has a history of pain and functional disability in the left knee due to arthritis and has failed non-surgical conservative treatments for greater than 12 weeks to includeNSAID's and/or analgesics, corticosteriod injections, and activity modification.  Onset of symptoms was gradual, starting 8 years ago with gradually worsening course since that time. The patient noted no past surgery on the left knee(s).  Patient currently rates pain in the left knee(s) at 8 out of 10 with activity. Patient has night pain, worsening of pain with activity and weight bearing, pain that interferes with activities of daily living, pain with passive range of motion, crepitus, and joint swelling.  Patient has evidence of subchondral sclerosis, periarticular osteophytes, and joint space narrowing by imaging studies. This patient has had avascular necrosis of the knee. There is no active infection. ? ?Patient Active Problem List  ? Diagnosis Date Noted  ? Personal history of colonic polyps 08/07/2020  ? Gastroesophageal reflux disease 08/23/2019  ? Tobacco abuse 01/12/2018  ? Cigarette smoker 01/12/2018  ? IBS 07/05/2008  ? ?Past Medical History:  ?Diagnosis Date  ? Adenomatous colon polyp   ? Anxiety   ? Arthritis   ? Bilateral cataracts   ? within 6 mos.  ? Depression   ? Diverticulosis   ? Fatty liver   ? GERD (gastroesophageal reflux disease)   ? H. pylori infection   ? Hyperlipidemia   ? Ulcer 1988  ? Uterine fibroid   ?  ?Past Surgical History:  ?Procedure Laterality Date  ? APPENDECTOMY    ? CATARACT EXTRACTION, BILATERAL    ? CHOLECYSTECTOMY    ? COLONOSCOPY    ? spontaneous vaginal delivery    ? x2  ? TONSILLECTOMY    ? WISDOM TOOTH EXTRACTION    ?  ?Current Facility-Administered Medications  ?Medication Dose Route Frequency Provider Last Rate Last Admin  ?  lactated ringers infusion   Intravenous Continuous Howze, Leanord Hawking, MD      ? tranexamic acid (CYKLOKAPRON) 2,000 mg in sodium chloride 0.9 % 50 mL Topical Application  6,010 mg Topical To OR Newt Minion, MD      ? tranexamic acid (CYKLOKAPRON) IVPB 1,000 mg  1,000 mg Intravenous To OR Newt Minion, MD      ? ?Allergies  ?Allergen Reactions  ? Amoxicillin-Pot Clavulanate Other (See Comments)  ? Codeine Other (See Comments)  ?  REACTION: hallucinations  ? Penicillins Itching  ?  ?Social History  ? ?Tobacco Use  ? Smoking status: Every Day  ?  Packs/day: 1.00  ?  Years: 60.00  ?  Pack years: 60.00  ?  Types: Cigarettes  ? Smokeless tobacco: Never  ?Substance Use Topics  ? Alcohol use: Yes  ?  Alcohol/week: 10.0 standard drinks  ?  Types: 10 Shots of liquor per week  ?  Comment: social  ?  ?Family History  ?Problem Relation Age of Onset  ? Alzheimer's disease Mother   ? Depression Father   ? Diabetes Father   ? Breast cancer Sister   ? Colon cancer Neg Hx   ? Esophageal cancer Neg Hx   ? Stomach cancer Neg Hx   ?  ? ?Review of Systems  ?All other systems reviewed and are negative. ? ?Objective: ? ?Physical Exam ? ?Vital signs in last 24 hours: ?Temp:  [98.4 ?  F (36.9 ?C)] 98.4 ?F (36.9 ?C) (05/05 0543) ?Pulse Rate:  [87] 87 (05/05 0543) ?Resp:  [18] 18 (05/05 0543) ?BP: (157)/(62) 157/62 (05/05 0543) ?SpO2:  [95 %] 95 % (05/05 0543) ?Weight:  [90.7 kg] 90.7 kg (05/05 0543) ? ?Labs: ? ? ?Estimated body mass index is 35.43 kg/m? as calculated from the following: ?  Height as of this encounter: '5\' 3"'$  (1.6 m). ?  Weight as of this encounter: 90.7 kg. ? ? ?Imaging Review ?Plain radiographs demonstrate moderate degenerative joint disease of the left knee(s). The overall alignment ismild varus. The bone quality appears to be adequate for age and reported activity level. ? ? ? ? ? ?Assessment/Plan: ? ?End stage arthritis, left knee  ? ?The patient history, physical examination, clinical judgment of the provider and  imaging studies are consistent with end stage degenerative joint disease of the left knee(s) and total knee arthroplasty is deemed medically necessary. The treatment options including medical management, injection therapy arthroscopy and arthroplasty were discussed at length. The risks and benefits of total knee arthroplasty were presented and reviewed. The risks due to aseptic loosening, infection, stiffness, patella tracking problems, thromboembolic complications and other imponderables were discussed. The patient acknowledged the explanation, agreed to proceed with the plan and consent was signed. Patient is being admitted for inpatient treatment for surgery, pain control, PT, OT, prophylactic antibiotics, VTE prophylaxis, progressive ambulation and ADL's and discharge planning. The patient is planning to be discharged home with home health services ? ? ? ? ?Patient's anticipated LOS is less than 2 midnights, meeting these requirements: ?- Younger than 36 ?- Lives within 1 hour of care ?- Has a competent adult at home to recover with post-op recover ?- NO history of ? - Chronic pain requiring opiods ? - Diabetes ? - Coronary Artery Disease ? - Heart failure ? - Heart attack ? - Stroke ? - DVT/VTE ? - Cardiac arrhythmia ? - Respiratory Failure/COPD ? - Renal failure ? - Anemia ? - Advanced Liver disease ? ? ?

## 2021-10-11 NOTE — Op Note (Signed)
DATE OF SURGERY:  10/11/2021 ? ?TIME: 9:23 AM ? ?PATIENT NAME:  Tara Davis   ? ?AGE: 76 y.o.  ? ? ?PRE-OPERATIVE DIAGNOSIS:  Osteoarthritis Left Knee ? ?POST-OPERATIVE DIAGNOSIS:  Osteoarthritis Left Knee ? ?PROCEDURE:  Procedure(s): ?LEFT TOTAL KNEE ARTHROPLASTY ? ?SURGEON: Meridee Score ? ?ASSISTANT: Joet Dixon ? ?OPERATIVE IMPLANTS: Zimmer Persona.  Femur size 6, Tibia size D, Patella size 32 3-peg oval button, with a 10 mm polyethylene insert medial congruent. ? ?'@ENCIMAGES'$ @ ? ? ? ? ? ?PREOPERATIVE INDICATIONS: ? ? ?Tara Davis is a 76 y.o. year old female with end stage degenerative arthritis of the knee who failed conservative treatment and elected for Total Knee Arthroplasty.  ? ?The risks, benefits, and alternatives were discussed at length including but not limited to the risks of infection, bleeding, nerve injury, stiffness, blood clots, the need for revision surgery, cardiopulmonary complications, among others, and they were willing to proceed. ? ?OPERATIVE DESCRIPTION: ? ?The patient was brought to the operative room and placed in a supine position.  General anesthesia was administered.  IV antibiotics were given.  The lower extremity was prepped and draped in the usual sterile fashion.  Charlie Pitter was used to cover all exposed skin. Time out was performed.   ? ?Anterior quadriceps tendon splitting approach was performed.  The patella was everted and osteophytes were removed.  The anterior horn of the medial and lateral meniscus was removed.  ? ?The distal femur was opened with the drill and the intramedullary distal femoral cutting jig was utilized, set at 5 degrees valgus resecting 9 mm off the distal femur.  Care was taken to protect the collateral ligaments. ? ?Then the extramedullary tibial cutting jig was utilized set for 3 degree posterior slope.  Care was taken during the cut to protect the medial and collateral ligaments.  The proximal tibia was removed along with the posterior horns of the  menisci.  The PCL was sacrificed.   ? ?The extensor gap was measured and was approximately 10 mm.   ? ?The distal femoral sizing jig was applied, taking care to avoid notching.  Then the 4-in-1 cutting jig was applied and the anterior and posterior femur was cut, along with the chamfer cuts.  All posterior osteophytes were removed.  The flexion gap was then measured and was symmetric with the extension gap. ? ?The distal femoral preparation using the appropriate jig to prepare the box. ? ?The patella was then measured, and cut with the saw.   ? ?The proximal tibia sized and prepared accordingly with the reamer and the punch, and then all components were trialed with the poly insert.  The knee was found to have stable balance and full motion.  The knee was irrigated with normal saline and the knee was soaked with TXA. ? ?The above named components were then cemented into place and all excess cement was removed.  The final polyethylene component was in place during cementation. ? ?The knee was kept in extension until the cement hardened. ? ?The knee was then taken through a range of motion and the patella tracked well and the knee irrigated copiously and the parapatellar and subcutaneous tissue closed with vicryl, and skin closed with staples..  A sterile dressing was applied and patient  was taken to the PACU in stable  condition.  There were no complications. ? ?Total tourniquet time was 10 minutes. ?  ?

## 2021-10-11 NOTE — Anesthesia Postprocedure Evaluation (Signed)
Anesthesia Post Note ? ?Patient: Tara Davis ? ?Procedure(s) Performed: LEFT TOTAL KNEE ARTHROPLASTY (Left: Knee) ? ?  ? ?Patient location during evaluation: PACU ?Anesthesia Type: Spinal ?Level of consciousness: awake and alert ?Pain management: pain level controlled ?Vital Signs Assessment: post-procedure vital signs reviewed and stable ?Respiratory status: spontaneous breathing, nonlabored ventilation and respiratory function stable ?Cardiovascular status: blood pressure returned to baseline ?Postop Assessment: no apparent nausea or vomiting, spinal receding, no headache and no backache ?Anesthetic complications: no ? ? ?No notable events documented. ? ?Last Vitals:  ?Vitals:  ? 10/11/21 1026 10/11/21 1030  ?BP: (!) 106/50   ?Pulse: 64 61  ?Resp: 20 (!) 21  ?Temp:    ?SpO2:    ?  ?Last Pain:  ?Vitals:  ? 10/11/21 1015  ?TempSrc:   ?PainSc: 0-No pain  ? ? ?  ?  ?  ?  ?  ?  ? ?Marthenia Rolling ? ? ? ? ?

## 2021-10-11 NOTE — Progress Notes (Signed)
Dr. Sharol Given made aware of patient's documented allergy to Penicillins and Amoxicillin. [place] for patient to receive Ancef per Dr. Sharol Given.  ?

## 2021-10-11 NOTE — Anesthesia Procedure Notes (Signed)
Spinal ? ?Patient location during procedure: OR ?Start time: 10/11/2021 7:43 AM ?End time: 10/11/2021 7:47 AM ?Reason for block: surgical anesthesia ?Staffing ?Performed: anesthesiologist  ?Anesthesiologist: Brennan Bailey, MD ?Preanesthetic Checklist ?Completed: patient identified, IV checked, risks and benefits discussed, surgical consent, monitors and equipment checked, pre-op evaluation and timeout performed ?Spinal Block ?Patient position: sitting ?Prep: DuraPrep and site prepped and draped ?Patient monitoring: continuous pulse ox, blood pressure and heart rate ?Approach: midline ?Location: L3-4 ?Injection technique: single-shot ?Needle ?Needle type: Pencan  ?Needle gauge: 24 G ?Needle length: 9 cm ?Assessment ?Events: CSF return ?Additional Notes ?Risks, benefits, and alternative discussed. Patient gave consent to procedure. Prepped and draped in sitting position. Patient sedated but responsive to voice. Clear CSF obtained on second attempt. Positive terminal aspiration. No pain or paraesthesias with injection. Patient tolerated procedure well. Vital signs stable. Tawny Asal, MD ? ? ? ? ?

## 2021-10-11 NOTE — Plan of Care (Signed)

## 2021-10-11 NOTE — Discharge Instructions (Signed)
INSTRUCTIONS AFTER JOINT REPLACEMENT  ? ?Remove items at home which could result in a fall. This includes throw rugs or furniture in walking pathways ?ICE to the affected joint every three hours while awake for 30 minutes at a time, for at least the first 3-5 days, and then as needed for pain and swelling.  Continue to use ice for pain and swelling. You may notice swelling that will progress down to the foot and ankle.  This is normal after surgery.  Elevate your leg when you are not up walking on it.   ?Continue to use the breathing machine you got in the hospital (incentive spirometer) which will help keep your temperature down.  It is common for your temperature to cycle up and down following surgery, especially at night when you are not up moving around and exerting yourself.  The breathing machine keeps your lungs expanded and your temperature down. ? ? ?DIET:  As you were doing prior to hospitalization, we recommend a well-balanced diet. ? ?DRESSING / WOUND CARE / SHOWERING ? ?You may change your dressing 3-5 days after surgery.  Then change the dressing every day with sterile gauze.  Please use good hand washing techniques before changing the dressing.  Do not use any lotions or creams on the incision until instructed by your surgeon. ? ?ACTIVITY ? ?Increase activity slowly as tolerated, but follow the weight bearing instructions below.   ?No driving for 6 weeks or until further direction given by your physician.  You cannot drive while taking narcotics.  ?No lifting or carrying greater than 10 lbs. until further directed by your surgeon. ?Avoid periods of inactivity such as sitting longer than an hour when not asleep. This helps prevent blood clots.  ?You may return to work once you are authorized by your doctor.  ? ? ? ?WEIGHT BEARING  ? ?Weight bearing as tolerated with assist device (walker, cane, etc) as directed, use it as long as suggested by your surgeon or therapist, typically at least 4-6  weeks. ? ? ?EXERCISES ? ?Results after joint replacement surgery are often greatly improved when you follow the exercise, range of motion and muscle strengthening exercises prescribed by your doctor. Safety measures are also important to protect the joint from further injury. Any time any of these exercises cause you to have increased pain or swelling, decrease what you are doing until you are comfortable again and then slowly increase them. If you have problems or questions, call your caregiver or physical therapist for advice.  ? ?Rehabilitation is important following a joint replacement. After just a few days of immobilization, the muscles of the leg can become weakened and shrink (atrophy).  These exercises are designed to build up the tone and strength of the thigh and leg muscles and to improve motion. Often times heat used for twenty to thirty minutes before working out will loosen up your tissues and help with improving the range of motion but do not use heat for the first two weeks following surgery (sometimes heat can increase post-operative swelling).  ? ?These exercises can be done on a training (exercise) mat, on the floor, on a table or on a bed. Use whatever works the best and is most comfortable for you.    Use music or television while you are exercising so that the exercises are a pleasant break in your day. This will make your life better with the exercises acting as a break in your routine that you can look forward  to.   Perform all exercises about fifteen times, three times per day or as directed.  You should exercise both the operative leg and the other leg as well. ? ?Exercises include: ?  ?Quad Sets - Tighten up the muscle on the front of the thigh (Quad) and hold for 5-10 seconds.   ?Straight Leg Raises - With your knee straight (if you were given a brace, keep it on), lift the leg to 60 degrees, hold for 3 seconds, and slowly lower the leg.  Perform this exercise against resistance later as  your leg gets stronger.  ?Leg Slides: Lying on your back, slowly slide your foot toward your buttocks, bending your knee up off the floor (only go as far as is comfortable). Then slowly slide your foot back down until your leg is flat on the floor again.  ?Angel Wings: Lying on your back spread your legs to the side as far apart as you can without causing discomfort.  ?Hamstring Strength:  Lying on your back, push your heel against the floor with your leg straight by tightening up the muscles of your buttocks.  Repeat, but this time bend your knee to a comfortable angle, and push your heel against the floor.  You may put a pillow under the heel to make it more comfortable if necessary.  ? ?A rehabilitation program following joint replacement surgery can speed recovery and prevent re-injury in the future due to weakened muscles. Contact your doctor or a physical therapist for more information on knee rehabilitation.  ? ? ?CONSTIPATION ? ?Constipation is defined medically as fewer than three stools per week and severe constipation as less than one stool per week.  Even if you have a regular bowel pattern at home, your normal regimen is likely to be disrupted due to multiple reasons following surgery.  Combination of anesthesia, postoperative narcotics, change in appetite and fluid intake all can affect your bowels.  ? ?YOU MUST use at least one of the following options; they are listed in order of increasing strength to get the job done.  They are all available over the counter, and you may need to use some, POSSIBLY even all of these options:   ? ?Drink plenty of fluids (prune juice may be helpful) and high fiber foods ?Colace 100 mg by mouth twice a day  ?Senokot for constipation as directed and as needed Dulcolax (bisacodyl), take with full glass of water  ?Miralax (polyethylene glycol) once or twice a day as needed. ? ?If you have tried all these things and are unable to have a bowel movement in the first 3-4 days  after surgery call either your surgeon or your primary doctor.   ? ?If you experience loose stools or diarrhea, hold the medications until you stool forms back up.  If your symptoms do not get better within 1 week or if they get worse, check with your doctor.  If you experience "the worst abdominal pain ever" or develop nausea or vomiting, please contact the office immediately for further recommendations for treatment. ? ? ?ITCHING:  If you experience itching with your medications, try taking only a single pain pill, or even half a pain pill at a time.  You can also use Benadryl over the counter for itching or also to help with sleep.  ? ?TED HOSE STOCKINGS:  Use stockings on both legs until for at least 2 weeks or as directed by physician office. They may be removed at night for sleeping. ? ?  MEDICATIONS:  See your medication summary on the ?After Visit Summary? that nursing will review with you.  You may have some home medications which will be placed on hold until you complete the course of blood thinner medication.  It is important for you to complete the blood thinner medication as prescribed. ? ?PRECAUTIONS:  If you experience chest pain or shortness of breath - call 911 immediately for transfer to the hospital emergency department.  ? ?If you develop a fever greater that 101 F, purulent drainage from wound, increased redness or drainage from wound, foul odor from the wound/dressing, or calf pain - CONTACT YOUR SURGEON.   ?                                                ?FOLLOW-UP APPOINTMENTS:  If you do not already have a post-op appointment, please call the office for an appointment to be seen by your surgeon.  Guidelines for how soon to be seen are listed in your ?After Visit Summary?, but are typically between 1-4 weeks after surgery. ? ?OTHER INSTRUCTIONS:  ? ?Knee Replacement:  Do not place pillow under knee, focus on keeping the knee straight while resting. CPM instructions: 0-90 degrees, 2 hours in the  morning, 2 hours in the afternoon, and 2 hours in the evening. Place foam block, curve side up under heel at all times except when in CPM or when walking.  DO NOT modify, tear, cut, or change the foam block

## 2021-10-11 NOTE — Transfer of Care (Signed)
Immediate Anesthesia Transfer of Care Note ? ?Patient: Tara Davis ? ?Procedure(s) Performed: LEFT TOTAL KNEE ARTHROPLASTY (Left: Knee) ? ?Patient Location: PACU ? ?Anesthesia Type:MAC and Spinal ? ?Level of Consciousness: awake, drowsy, patient cooperative and responds to stimulation ? ?Airway & Oxygen Therapy: Patient Spontanous Breathing and Patient connected to nasal cannula oxygen ? ?Post-op Assessment: Report given to RN and Post -op Vital signs reviewed and stable ? ?Post vital signs: Reviewed and stable ? ?Last Vitals:  ?Vitals Value Taken Time  ?BP 97/44 10/11/21 0928  ?Temp    ?Pulse 70 10/11/21 0931  ?Resp 15 10/11/21 0931  ?SpO2 96 % 10/11/21 0931  ?Vitals shown include unvalidated device data. ? ?Last Pain:  ?Vitals:  ? 10/11/21 0605  ?TempSrc:   ?PainSc: 3   ?   ? ?Patients Stated Pain Goal: 0 (10/11/21 1683) ? ?Complications: No notable events documented. ?

## 2021-10-11 NOTE — Anesthesia Procedure Notes (Signed)
Anesthesia Regional Block: Adductor canal block  ? ?Pre-Anesthetic Checklist: , timeout performed,  Correct Patient, Correct Site, Correct Laterality,  Correct Procedure, Correct Position, site marked,  Risks and benefits discussed,  Pre-op evaluation,  At surgeon's request and post-op pain management ? ?Laterality: Left ? ?Prep: Maximum Sterile Barrier Precautions used, chloraprep     ?  ?Needles:  ?Injection technique: Single-shot ? ?Needle Type: Echogenic Stimulator Needle   ? ? ?Needle Length: 9cm  ?Needle Gauge: 22  ? ? ? ?Additional Needles: ? ? ?Procedures:,,,, ultrasound used (permanent image in chart),,    ?Narrative:  ?Start time: 10/11/2021 7:10 AM ?End time: 10/11/2021 7:13 AM ?Injection made incrementally with aspirations every 5 mL. ? ?Performed by: Personally  ?Anesthesiologist: Brennan Bailey, MD ? ?Additional Notes: ?Risks, benefits, and alternative discussed. Patient gave consent for procedure. Patient prepped and draped in sterile fashion. Sedation administered, patient remains easily responsive to voice. Relevant anatomy identified with ultrasound guidance. Local anesthetic given in 5cc increments with no signs or symptoms of intravascular injection. No pain or paraesthesias with injection. Patient monitored throughout procedure with signs of LAST or immediate complications. Tolerated well. Ultrasound image placed in chart.  ?Tawny Asal, MD ? ? ? ? ? ? ?

## 2021-10-11 NOTE — Plan of Care (Signed)

## 2021-10-12 DIAGNOSIS — E785 Hyperlipidemia, unspecified: Secondary | ICD-10-CM | POA: Diagnosis not present

## 2021-10-12 DIAGNOSIS — F17210 Nicotine dependence, cigarettes, uncomplicated: Secondary | ICD-10-CM | POA: Diagnosis not present

## 2021-10-12 DIAGNOSIS — Z79899 Other long term (current) drug therapy: Secondary | ICD-10-CM | POA: Diagnosis not present

## 2021-10-12 DIAGNOSIS — M1712 Unilateral primary osteoarthritis, left knee: Secondary | ICD-10-CM | POA: Diagnosis not present

## 2021-10-12 MED ORDER — OXYCODONE-ACETAMINOPHEN 5-325 MG PO TABS: 1.0000 | ORAL_TABLET | ORAL | 0 refills | Status: DC | PRN

## 2021-10-12 NOTE — TOC Transition Note (Signed)
Transition of Care (TOC) - CM/SW Discharge Note ? ? ?Patient Details  ?Name: Tara Davis ?MRN: [id number] ?Date of Birth: 03/24/1946 ? ?Transition of Care (TOC) CM/SW Contact:  ?Bartholomew Crews, RN ?Phone Number: [phone] ?10/12/2021, 9:54 AM ? ? ?Clinical Narrative:    ? ?Noted orders for patient to transition home today. PTA patient was set up for [facility] with [facility]. Alwyn Ren, weekend liaison, was notified. No further TOC needs identified at this time.  ? ?Final next level of care: [place] ?Barriers to Discharge: No Barriers Identified ? ? ?Patient Goals and CMS Choice ?  ?  ?  ? ?Discharge Placement ?  ?           ?  ?  ?  ?  ? ?Discharge Plan and Services ?  ?  ?           ?  ?  ?  ?  ?  ?HH Arranged: PT ?[place] Agency: [place] ?Date HH Agency Contacted: 10/12/21 ?Time [place]: [phone] ?Representative spoke with at [place]: Alwyn Ren ? ?Social Determinants of Health (SDOH) Interventions ?  ? ? ?Readmission Risk Interventions ?   ? View : No data to display.  ?  ?  ?  ? ? ? ? ? ?

## 2021-10-12 NOTE — Progress Notes (Signed)
Physical Therapy Treatment ?Patient Details ?Name: Tara Davis ?MRN: [id number] ?DOB: Dec 31, 1945 ?Today's Date: 10/12/2021 ? ? ?History of Present Illness Pt adm 5/5 for lt TKR. PMH - arthritis, IBS, anxiety, depression ? ?  ?PT Comments  ? ? Pt is progressing well towards goals. She progressed to hallway ambulation and completed supine HEP training. Plan for single curb step this afternoon and seated/standing HEP. Expected to d/c after pm session.   ?  ?Recommendations for follow up therapy are one component of a multi-disciplinary discharge planning process, led by the attending physician.  Recommendations may be updated based on patient status, additional functional criteria and insurance authorization. ? ?Follow Up Recommendations ? Follow physician's recommendations for discharge plan and follow up therapies ?  ?  ?Assistance Recommended at Discharge Intermittent Supervision/Assistance  ?Patient can return home with the following A little help with walking and/or transfers;Help with stairs or ramp for entrance ?  ?Equipment Recommendations ? None recommended by PT  ?  ?Recommendations for Other Services   ? ? ?  ?Precautions / Restrictions Precautions ?Precautions: Knee ?Restrictions ?Weight Bearing Restrictions: Yes  ?  ? ?Mobility ? Bed Mobility ?Overal bed mobility: Needs Assistance ?Bed Mobility: Supine to Sit ?  ?  ?Supine to sit: Min assist ?  ?  ?General bed mobility comments: Assist to bring LLE off of bed ?  ? ?Transfers ?Overall transfer level: Needs assistance ?Equipment used: Rolling walker (2 wheels) ?Transfers: Sit to/from Stand ?Sit to Stand: Min guard ?  ?  ?  ?  ?  ?General transfer comment: pt able to rise w/o assist. cues for hand placement with RW. ?  ? ?Ambulation/Gait ?Ambulation/Gait assistance: Min guard ?Gait Distance (Feet): 75 Feet ?Assistive device: Rolling walker (2 wheels) ?Gait Pattern/deviations: Step-through pattern, Decreased step length - right, Decreased stance time -  left ?Gait velocity: decr ?Gait velocity interpretation: <1.31 ft/sec, indicative of household ambulator ?  ?General Gait Details: Assist for safety ? ? ?Stairs ?  ?  ?  ?  ?  ? ? ?Wheelchair Mobility ?  ? ?Modified Rankin (Stroke Patients Only) ?  ? ? ?  ?Balance Overall balance assessment: Mild deficits observed, not formally tested ?  ?  ?  ?  ?  ?  ?  ?  ?  ?  ?  ?  ?  ?  ?  ?  ?  ?  ?  ? ?  ?Cognition Arousal/Alertness: Awake/alert ?Behavior During Therapy: [facility] for tasks assessed/performed ?Overall Cognitive Status: Within Functional Limits for tasks assessed ?  ?  ?  ?  ?  ?  ?  ?  ?  ?  ?  ?  ?  ?  ?  ?  ?  ?  ?  ? ?  ?Exercises Total Joint Exercises ?Ankle Circles/Pumps: AROM, Both, 10 reps, Seated ?Quad Sets: AROM, Left, 5 reps, Supine ?Towel Squeeze: AROM, Left, 5 reps, Supine ?Short Arc Quad: AROM, Left, 5 reps, Supine ?Heel Slides: AROM, Left, 5 reps, Supine ?Hip ABduction/ADduction: AROM, Left, 5 reps, Supine ?Knee Flexion: AAROM, Left, 5 reps, Seated ? ?  ?General Comments   ?  ?  ? ?Pertinent Vitals/Pain Pain Assessment ?Pain Assessment: Faces ?Faces Pain Scale: Hurts little more ?Pain Location: lt knee ?Pain Descriptors / Indicators: Aching ?Pain Intervention(s): Monitored during session, Limited activity within patient's tolerance, Repositioned  ? ? ?Home Living   ?  ?  ?  ?  ?  ?  ?  ?  ?  ?   ?  ?  Prior Function    ?  ?  ?   ? ?PT Goals (current goals can now be found in the care plan section) Acute Rehab PT Goals ?Patient Stated Goal: return to more active life ?PT Goal Formulation: With patient ?Time For Goal Achievement: 10/14/21 ?Potential to Achieve Goals: Good ?Progress towards PT goals: Progressing toward goals ? ?  ?Frequency ? ? ? 7X/week ? ? ? ?  ?PT Plan Current plan remains appropriate  ? ? ?Co-evaluation   ?  ?  ?  ?  ? ?  ?AM-PAC PT "6 Clicks" Mobility   ?Outcome Measure ? Help needed turning from your back to your side while in a flat bed without using bedrails?: A Little ?Help  needed moving from lying on your back to sitting on the side of a flat bed without using bedrails?: A Little ?Help needed moving to and from a bed to a chair (including a wheelchair)?: A Little ?Help needed standing up from a chair using your arms (e.g., wheelchair or bedside chair)?: A Little ?Help needed to walk in hospital room?: A Little ?Help needed climbing 3-5 steps with a railing? : A Little ?6 Click Score: 18 ? ?  ?End of Session Equipment Utilized During Treatment: Gait belt ?Activity Tolerance: Patient tolerated treatment well ?Patient left: in chair;with call bell/phone within reach ?Nurse Communication: Mobility status ?PT Visit Diagnosis: Other abnormalities of gait and mobility (R26.89);Pain ?Pain - Right/Left: Left ?Pain - part of body: Knee ?  ? ? ?Time: 0175-1025 ?PT Time Calculation (min) (ACUTE ONLY): 61 min ? ?Charges:  $Gait Training: 23-37 mins ?$Therapeutic Exercise: 23-37 mins          ?          ?Benjiman Core, PTA ?Acute Rehab ? ?Allena Katz ?10/12/2021, 12:21 PM ? ?

## 2021-10-12 NOTE — Progress Notes (Signed)
Patient ID: Tara Davis, female   DOB: Jan 08, 1946, 76 y.o.   MRN: [id number] ?Patient is postoperative day 1 total knee arthroplasty.  She has been up with therapy.  Plan for discharge to home with her ice machine with home health therapy. ?

## 2021-10-12 NOTE — Progress Notes (Signed)
Physical Therapy Treatment ?Patient Details ?Name: Tara Davis ?MRN: [id number] ?DOB: [birth year] ?Today's Date: 10/12/2021 ? ? ?History of Present Illness Pt adm 5/5 for lt TKR. PMH - arthritis, IBS, anxiety, depression ? ?  ?PT Comments  ? ? Focus of session - education and practice of curb ascend/descend, as well as seated thereex. Pt is making good progress towards her goals and is expecting d/c home this afternoon.   ?Recommendations for follow up therapy are one component of a multi-disciplinary discharge planning process, led by the attending physician.  Recommendations may be updated based on patient status, additional functional criteria and insurance authorization. ? ?Follow Up Recommendations ? Follow physician's recommendations for discharge plan and follow up therapies ?  ?  ?Assistance Recommended at Discharge Intermittent Supervision/Assistance  ?Patient can return home with the following A little help with walking and/or transfers;Help with stairs or ramp for entrance ?  ?Equipment Recommendations ? None recommended by PT  ?  ?   ?Precautions / Restrictions Precautions ?Precautions: Knee ?Restrictions ?Weight Bearing Restrictions: Yes  ?  ? ?Mobility ? Bed Mobility ?  ?  ?  ?  ?  ?  ?  ?General bed mobility comments: out of bed in recliner ?  ? ?Transfers ?Overall transfer level: Needs assistance ?Equipment used: Rolling walker (2 wheels) ?Transfers: Sit to/from Stand ?Sit to Stand: Min guard ?  ?  ?  ?  ?  ?General transfer comment: min guard for safety, good hand placement and power up from bed and BSC over toilet ?  ? ?Ambulation/Gait ?Ambulation/Gait assistance: Min guard ?Gait Distance (Feet): 50 Feet ?Assistive device: Rolling walker (2 wheels) ?Gait Pattern/deviations: Step-through pattern, Decreased step length - right, Decreased stance time - left, Step-to pattern ?Gait velocity: decr ?Gait velocity interpretation: <1.8 ft/sec, indicate of risk for recurrent falls ?  ?General Gait Details: min  guard for safety, progressed from step to to step through gait, ? ? ?Stairs ?Stairs: Yes ?Stairs assistance: Min assist, Min guard ?Stair Management: No rails, Forwards, With walker ?Number of Stairs: 1 (x4) ?General stair comments: pt has a curb to navigate to get into her home. She is able to place RW up on curb and power up leading with the R foot and put the RW and lead with the L LE down ? ? ?  ? ? ?  ?Balance Overall balance assessment: Mild deficits observed, not formally tested ?  ?  ?  ?  ?  ?  ?  ?  ?  ?  ?  ?  ?  ?  ?  ?  ?  ?  ?  ? ?  ?Cognition Arousal/Alertness: Awake/alert ?Behavior During Therapy: [facility] for tasks assessed/performed ?Overall Cognitive Status: Within Functional Limits for tasks assessed ?  ?  ?  ?  ?  ?  ?  ?  ?  ?  ?  ?  ?  ?  ?  ?  ?  ?  ?  ? ?  ?Exercises Total Joint Exercises ?Ankle Circles/Pumps: AROM, Both, 10 reps, Seated ?Long Arc Quad: AROM, Left, 10 reps ?Knee Flexion: AAROM, AROM, Left, Both, 10 reps, Seated ?Goniometric ROM: 12-85 ? ?  ?General Comments  VSS on RA ?  ?  ? ?Pertinent Vitals/Pain Pain Assessment ?Pain Assessment: Faces ?Faces Pain Scale: Hurts a little bit ?Pain Location: lt knee ?Pain Descriptors / Indicators: Aching ?Pain Intervention(s): Limited activity within patient's tolerance, Monitored during session, Repositioned  ? ? ? ?PT Goals (current  goals can now be found in the care plan section) Acute Rehab PT Goals ?Patient Stated Goal: return to more active life ?PT Goal Formulation: With patient ?Time For Goal Achievement: 10/14/21 ?Potential to Achieve Goals: Good ?Progress towards PT goals: Progressing toward goals ? ?  ?Frequency ? ? ? 7X/week ? ? ? ?  ?PT Plan Current plan remains appropriate  ? ? ?   ?AM-PAC PT "6 Clicks" Mobility   ?Outcome Measure ? Help needed turning from your back to your side while in a flat bed without using bedrails?: A Little ?Help needed moving from lying on your back to sitting on the side of a flat bed without using  bedrails?: A Little ?Help needed moving to and from a bed to a chair (including a wheelchair)?: A Little ?Help needed standing up from a chair using your arms (e.g., wheelchair or bedside chair)?: A Little ?Help needed to walk in hospital room?: A Little ?Help needed climbing 3-5 steps with a railing? : A Little ?6 Click Score: 18 ? ?  ?End of Session Equipment Utilized During Treatment: Gait belt ?Activity Tolerance: Patient tolerated treatment well ?Patient left: in chair;with call bell/phone within reach ?Nurse Communication: Mobility status ?PT Visit Diagnosis: Other abnormalities of gait and mobility (R26.89);Pain ?Pain - Right/Left: Left ?Pain - part of body: Knee ?  ? ? ?Time: 4287-6811 ?PT Time Calculation (min) (ACUTE ONLY): 26 min ? ?Charges:  $Gait Training: 8-22 mins ?$Therapeutic Exercise: 8-22 mins          ?          ? ?Wilmont Olund B. Migdalia Dk PT, DPT ?Acute Rehabilitation Services ?Please use secure chat or  ?Call Office [phone] ? ? ? ?[name] ?10/12/2021, 2:20 PM ? ?

## 2021-10-12 NOTE — Discharge Summary (Signed)
?Discharge Diagnoses:  ?Principal Problem: ?  Total knee replacement status, left ?Active Problems: ?  Unilateral primary osteoarthritis, left knee ? ? ?Surgeries: Procedure(s): ?LEFT TOTAL KNEE ARTHROPLASTY on 10/11/2021  ?  ?Consultants:  ? ?Discharged Condition: Improved ? ?Hospital Course: GENEVIVE PRINTUP is an 76 y.o. female who was admitted 10/11/2021 with a chief complaint of osteoarthritis left knee, with a final diagnosis of Osteoarthritis Left Knee.  Patient was brought to the operating room on 10/11/2021 and underwent Procedure(s): ?LEFT TOTAL KNEE ARTHROPLASTY.   ? ?Patient was given perioperative antibiotics:  ?Anti-infectives (From admission, onward)  ? ? Start     Dose/Rate Route Frequency Ordered Stop  ? 10/11/21 1400  ceFAZolin (ANCEF) IVPB 1 g/50 mL premix       ? 1 g ?100 mL/hr over 30 Minutes Intravenous Every 6 hours 10/11/21 1240 10/11/21 2321  ? 10/11/21 0715  ceFAZolin (ANCEF) IVPB 2g/100 mL premix       ? 2 g ?200 mL/hr over 30 Minutes Intravenous On call to O.R. 10/11/21 5053 10/11/21 0750  ? 10/11/21 0706  ceFAZolin (ANCEF) 2-4 GM/100ML-% IVPB       ?Note to Pharmacy: Tami Lin: cabinet override  ?    10/11/21 0706 10/11/21 0751  ? ?  ?. ? ?Patient was given sequential compression devices, early ambulation, and aspirin for DVT prophylaxis. ? ?Recent vital signs: Patient Vitals for the past 24 hrs: ? BP Temp Temp src Pulse Resp SpO2  ?10/12/21 0742 128/71 98.6 ?F (37 ?C) Oral 87 19 93 %  ?10/12/21 0413 (!) 135/118 99.4 ?F (37.4 ?C) Oral 83 14 90 %  ?10/11/21 2348 (!) 113/57 98.6 ?F (37 ?C) Oral 75 -- 93 %  ?10/11/21 2018 121/60 98.5 ?F (36.9 ?C) Oral 79 18 94 %  ?10/11/21 1455 139/71 97.8 ?F (36.6 ?C) Oral 75 16 91 %  ?10/11/21 1257 116/63 (!) 97.4 ?F (36.3 ?C) Oral 65 15 99 %  ?10/11/21 1215 -- 97.8 ?F (36.6 ?C) -- 66 14 94 %  ?10/11/21 1210 (!) 99/54 -- -- 62 16 --  ?10/11/21 1205 -- -- -- 64 13 --  ?10/11/21 1145 (!) 99/49 -- -- 66 16 99 %  ?10/11/21 1140 (!) 89/56 -- -- (!) 59 20 --   ?10/11/21 1135 (!) 85/49 -- -- (!) 58 15 --  ?10/11/21 1130 93/64 -- -- 60 (!) 21 100 %  ?10/11/21 1125 (!) 89/51 -- -- (!) 58 (!) 25 --  ?10/11/21 1115 (!) 91/49 -- -- (!) 57 16 99 %  ?10/11/21 1100 93/60 -- -- (!) 59 (!) 25 100 %  ?10/11/21 1055 (!) 87/57 -- -- (!) 58 (!) 25 --  ?10/11/21 1050 (!) 87/53 -- -- (!) 58 18 --  ?10/11/21 1045 (!) 90/50 -- -- (!) 57 (!) 22 99 %  ?10/11/21 1040 (!) 95/52 -- -- (!) 57 (!) 26 --  ?10/11/21 1035 (!) 85/54 -- -- (!) 58 (!) 25 --  ?10/11/21 1030 -- -- -- 61 (!) 21 --  ?10/11/21 1026 (!) 106/50 -- -- 64 20 --  ?10/11/21 1025 (!) 93/48 -- -- 65 (!) 26 --  ?10/11/21 1020 (!) 98/50 -- -- 63 (!) 25 --  ?10/11/21 1015 97/63 -- -- 61 (!) 22 94 %  ?10/11/21 1010 (!) 86/53 -- -- 65 (!) 21 --  ?10/11/21 1005 (!) 101/57 -- -- 64 17 --  ?10/11/21 1000 (!) 107/57 -- -- 66 14 --  ?10/11/21 0955 102/60 -- -- 72 17 --  ?  10/11/21 0950 (!) 97/54 -- -- -- 13 --  ?10/11/21 0945 (!) 97/59 -- -- 73 (!) 9 --  ?10/11/21 0940 (!) 97/43 -- -- 69 12 --  ?10/11/21 0935 (!) 94/42 -- -- 70 11 --  ?10/11/21 0930 (!) 97/44 -- -- 72 15 --  ?10/11/21 0925 (!) 75/48 -- -- 74 15 --  ?10/11/21 0920 (!) 66/35 -- -- 67 16 --  ?10/11/21 0915 -- 97.8 ?F (36.6 ?C) -- -- -- --  ?. ? ?Recent laboratory studies: No results found. ? ?Discharge Medications:   ?Allergies as of 10/12/2021   ? ?   Reactions  ? Amoxicillin-pot Clavulanate Other (See Comments)  ? Codeine Other (See Comments)  ? REACTION: hallucinations  ? Penicillins Itching  ? ?  ? ?  ?Medication List  ?  ? ?TAKE these medications   ? ?ARIPiprazole 2 MG tablet ?Commonly known as: ABILIFY ?Take 4 mg by mouth daily. ?  ?atorvastatin 20 MG tablet ?Commonly known as: LIPITOR ?TAKE ONE TABLET BY MOUTH DAILY ?  ?BENEFIBER PREBIOTIC+PROBIOTIC PO ?Take 1 Scoop by mouth daily. ?  ?dicyclomine 10 MG capsule ?Commonly known as: BENTYL ?Take 10 mg by mouth in the morning and at bedtime. ?  ?hyoscyamine 0.125 MG tablet ?Commonly known as: LEVSIN ?Take 0.125 mg by mouth  every 4 (four) hours as needed for cramping. ?  ?loperamide 2 MG tablet ?Commonly known as: IMODIUM A-D ?Take 2 mg by mouth 4 (four) times daily as needed for diarrhea or loose stools. ?  ?LORazepam 0.5 MG tablet ?Commonly known as: ATIVAN ?Take 1 mg by mouth 2 (two) times daily. ?  ?nicotine 21 mg/24hr patch ?Commonly known as: NICODERM CQ - dosed in mg/24 hours ?Place 21 mg onto the skin daily as needed (nicotine). ?  ?omeprazole 40 MG capsule ?Commonly known as: PRILOSEC ?Take 40 mg by mouth 2 (two) times daily before a meal. ?  ?ondansetron 4 MG tablet ?Commonly known as: ZOFRAN ?Take 4 mg by mouth every 8 (eight) hours as needed for nausea or vomiting. ?  ?oxyCODONE-acetaminophen 5-325 MG tablet ?Commonly known as: PERCOCET/ROXICET ?Take 1 tablet by mouth every 4 (four) hours as needed. ?  ?Suprep Bowel Prep Kit 17.5-3.13-1.6 GM/177ML Soln ?Generic drug: Na Sulfate-K Sulfate-Mg Sulf ?Take 1 kit by mouth as directed. ?  ?vortioxetine HBr 20 MG Tabs tablet ?Commonly known as: TRINTELLIX ?Take 20 mg by mouth daily. ?  ? ?  ? ? ?Diagnostic Studies: No results found. ? ?Patient benefited maximally from their hospital stay and there were no complications.   ? ? ?Disposition: Discharge disposition: 01-Home or Self Care ? ? ? ? ? ?Discharge Instructions   ? ? Call MD / Call 911   Complete by: As directed ?  ? If you experience chest pain or shortness of breath, CALL 911 and be transported to the hospital emergency room.  If you develope a fever above 101 F, pus (white drainage) or increased drainage or redness at the wound, or calf pain, call your surgeon's office.  ? Constipation Prevention   Complete by: As directed ?  ? Drink plenty of fluids.  Prune juice may be helpful.  You may use a stool softener, such as Colace (over the counter) 100 mg twice a day.  Use MiraLax (over the counter) for constipation as needed.  ? Diet - low sodium heart healthy   Complete by: As directed ?  ? Increase activity slowly as  tolerated   Complete by: As directed ?  ?   Post-operative opioid taper instructions:   Complete by: As directed ?  ? POST-OPERATIVE OPIOID TAPER INSTRUCTIONS: ?It is important to wean off of your opioid medication as soon as possible. If you do not need pain medication after your surgery it is ok to stop day one. ?Opioids include: ?Codeine, Hydrocodone(Norco, Vicodin), Oxycodone(Percocet, oxycontin) and hydromorphone amongst others.  ?Long term and even short term use of opiods can cause: ?Increased pain response ?Dependence ?Constipation ?Depression ?Respiratory depression ?And more.  ?Withdrawal symptoms can include ?Flu like symptoms ?Nausea, vomiting ?And more ?Techniques to manage these symptoms ?Hydrate well ?Eat regular healthy meals ?Stay active ?Use relaxation techniques(deep breathing, meditating, yoga) ?Do Not substitute Alcohol to help with tapering ?If you have been on opioids for less than two weeks and do not have pain than it is ok to stop all together.  ?Plan to wean off of opioids ?This plan should start within one week post op of your joint replacement. ?Maintain the same interval or time between taking each dose and first decrease the dose.  ?Cut the total daily intake of opioids by one tablet each day ?Next start to increase the time between doses. ?The last dose that should be eliminated is the evening dose.  ? ?  ? ?  ? ? Follow-up Information   ? ? Newt Minion, MD Follow up in 1 week(s).   ?Specialty: Orthopedic Surgery ?Contact information: ?[address] ?[place] [address] ?[phone] ? ? ?  ?  ? ?  ?  ? ?  ? ? ? ?Signed: ?Newt Minion ?10/12/2021, 8:59 AM ? ?

## 2021-10-12 NOTE — TOC Transition Note (Signed)
Transition of Care (TOC) - CM/SW Discharge Note ? ? ?Patient Details  ?Name: CLETA HEATLEY ?MRN: [id number] ?Date of Birth: November 17, 1945 ? ?Transition of Care (TOC) CM/SW Contact:  ?Bartholomew Crews, RN ?Phone Number: [phone] ?10/12/2021, 5:17 PM ? ? ?Clinical Narrative:    ? ?Received notification from nursing that patient has 3n1 and ice machine, but now needs RW. Finally able to get a hold of someone at [facility] who can follow up with patient for RW. DME order placed and sent to attending for cosign.  ? ?Final next level of care: [place] ?Barriers to Discharge: No Barriers Identified ? ? ?Patient Goals and CMS Choice ?  ?  ?  ? ?Discharge Placement ?  ?           ?  ?  ?  ?  ? ?Discharge Plan and Services ?  ?  ?           ?  ?  ?  ?  ?  ?HH Arranged: PT ?[place] Agency: [facility] ?Date HH Agency Contacted: 10/12/21 ?Time [place]: [phone] ?Representative spoke with at [place]: Alwyn Ren ? ?Social Determinants of Health (SDOH) Interventions ?  ? ? ?Readmission Risk Interventions ?   ? View : No data to display.  ?  ?  ?  ? ? ? ? ? ?

## 2021-10-12 NOTE — Plan of Care (Signed)

## 2021-10-13 DIAGNOSIS — F32A Depression, unspecified: Secondary | ICD-10-CM | POA: Diagnosis not present

## 2021-10-13 DIAGNOSIS — Z471 Aftercare following joint replacement surgery: Secondary | ICD-10-CM | POA: Diagnosis not present

## 2021-10-13 DIAGNOSIS — Z9841 Cataract extraction status, right eye: Secondary | ICD-10-CM | POA: Diagnosis not present

## 2021-10-13 DIAGNOSIS — K760 Fatty (change of) liver, not elsewhere classified: Secondary | ICD-10-CM | POA: Diagnosis not present

## 2021-10-13 DIAGNOSIS — K589 Irritable bowel syndrome without diarrhea: Secondary | ICD-10-CM | POA: Diagnosis not present

## 2021-10-13 DIAGNOSIS — Z9079 Acquired absence of other genital organ(s): Secondary | ICD-10-CM | POA: Diagnosis not present

## 2021-10-13 DIAGNOSIS — Z9842 Cataract extraction status, left eye: Secondary | ICD-10-CM | POA: Diagnosis not present

## 2021-10-13 DIAGNOSIS — Z7982 Long term (current) use of aspirin: Secondary | ICD-10-CM | POA: Diagnosis not present

## 2021-10-13 DIAGNOSIS — R7309 Other abnormal glucose: Secondary | ICD-10-CM | POA: Diagnosis not present

## 2021-10-13 DIAGNOSIS — E785 Hyperlipidemia, unspecified: Secondary | ICD-10-CM | POA: Diagnosis not present

## 2021-10-13 DIAGNOSIS — F17210 Nicotine dependence, cigarettes, uncomplicated: Secondary | ICD-10-CM | POA: Diagnosis not present

## 2021-10-13 DIAGNOSIS — F419 Anxiety disorder, unspecified: Secondary | ICD-10-CM | POA: Diagnosis not present

## 2021-10-13 DIAGNOSIS — K219 Gastro-esophageal reflux disease without esophagitis: Secondary | ICD-10-CM | POA: Diagnosis not present

## 2021-10-13 DIAGNOSIS — K5790 Diverticulosis of intestine, part unspecified, without perforation or abscess without bleeding: Secondary | ICD-10-CM | POA: Diagnosis not present

## 2021-10-13 DIAGNOSIS — Z96652 Presence of left artificial knee joint: Secondary | ICD-10-CM | POA: Diagnosis not present

## 2021-10-14 ENCOUNTER — Telehealth: Payer: Self-pay | Admitting: Orthopedic Surgery

## 2021-10-14 ENCOUNTER — Encounter (HOSPITAL_COMMUNITY): Payer: Self-pay | Admitting: Orthopedic Surgery

## 2021-10-14 ENCOUNTER — Other Ambulatory Visit: Payer: Self-pay | Admitting: Orthopedic Surgery

## 2021-10-14 DIAGNOSIS — R531 Weakness: Secondary | ICD-10-CM | POA: Diagnosis not present

## 2021-10-14 MED ORDER — HYDROCODONE-ACETAMINOPHEN 5-325 MG PO TABS: 1.0000 | ORAL_TABLET | Freq: Four times a day (QID) | ORAL | 0 refills | Status: DC | PRN

## 2021-10-14 NOTE — Telephone Encounter (Signed)
I SW pt, she says she is having constant diarrhea all day with oxycodone. She has been vomiting as well but that had stopped as of yesterday. She is taking her oxycodone TID and cutting her evening pill in half hoping it will help her stomach upset, which has not. ?I made sure she is drinking gatorade or powerade to help with electrolytes. Please advise on medication, thank you ?

## 2021-10-14 NOTE — Telephone Encounter (Signed)
Pt informed

## 2021-10-14 NOTE — Telephone Encounter (Signed)
Pt called requesting a call back from [place] or [place]. Pt states she is having a diarrhea reaction to oxycodone. Please call pt at [phone]. Had total knee replacement. ?

## 2021-10-15 ENCOUNTER — Encounter (HOSPITAL_COMMUNITY): Payer: Self-pay | Admitting: Orthopedic Surgery

## 2021-10-16 ENCOUNTER — Telehealth: Payer: Self-pay | Admitting: Internal Medicine

## 2021-10-16 NOTE — Telephone Encounter (Signed)
error 

## 2021-10-16 NOTE — Telephone Encounter (Signed)
Left message for patient to call back and schedule Medicare Annual Wellness Visit (AWV) either virtually or in office.  Left both my jabber number [phone] and office number  ? ? ?Last AWV  06/14/20 ?; please schedule at anytime with [facility]  ? ? ? ?

## 2021-10-16 NOTE — Telephone Encounter (Signed)
Patient returned my call.  ? ?She stated she thought she had been discharged from practice.  I looked in her chart and found letter dated 08/22/21.  She wanted me to ask if you could call her to discuss her being discharged from practice. ?

## 2021-10-18 ENCOUNTER — Ambulatory Visit (INDEPENDENT_AMBULATORY_CARE_PROVIDER_SITE_OTHER): Payer: Medicare Other | Admitting: Family

## 2021-10-18 ENCOUNTER — Encounter: Payer: Self-pay | Admitting: Family

## 2021-10-18 DIAGNOSIS — M1712 Unilateral primary osteoarthritis, left knee: Secondary | ICD-10-CM

## 2021-10-18 NOTE — Progress Notes (Signed)
? ?  Post-Op Visit Note ?  ?Patient: Tara Davis           ?Date of Birth: [birth year]           ?MRN: [id number] ?Visit Date: 10/18/2021 ?PCP: Glendale Chard, MD ? ?Chief Complaint:  ?Chief Complaint  ?Patient presents with  ? Left Knee - Routine Post Op  ?  10/11/21 left total knee replacement  ? ? ?HPI:  ?HPI ?The patient is a 76 year old woman seen status post left total knee arthroplasty 1 week ago.  She is doing quite well and working with home health physical therapy. ?Ortho Exam ?Incision well approximated staples there is scant serosanguineous drainage there is no erythema no warmth ? ?Visit Diagnoses: No diagnosis found. ? ?Plan: Continue with physical therapy.  May shower may get this wet do not submerge we will follow-up in 1 more week with radiographs and staple removal ? ?Follow-Up Instructions: No follow-ups on file.  ? ?Imaging: ?No results found. ? ?Orders:  ?No orders of the defined types were placed in this encounter. ? ?No orders of the defined types were placed in this encounter. ? ? ? ?PMFS History: ?Patient Active Problem List  ? Diagnosis Date Noted  ? Total knee replacement status, left 10/11/2021  ? Unilateral primary osteoarthritis, left knee   ? Personal history of colonic polyps 08/07/2020  ? Gastroesophageal reflux disease 08/23/2019  ? Tobacco abuse 01/12/2018  ? Cigarette smoker 01/12/2018  ? IBS 07/05/2008  ? ?Past Medical History:  ?Diagnosis Date  ? Adenomatous colon polyp   ? Anxiety   ? Arthritis   ? Bilateral cataracts   ? within 6 mos.  ? Depression   ? Diverticulosis   ? Fatty liver   ? GERD (gastroesophageal reflux disease)   ? H. pylori infection   ? Hyperlipidemia   ? Ulcer 1988  ? Uterine fibroid   ?  ?Family History  ?Problem Relation Age of Onset  ? Alzheimer's disease Mother   ? Depression Father   ? Diabetes Father   ? Breast cancer Sister   ? Colon cancer Neg Hx   ? Esophageal cancer Neg Hx   ? Stomach cancer Neg Hx   ?  ?Past Surgical History:  ?Procedure Laterality Date   ? APPENDECTOMY    ? CATARACT EXTRACTION, BILATERAL    ? CHOLECYSTECTOMY    ? COLONOSCOPY    ? spontaneous vaginal delivery    ? x2  ? TONSILLECTOMY    ? TOTAL KNEE ARTHROPLASTY Left 10/11/2021  ? Procedure: LEFT TOTAL KNEE ARTHROPLASTY;  Surgeon: Newt Minion, MD;  Location: [facility];  Service: Orthopedics;  Laterality: Left;  ? WISDOM TOOTH EXTRACTION    ? ?Social History  ? ?Occupational History  ? 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 Smoking status: Every Day  ?  Packs/day: 1.00  ?  Years: 60.00  ?  Pack years: 60.00  ?  Types: Cigarettes  ? Smokeless tobacco: Never  ?Vaping Use  ? Vaping Use: Never used  ?Substance and Sexual Activity  ? Alcohol use: Yes  ?  Alcohol/week: 10.0 standard drinks  ?  Types: 10 Shots of liquor per week  ?  Comment: social  ? Drug use: No  ? Sexual activity: Not Currently  ? ? ?

## 2021-10-21 ENCOUNTER — Telehealth: Payer: Self-pay | Admitting: Orthopedic Surgery

## 2021-10-21 NOTE — Telephone Encounter (Signed)
Patient called. She has questions about her medication. Would like a call back. [phone] ?

## 2021-10-22 NOTE — Telephone Encounter (Signed)
Pt had question of taking aspirin with trintellex. Was told shouldn't take two together. Per Junie Panning okay to stop aspirin since she is in PT. ?

## 2021-10-24 ENCOUNTER — Encounter: Payer: Self-pay | Admitting: Orthopedic Surgery

## 2021-10-24 ENCOUNTER — Ambulatory Visit (INDEPENDENT_AMBULATORY_CARE_PROVIDER_SITE_OTHER): Payer: Medicare Other

## 2021-10-24 ENCOUNTER — Ambulatory Visit (INDEPENDENT_AMBULATORY_CARE_PROVIDER_SITE_OTHER): Payer: Medicare Other | Admitting: Orthopedic Surgery

## 2021-10-24 DIAGNOSIS — Z96652 Presence of left artificial knee joint: Secondary | ICD-10-CM | POA: Diagnosis not present

## 2021-10-24 NOTE — Progress Notes (Signed)
Office Visit Note   Patient: Tara Davis           Date of Birth: [birth year]           MRN: [id number] Visit Date: 10/24/2021              Requested by: Glendale Chard, [address] [place],  [place] [address] PCP: Glendale Chard, MD  Chief Complaint  Patient presents with   Left Knee - Routine Post Op    10/11/21 left TKA      HPI: Patient is a 76 year old woman who presents 2 weeks status post left total knee arthroplasty.  Patient has completed her home health therapy and will start outpatient therapy next week.  Assessment & Plan: Visit Diagnoses:  1. Status post total left knee replacement     Plan: Work on knee extension and strengthening recommended a 2 extra-large sock to help decrease the swelling.  Follow-Up Instructions: Return in about 2 weeks (around 11/07/2021).   Ortho Exam  Patient is alert, oriented, no adenopathy, well-dressed, normal affect, normal respiratory effort. Examination the incision is well-healed the sutures are harvested.  She lacks about 5 degrees to full extension she does have venous stasis swelling with her calf 40 cm in circumference there is no tenderness to palpation.  Imaging: XR KNEE 3 VIEW LEFT  Result Date: 10/24/2021 2 view radiographs of the left knee shows a stable total knee arthroplasty no complicating features.  No images are attached to the encounter.  Labs: Lab Results  Component Value Date   HGBA1C 6.2 (H) 10/03/2021   HGBA1C 5.9 (H) 04/13/2018   LABORGA Multiple bacterial morphotypes present, none 12/17/2014   LABORGA predominant. Suggest appropriate recollection if 12/17/2014   LABORGA clinically indicated. 12/17/2014     Lab Results  Component Value Date   ALBUMIN 4.4 10/03/2021   ALBUMIN 4.5 12/07/2018   ALBUMIN 4.6 04/13/2018    Lab Results  Component Value Date   MG 2.1 01/07/2008   Lab Results  Component Value Date   VD25OH 31.2 04/13/2018    No results found for:  PREALBUMIN    Latest Ref Rng & Units 10/03/2021    3:51 PM 12/07/2018    9:22 AM 04/13/2018   12:52 PM  CBC EXTENDED  WBC 3.4 - 10.8 x10E3/uL 9.8   9.7   8.7    RBC 3.77 - 5.28 x10E6/uL 3.93   4.34   4.38    Hemoglobin 11.1 - 15.9 g/dL 13.7   14.5   14.6    HCT 34.0 - 46.6 % 38.2   41.9   42.2    Platelets 150 - 450 x10E3/uL 288   299   289    NEUT# 1.4 - 7.0 x10E3/uL  6.8     Lymph# 0.7 - 3.1 x10E3/uL  2.1        There is no height or weight on file to calculate BMI.  Orders:  Orders Placed This Encounter  Procedures   XR KNEE 3 VIEW LEFT   No orders of the defined types were placed in this encounter.    Procedures: No procedures performed  Clinical Data: No additional findings.  ROS:  All other systems negative, except as noted in the HPI. Review of Systems  Objective: Vital Signs: There were no vitals taken for this visit.  Specialty Comments:  No specialty comments available.  PMFS History: Patient Active Problem List   Diagnosis Date Noted   Total knee  replacement status, left 10/11/2021   Unilateral primary osteoarthritis, left knee    Personal history of colonic polyps 08/07/2020   Gastroesophageal reflux disease 08/23/2019   Tobacco abuse 01/12/2018   Cigarette smoker 01/12/2018   IBS 07/05/2008   Past Medical History:  Diagnosis Date   Adenomatous colon polyp    Anxiety    Arthritis    Bilateral cataracts    within 6 mos.   Depression    Diverticulosis    Fatty liver    GERD (gastroesophageal reflux disease)    H. pylori infection    Hyperlipidemia    Ulcer 1988   Uterine fibroid     Family History  Problem Relation Age of Onset   Alzheimer's disease Mother    Depression Father    Diabetes Father    Breast cancer Sister    Colon cancer Neg Hx    Esophageal cancer Neg Hx    Stomach cancer Neg Hx     Past Surgical History:  Procedure Laterality Date   APPENDECTOMY     CATARACT EXTRACTION, BILATERAL     CHOLECYSTECTOMY      COLONOSCOPY     spontaneous vaginal delivery     x2   TONSILLECTOMY     TOTAL KNEE ARTHROPLASTY Left 10/11/2021   Procedure: LEFT TOTAL KNEE ARTHROPLASTY;  Surgeon: Newt Minion, MD;  Location: [facility];  Service: Orthopedics;  Laterality: Left;   WISDOM TOOTH EXTRACTION     Social History   Occupational History   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

## 2021-10-29 ENCOUNTER — Ambulatory Visit (INDEPENDENT_AMBULATORY_CARE_PROVIDER_SITE_OTHER): Payer: Medicare Other | Admitting: Physical Therapy

## 2021-10-29 ENCOUNTER — Other Ambulatory Visit: Payer: Self-pay

## 2021-10-29 ENCOUNTER — Encounter: Payer: Self-pay | Admitting: Physical Therapy

## 2021-10-29 DIAGNOSIS — R2689 Other abnormalities of gait and mobility: Secondary | ICD-10-CM

## 2021-10-29 DIAGNOSIS — M25562 Pain in left knee: Secondary | ICD-10-CM | POA: Diagnosis not present

## 2021-10-29 DIAGNOSIS — M25662 Stiffness of left knee, not elsewhere classified: Secondary | ICD-10-CM

## 2021-10-29 DIAGNOSIS — R600 Localized edema: Secondary | ICD-10-CM | POA: Diagnosis not present

## 2021-10-29 DIAGNOSIS — M6281 Muscle weakness (generalized): Secondary | ICD-10-CM | POA: Diagnosis not present

## 2021-10-29 DIAGNOSIS — R2681 Unsteadiness on feet: Secondary | ICD-10-CM

## 2021-10-29 NOTE — Therapy (Signed)
OUTPATIENT PHYSICAL THERAPY LOWER EXTREMITY EVALUATION   Patient Name: Tara Davis MRN: [id number] DOB:1946-05-14, 76 y.o., female Today's Date: 10/29/2021   PT End of Session - 10/29/21 1706     Visit Number 1    Number of Visits 20    Date for PT Re-Evaluation 12/30/21    Authorization Type BCBS Medicare    Authorization Time Period $10 COPAY  Deductible ($0.00)Deductible has been met  Out-of-Pocket Limit ($3,950.00) $455.62 Paid  [phone] to go    Progress Note Due on Visit 10    PT Start Time 1347    PT Stop Time 1432    PT Time Calculation (min) 45 min    Activity Tolerance Patient tolerated treatment well;Patient limited by fatigue;Patient limited by pain    Behavior During Therapy WFL for tasks assessed/performed             Past Medical History:  Diagnosis Date   Adenomatous colon polyp    Anxiety    Arthritis    Bilateral cataracts    within 6 mos.   Depression    Diverticulosis    Fatty liver    GERD (gastroesophageal reflux disease)    H. pylori infection    Hyperlipidemia    Ulcer 1988   Uterine fibroid    Past Surgical History:  Procedure Laterality Date   APPENDECTOMY     CATARACT EXTRACTION, BILATERAL     CHOLECYSTECTOMY     COLONOSCOPY     spontaneous vaginal delivery     x2   TONSILLECTOMY     TOTAL KNEE ARTHROPLASTY Left 10/11/2021   Procedure: LEFT TOTAL KNEE ARTHROPLASTY;  Surgeon: Newt Minion, MD;  Location: [facility];  Service: Orthopedics;  Laterality: Left;   WISDOM TOOTH EXTRACTION     Patient Active Problem List   Diagnosis Date Noted   Total knee replacement status, left 10/11/2021   Unilateral primary osteoarthritis, left knee    Personal history of colonic polyps 08/07/2020   Gastroesophageal reflux disease 08/23/2019   Tobacco abuse 01/12/2018   Cigarette smoker 01/12/2018   IBS 07/05/2008    PCP: Glendale Chard, MD  REFERRING PROVIDER: Suzan Slick, NP  REFERRING DIAG: [phone] (ICD-10-CM) - Unilateral primary  osteoarthritis, left knee  THERAPY DIAG:  Stiffness of left knee, not elsewhere classified  Acute pain of left knee  Muscle weakness (generalized)  Localized edema  Unsteadiness on feet  Other abnormalities of gait and mobility  Rationale for Evaluation and Treatment Rehabilitation  ONSET DATE: Left TKA 10/11/2021  SUBJECTIVE:   SUBJECTIVE STATEMENT: She underwent left Total Knee Replacement on 10/11/2021 due to OA. She noted significant mobility limitations since Dec. 2022.  She had 2 weeks of HHPT since surgery.   PERTINENT HISTORY: OA, Left TKR 10/11/21, anxiety, depresssion, IBS diverticulosis,   PAIN:  Are you having pain? Yes: NPRS scale: 3/10 at eval and in last week 0/10 to 7/10 Pain location: left knee anterior & posterior lateral Pain description: muscle spasms & throbbing Aggravating factors: arising esp if sitting a while, sitting down forcing knee to flex,  Relieving factors: ice, heat  PRECAUTIONS: None  WEIGHT BEARING RESTRICTIONS No  FALLS:  Has patient fallen in last 6 months? 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  PLOF: Independent, Independent with household mobility without device, and Independent with community mobility without device  PATIENT GOALS  function without pain   OBJECTIVE:   DIAGNOSTIC FINDINGS: 10/24/2021: 2 view radiographs of the left knee shows a stable total knee arthroplasty  no complicating features.  PATIENT SURVEYS:  FOTO 10/29/21:  43% and target 61%  COGNITION: 10/29/21:  Overall cognitive status: Within functional limits for tasks assessed    POSTURE: 10/29/21:  weight shift right  EDEMA: 10/29/21:  RLE:  above knee 42.3cm around knee 40.3cm  below knee  34.8cm 10/29/21:  LLE:  above knee 48cm around knee 44.3cm below knee 39.3cm  PALPATION: 10/29/21:  pt has ~6 areas on incision that appear to have internal sutures working out. Edema pocket and tenderness proximal incision. Tenderness along incision & joint line.    LOWER EXTREMITY ROM:  ROM P:passive A:active Right Eval 10/29/21 Left Eval 10/29/21  Hip flexion    Hip extension    Hip abduction    Hip adduction    Hip internal rotation    Hip external rotation    Knee flexion  Seated P: 89* Supine  A: 65*  Knee extension  Seated A: -18* Supine P: -9*  Ankle dorsiflexion    Ankle plantarflexion    Ankle inversion    Ankle eversion     (Blank rows = not tested)  LOWER EXTREMITY MMT:  MMT Right 10/29/21 Left 10/29/21  Hip flexion    Hip extension    Hip abduction    Hip adduction    Hip internal rotation    Hip external rotation    Knee flexion  3-/5  Knee extension  3-/5  Ankle dorsiflexion    Ankle plantarflexion    Ankle inversion    Ankle eversion     (Blank rows = not tested)   GAIT: 10/29/21:  Distance walked: 100' Assistive device utilized: Single point cane Level of assistance: SBA verbal cues Comments: antalgic with decreased stance LLE, left knee flexed in stance & minimal knee flexion in swing.     TODAY'S TREATMENT: Therex:         HEP instruction/performance c cues for techniques, handout provided.  Trial set performed of each for comprehension and symptom assessment.  See below for exercise list.  Noted decrease in range for exercise based off symptoms.  Self-care:  Pt reports Dr. Sharol Given recommended purchasing extra large Vivewear sock so top portion will cover her knee. She reported that the top rubbed proximal incision irritating area.  PT donned this compression sock for her with instruction in technique. Even with pulling heal of sock higher than her heal the upper portion of sock did fall at her proximal incision.  PT used internet  to show pt thigh high compression socks thru [employer]. Pt verbalized understanding.     PATIENT EDUCATION:  Education details: Access Code: [email or web address] Person educated: Patient Education method: Explanation, Demonstration, Tactile cues, Verbal cues, and Handouts Education comprehension: verbalized understanding, returned demonstration, verbal cues required, tactile cues required, and needs further education   HOME EXERCISE PROGRAM: Access Code: [email or web address] URL: [email or web address] Date: 10/29/2021 Prepared by: Jamey Reas  Exercises - Quad Setting and Stretching  - 2-4 x daily - 7 x weekly - 5-10 sets - 10 reps - prop 5-10 minutes & quad set5 seconds hold - Supine Heel Slide with Strap  - 2-3 x daily - 7 x weekly - 2-3 sets - 10 reps - 5 seconds hold -  Supine Straight Leg Raises  - 2-3 x daily - 7 x weekly - 2-3 sets - 10 reps - 5 seconds hold - Seated Knee Flexion Extension AROM   - 2-4 x daily - 7 x weekly - 2-3 sets - 10 reps - 5 seconds hold - Seated straight leg lifts  - 2-3 x daily - 7 x weekly - 2-3 sets - 10 reps - 5 seconds hold - Seated Long Arc Quad  - 2-3 x daily - 7 x weekly - 2 sets - 10 reps - 5 seconds hold - Seated Hamstring Stretch with Strap  - 2-4 x daily - 7 x weekly - 1 sets - 3 reps - 20-30 seconds hold  ASSESSMENT:  CLINICAL IMPRESSION: Patient is a 76 y.o. female who was seen today for physical therapy evaluation and treatment for Left TKR.  Patient demonstrates decreased strength, ROM, increased edema and pain with gait abnormalities affecting functional mobility.  OBJECTIVE IMPAIRMENTS Abnormal gait, decreased activity tolerance, decreased balance, decreased knowledge of condition, decreased knowledge of use of DME, decreased mobility, difficulty walking, decreased ROM, decreased strength, increased edema, increased muscle spasms, impaired flexibility, postural dysfunction, and pain.   ACTIVITY LIMITATIONS carrying, lifting, bending,  sitting, standing, squatting, sleeping, stairs, transfers, and locomotion level  PARTICIPATION LIMITATIONS: meal prep, cleaning, laundry, driving, community activity, and occupation  [place] and 1-2 comorbidities: see PMH above  are also affecting patient's functional outcome.   REHAB POTENTIAL: Good  CLINICAL DECISION MAKING: Stable/uncomplicated  EVALUATION COMPLEXITY: Low   GOALS: Goals reviewed with patient? Yes  SHORT TERM GOALS: Target date: 11/29/2021  Patient independent and verbalizes compliance with initial HEP Baseline: SEE OBJECTIVE DATA Goal status: INITIAL  2.  Patient reports 50% improvement in left knee pain. Baseline: SEE OBJECTIVE DATA Goal status: INITIAL  3.  PROM left knee extension -3* to flexion 90* Baseline: SEE OBJECTIVE DATA Goal status: INITIAL  LONG TERM GOALS: Target date: 12/27/2021  Patient will improve FOTO score to 61% Baseline: SEE OBJECTIVE DATA Goal status: INITIAL  2.  Patient reports left knee pain </= 1/10 with standing & gait activities.  Baseline: SEE OBJECTIVE DATA Goal status: INITIAL  3.  Left Knee PROM 0* extension to 100* flexion Baseline: SEE OBJECTIVE DATA Goal status: INITIAL  4.  Left Knee AROM seated -1* extension to 95* flexion Baseline: SEE OBJECTIVE DATA Goal status: INITIAL  5.  Patient ambulates >500' community distances including negotiating ramps, curbs & stairs without device independently. Baseline: SEE OBJECTIVE DATA Goal status: INITIAL  PLAN: PT FREQUENCY: 2-3 x/wk  PT DURATION:  9 weeks  PLANNED INTERVENTIONS: Therapeutic exercises, Therapeutic activity, Neuromuscular re-education, Balance training, Gait training, Patient/Family education, Joint mobilization, Stair training, DME instructions, Electrical stimulation, Cryotherapy, Moist heat, Taping, Vasopneumatic device, and Manual therapy  PLAN FOR NEXT SESSION: check & update HEP, therapeutic exercise including Nustep, check on  compression hose, manual therapy, vaso to end   Jamey Reas, PT, DPT 10/29/2021, 5:11 PM

## 2021-10-30 ENCOUNTER — Ambulatory Visit: Payer: Medicare Other | Admitting: Physical Therapy

## 2021-10-30 ENCOUNTER — Encounter: Payer: Self-pay | Admitting: Physical Therapy

## 2021-10-30 DIAGNOSIS — R2681 Unsteadiness on feet: Secondary | ICD-10-CM

## 2021-10-30 DIAGNOSIS — M25662 Stiffness of left knee, not elsewhere classified: Secondary | ICD-10-CM | POA: Diagnosis not present

## 2021-10-30 DIAGNOSIS — R2689 Other abnormalities of gait and mobility: Secondary | ICD-10-CM

## 2021-10-30 DIAGNOSIS — M25562 Pain in left knee: Secondary | ICD-10-CM

## 2021-10-30 DIAGNOSIS — M6281 Muscle weakness (generalized): Secondary | ICD-10-CM

## 2021-10-30 DIAGNOSIS — R600 Localized edema: Secondary | ICD-10-CM

## 2021-10-30 NOTE — Therapy (Signed)
OUTPATIENT PHYSICAL THERAPY TREATMENT NOTE   Patient Name: Tara Davis MRN: [id number] DOB:02-01-1946, 76 y.o., female Today's Date: 10/30/2021  PCP: Glendale Chard, MD REFERRING PROVIDER: Suzan Slick, NP  END OF SESSION:   PT End of Session - 10/30/21 1430     Visit Number 2    Number of Visits 20    Date for PT Re-Evaluation 12/30/21    Authorization Type BCBS Medicare    Authorization Time Period $10 COPAY  Deductible ($0.00)Deductible has been met  Out-of-Pocket Limit ($3,950.00) $455.62 Paid  $3,494.38 to go    Progress Note Due on Visit 10    PT Start Time 1430    PT Stop Time 1521    PT Time Calculation (min) 51 min    Activity Tolerance Patient tolerated treatment well;Patient limited by fatigue;Patient limited by pain    Behavior During Therapy WFL for tasks assessed/performed             Past Medical History:  Diagnosis Date   Adenomatous colon polyp    Anxiety    Arthritis    Bilateral cataracts    within 6 mos.   Depression    Diverticulosis    Fatty liver    GERD (gastroesophageal reflux disease)    H. pylori infection    Hyperlipidemia    Ulcer 1988   Uterine fibroid    Past Surgical History:  Procedure Laterality Date   APPENDECTOMY     CATARACT EXTRACTION, BILATERAL     CHOLECYSTECTOMY     COLONOSCOPY     spontaneous vaginal delivery     x2   TONSILLECTOMY     TOTAL KNEE ARTHROPLASTY Left 10/11/2021   Procedure: LEFT TOTAL KNEE ARTHROPLASTY;  Surgeon: Newt Minion, MD;  Location: [facility];  Service: Orthopedics;  Laterality: Left;   WISDOM TOOTH EXTRACTION     Patient Active Problem List   Diagnosis Date Noted   Total knee replacement status, left 10/11/2021   Unilateral primary osteoarthritis, left knee    Personal history of colonic polyps 08/07/2020   Gastroesophageal reflux disease 08/23/2019   Tobacco abuse 01/12/2018   Cigarette smoker 01/12/2018   IBS 07/05/2008    REFERRING DIAG: M17.12 (ICD-10-CM) - Unilateral primary  osteoarthritis, left knee  THERAPY DIAG:  Stiffness of left knee, not elsewhere classified  Acute pain of left knee  Muscle weakness (generalized)  Localized edema  Unsteadiness on feet  Other abnormalities of gait and mobility  Rationale for Evaluation and Treatment Rehabilitation  PERTINENT HISTORY: OA, Left TKR 10/11/21, anxiety, depresssion, IBS diverticulosis  PRECAUTIONS: None  SUBJECTIVE: She did her exercises without issues.   PAIN:  Are you having pain? Yes: NPRS scale: 3/10 Pain location: left knee & ankle Pain description: dull Aggravating factors: first standing on LLE, getting up Relieving factors: ice, heat   OBJECTIVE: (objective measures completed at initial evaluation unless otherwise dated)  OBJECTIVE:    DIAGNOSTIC FINDINGS: 10/24/2021: 2 view radiographs of the left knee shows a stable total knee arthroplasty  no complicating features.   PATIENT SURVEYS:  FOTO 10/29/21:  43% and target 61%   COGNITION: 10/29/21:  Overall cognitive status: Within functional limits for tasks assessed                        POSTURE: 10/29/21:  weight shift right   EDEMA: 10/29/21:  RLE:  above knee 42.3cm around knee 40.3cm below knee  34.8cm 10/29/21:  LLE:  above knee  48cm around knee 44.3cm below knee 39.3cm   PALPATION: 10/29/21:  pt has ~6 areas on incision that appear to have internal sutures working out. Edema pocket and tenderness proximal incision. Tenderness along incision & joint line.     LOWER EXTREMITY ROM:   ROM P:passive A:active Right Eval 10/29/21 Left Eval 10/29/21  Hip flexion      Hip extension      Hip abduction      Hip adduction      Hip internal rotation      Hip external rotation      Knee flexion   Seated P: 89* Supine  A: 65*  Knee extension   Seated A: -18* Supine P: -9*  Ankle dorsiflexion      Ankle plantarflexion      Ankle inversion      Ankle eversion       (Blank rows = not tested)   LOWER EXTREMITY MMT:   MMT  Right 10/29/21 Left 10/29/21  Hip flexion      Hip extension      Hip abduction      Hip adduction      Hip internal rotation      Hip external rotation      Knee flexion   3-/5  Knee extension   3-/5  Ankle dorsiflexion      Ankle plantarflexion      Ankle inversion      Ankle eversion       (Blank rows = not tested)     GAIT: 10/29/21:  Distance walked: 100' Assistive device utilized: Single point cane Level of assistance: SBA verbal cues Comments: antalgic with decreased stance LLE, left knee flexed in stance & minimal knee flexion in swing.        TODAY'S TREATMENT: 10/30/21 Therapeutic Exercise:  Aerobic: Nustep seat 7 level 5 with BLEs & BUEs for 8 min  Supine: Feet on 55cm ball with strap LLE knee flexion & ext 5 sec hold 15 reps SAQ LLE 2# 10 reps 2 sets Prone:  Seated: LAQ & active knee flexion with contralateral LE opposing motion 10 reps  Standing: heel lifts without UE assist 10 reps.  Neuromuscular Re-education: tandem stance on floor 30 sec LLE in front & in back Manual Therapy:  PROM lt knee flexion & ext with overpressure Therapeutic Activity: Pre-gait stepping LLE over 2" box 10" long and wt shift onto LLE in initial contact & terminal stance. 10 reps. Carryover to gait.  Self Care: Trigger Point Dry Needling:  Modalities: Vaso medium compression left knee 34* 10 min   10/29/21 Therex:         HEP instruction/performance c cues for techniques, handout provided.  Trial set performed of each for comprehension and symptom assessment.  See below for exercise list.  Noted decrease in range for exercise based off symptoms.   Self-care:  Pt reports Dr. Sharol Given recommended purchasing extra large Vivewear sock so top portion will cover her knee. She reported that the top rubbed proximal incision irritating area.  PT donned this compression sock for her with instruction in technique. Even with pulling heal of sock higher than her heal the upper portion of sock did fall at  her proximal incision.  PT used internet to show pt thigh high compression socks thru [employer]. Pt verbalized understanding.        PATIENT EDUCATION:  Education details: Access Code: [id number] Person educated: Patient Education method: Explanation, Demonstration, Tactile cues, Verbal cues,  and Handouts Education comprehension: verbalized understanding, returned demonstration, verbal cues required, tactile cues required, and needs further education     HOME EXERCISE PROGRAM: Access Code: [email or web address] URL: [email or web address] Date: 10/29/2021 Prepared by: Jamey Reas   Exercises - Quad Setting and Stretching  - 2-4 x daily - 7 x weekly - 5-10 sets - 10 reps - prop 5-10 minutes & quad set5 seconds hold - Supine Heel Slide with Strap  - 2-3 x daily - 7 x weekly - 2-3 sets - 10 reps - 5 seconds hold - Supine Straight Leg Raises  - 2-3 x daily - 7 x weekly - 2-3 sets - 10 reps - 5 seconds hold - Seated Knee Flexion Extension AROM   - 2-4 x daily - 7 x weekly - 2-3 sets - 10 reps - 5 seconds hold - Seated straight leg lifts  - 2-3 x daily - 7 x weekly - 2-3 sets - 10 reps - 5 seconds hold - Seated Long Arc Quad  - 2-3 x daily - 7 x weekly - 2 sets - 10 reps - 5 seconds hold - Seated Hamstring Stretch with Strap  - 2-4 x daily - 7 x weekly - 1 sets - 3 reps - 20-30 seconds hold   ASSESSMENT:   CLINICAL IMPRESSION: Patient appears to understand HEP.  Pt improved gait with exercise pre-gait to work on form.  Patient tolerated PT session working on increasing range. She continues to benefit from skilled PT to improve mobility s/p TKR.    OBJECTIVE IMPAIRMENTS Abnormal gait, decreased activity tolerance, decreased balance, decreased knowledge of condition, decreased knowledge of use of DME, decreased mobility, difficulty walking, decreased ROM, decreased strength, increased edema, increased muscle spasms, impaired flexibility, postural dysfunction, and pain.    ACTIVITY  LIMITATIONS carrying, lifting, bending, sitting, standing, squatting, sleeping, stairs, transfers, and locomotion level   PARTICIPATION LIMITATIONS: meal prep, cleaning, laundry, driving, community activity, and occupation   [place] and 1-2 comorbidities: see PMH above  are also affecting patient's functional outcome.    REHAB POTENTIAL: Good   CLINICAL DECISION MAKING: Stable/uncomplicated   EVALUATION COMPLEXITY: Low     GOALS: Goals reviewed with patient? Yes   SHORT TERM GOALS: Target date: 11/29/2021   Patient independent and verbalizes compliance with initial HEP Baseline: SEE OBJECTIVE DATA Goal status: INITIAL   2.  Patient reports 50% improvement in left knee pain. Baseline: SEE OBJECTIVE DATA Goal status: INITIAL   3.  PROM left knee extension -3* to flexion 90* Baseline: SEE OBJECTIVE DATA Goal status: INITIAL   LONG TERM GOALS: Target date: 12/27/2021   Patient will improve FOTO score to 61% Baseline: SEE OBJECTIVE DATA Goal status: INITIAL   2.  Patient reports left knee pain </= 1/10 with standing & gait activities.  Baseline: SEE OBJECTIVE DATA Goal status: INITIAL   3.  Left Knee PROM 0* extension to 100* flexion Baseline: SEE OBJECTIVE DATA Goal status: INITIAL   4.  Left Knee AROM seated -1* extension to 95* flexion Baseline: SEE OBJECTIVE DATA Goal status: INITIAL   5.  Patient ambulates >500' community distances including negotiating ramps, curbs & stairs without device independently. Baseline: SEE OBJECTIVE DATA Goal status: INITIAL   PLAN: PT FREQUENCY: 2-3 x/wk   PT DURATION:  9 weeks   PLANNED INTERVENTIONS: Therapeutic exercises, Therapeutic activity, Neuromuscular re-education, Balance training, Gait training, Patient/Family education, Joint mobilization, Stair training, DME instructions, Electrical stimulation, Cryotherapy, Moist heat, Taping, Vasopneumatic device, and Manual therapy  PLAN FOR NEXT SESSION: continue  therapeutic exercise including Nustep & progress as tolerated, manual therapy, vaso to end   Jamey Reas, PT, DPT 10/30/2021, 3:12 PM

## 2021-11-07 DIAGNOSIS — F331 Major depressive disorder, recurrent, moderate: Secondary | ICD-10-CM | POA: Diagnosis not present

## 2021-11-11 ENCOUNTER — Encounter: Payer: Self-pay | Admitting: Physical Therapy

## 2021-11-11 ENCOUNTER — Ambulatory Visit: Payer: Medicare Other | Admitting: Physical Therapy

## 2021-11-11 ENCOUNTER — Other Ambulatory Visit: Payer: Self-pay | Admitting: Orthopedic Surgery

## 2021-11-11 DIAGNOSIS — M25662 Stiffness of left knee, not elsewhere classified: Secondary | ICD-10-CM

## 2021-11-11 DIAGNOSIS — M6281 Muscle weakness (generalized): Secondary | ICD-10-CM

## 2021-11-11 DIAGNOSIS — R2681 Unsteadiness on feet: Secondary | ICD-10-CM

## 2021-11-11 DIAGNOSIS — M25562 Pain in left knee: Secondary | ICD-10-CM | POA: Diagnosis not present

## 2021-11-11 DIAGNOSIS — R600 Localized edema: Secondary | ICD-10-CM | POA: Diagnosis not present

## 2021-11-11 DIAGNOSIS — R2689 Other abnormalities of gait and mobility: Secondary | ICD-10-CM

## 2021-11-11 MED ORDER — METHOCARBAMOL 500 MG PO TABS: 500.0000 mg | ORAL_TABLET | Freq: Three times a day (TID) | ORAL | 0 refills | Status: DC | PRN

## 2021-11-11 NOTE — Telephone Encounter (Signed)
Muscle is cramping at night only --when she trys to relax --she is missing sleep due to this    Can a muscle relaxer  be called in pt has appt on Thursday 6-8

## 2021-11-11 NOTE — Therapy (Signed)
OUTPATIENT PHYSICAL THERAPY TREATMENT NOTE   Patient Name: Tara Davis MRN: [id number] DOB:[birth year], 76 y.o., female Today's Date: 11/11/2021  PCP: Glendale Chard, MD REFERRING PROVIDER: Suzan Slick, NP  END OF SESSION:   PT End of Session - 11/11/21 1304     Visit Number 3    Number of Visits 20    Date for PT Re-Evaluation 12/30/21    Authorization Type BCBS Medicare    Authorization Time Period $10 COPAY  Deductible ($0.00)Deductible has been met  Out-of-Pocket Limit ($3,950.00) $455.62 Paid  $3,494.38 to go    Progress Note Due on Visit 10    PT Start Time 1300    PT Stop Time 1352    PT Time Calculation (min) 52 min    Activity Tolerance Patient tolerated treatment well;Patient limited by fatigue;Patient limited by pain    Behavior During Therapy WFL for tasks assessed/performed              Past Medical History:  Diagnosis Date   Adenomatous colon polyp    Anxiety    Arthritis    Bilateral cataracts    within 6 mos.   Depression    Diverticulosis    Fatty liver    GERD (gastroesophageal reflux disease)    H. pylori infection    Hyperlipidemia    Ulcer 1988   Uterine fibroid    Past Surgical History:  Procedure Laterality Date   APPENDECTOMY     CATARACT EXTRACTION, BILATERAL     CHOLECYSTECTOMY     COLONOSCOPY     spontaneous vaginal delivery     x2   TONSILLECTOMY     TOTAL KNEE ARTHROPLASTY Left 10/11/2021   Procedure: LEFT TOTAL KNEE ARTHROPLASTY;  Surgeon: Newt Minion, MD;  Location: [facility];  Service: Orthopedics;  Laterality: Left;   WISDOM TOOTH EXTRACTION     Patient Active Problem List   Diagnosis Date Noted   Total knee replacement status, left 10/11/2021   Unilateral primary osteoarthritis, left knee    Personal history of colonic polyps 08/07/2020   Gastroesophageal reflux disease 08/23/2019   Tobacco abuse 01/12/2018   Cigarette smoker 01/12/2018   IBS 07/05/2008    REFERRING DIAG: M17.12 (ICD-10-CM) - Unilateral primary  osteoarthritis, left knee  THERAPY DIAG:  Stiffness of left knee, not elsewhere classified  Acute pain of left knee  Muscle weakness (generalized)  Localized edema  Unsteadiness on feet  Other abnormalities of gait and mobility  Rationale for Evaluation and Treatment Rehabilitation  PERTINENT HISTORY: OA, Left TKR 10/11/21, anxiety, depresssion, IBS diverticulosis  PRECAUTIONS: None  SUBJECTIVE: She did her exercises but was not able to get into PT.   PAIN:  Are you having pain? Yes: NPRS scale: 3/10 over last week ranging 0/10 - 7/10 Pain location: left knee & ankle Pain description: dull Aggravating factors: first standing on LLE, getting up Relieving factors: ice, heat   OBJECTIVE: (objective measures completed at initial evaluation unless otherwise dated)  OBJECTIVE:    DIAGNOSTIC FINDINGS: 10/24/2021: 2 view radiographs of the left knee shows a stable total knee arthroplasty  no complicating features.   PATIENT SURVEYS:  FOTO 10/29/21:  43% and target 61%   COGNITION: 10/29/21:  Overall cognitive status: Within functional limits for tasks assessed                        POSTURE: 10/29/21:  weight shift right   EDEMA: 10/29/21:  RLE:  above knee  42.3cm around knee 40.3cm below knee  34.8cm 10/29/21:  LLE:  above knee 48cm around knee 44.3cm below knee 39.3cm   PALPATION: 10/29/21:  pt has ~6 areas on incision that appear to have internal sutures working out. Edema pocket and tenderness proximal incision. Tenderness along incision & joint line.     LOWER EXTREMITY ROM:   ROM P:passive A:active Right Eval 10/29/21 Left Eval 10/29/21 Left 11/11/21  Hip flexion       Hip extension       Hip abduction       Hip adduction       Hip internal rotation       Hip external rotation       Knee flexion   Seated P: 89* Supine  A: 65* Supine P: 94*  Knee extension   Seated A: -18* Supine P: -9*   Ankle dorsiflexion       Ankle plantarflexion       Ankle  inversion       Ankle eversion        (Blank rows = not tested)   LOWER EXTREMITY MMT:   MMT Right 10/29/21 Left 10/29/21  Hip flexion      Hip extension      Hip abduction      Hip adduction      Hip internal rotation      Hip external rotation      Knee flexion   3-/5  Knee extension   3-/5  Ankle dorsiflexion      Ankle plantarflexion      Ankle inversion      Ankle eversion       (Blank rows = not tested)     GAIT: 10/29/21:  Distance walked: 100' Assistive device utilized: Single point cane Level of assistance: SBA verbal cues Comments: antalgic with decreased stance LLE, left knee flexed in stance & minimal knee flexion in swing.        TODAY'S TREATMENT: 11/11/2021 Therapeutic Exercise:  Aerobic: SciFit recumbent bike seat 9 rocking back/forth knee flexion stretch for 4 min then 2 min full revolutions backwards then full revolutions forward level 1 for 2 min  for 8 min total. Supine: hip flexor stretch hooklying LE over edge 30 sec then increased stretch with right knee to chest LLE over edge 30 sec hold.  Quad stretch  hooklying LE over edge strap knee flexion 30 sec 2 reps. Prone:  Seated: LAQ 2# & active knee flexion with contralateral LE opposing motion 10 reps 2 sets  Standing: gastroc stretch step heel depression 30 sec hold 2 reps heel lifts without UE assist 10 reps.  Neuromuscular Re-education: tandem stance on floor 30 sec LLE in front & in back Manual Therapy:  PROM lt knee flexion & ext with overpressure Therapeutic Activity: Leg Press BLEs 100# 10 reps 2 sets 5 sec hold flex & ext  Sit to/from stand with light UE touch but not using UEs to lower 10 reps. PT demo & verbal cues on technique Self Care: Trigger Point Dry Needling:  Modalities: Vaso medium compression left knee 34* 10 min  10/30/21 Therapeutic Exercise:  Aerobic: Nustep seat 7 level 5 with BLEs & BUEs for 8 min  Supine: Feet on 55cm ball with strap LLE knee flexion & ext 5 sec hold 15  reps SAQ LLE 2# 10 reps 2 sets Prone:  Seated: LAQ & active knee flexion with contralateral LE opposing motion 10 reps  Standing: heel lifts without UE  assist 10 reps.  Neuromuscular Re-education: tandem stance on floor 30 sec LLE in front & in back Manual Therapy:  PROM lt knee flexion & ext with overpressure Therapeutic Activity: Pre-gait stepping LLE over 2" box 10" long and wt shift onto LLE in initial contact & terminal stance. 10 reps. Carryover to gait.  Self Care: Trigger Point Dry Needling:  Modalities: Vaso medium compression left knee 34* 10 min   10/29/21 Therex:         HEP instruction/performance c cues for techniques, handout provided.  Trial set performed of each for comprehension and symptom assessment.  See below for exercise list.  Noted decrease in range for exercise based off symptoms.   Self-care:  Pt reports Dr. Sharol Given recommended purchasing extra large Vivewear sock so top portion will cover her knee. She reported that the top rubbed proximal incision irritating area.  PT donned this compression sock for her with instruction in technique. Even with pulling heal of sock higher than her heal the upper portion of sock did fall at her proximal incision.  PT used internet to show pt thigh high compression socks thru [employer]. Pt verbalized understanding.        PATIENT EDUCATION:  Education details: Access Code: [email or web address] Person educated: Patient Education method: Explanation, Demonstration, Tactile cues, Verbal cues, and Handouts Education comprehension: verbalized understanding, returned demonstration, verbal cues required, tactile cues required, and needs further education     HOME EXERCISE PROGRAM: Access Code: [email or web address] URL: [email or web address] Date: 10/29/2021 Prepared by: Jamey Reas   Exercises - Quad Setting and Stretching  - 2-4 x daily - 7 x weekly - 5-10 sets - 10 reps - prop 5-10 minutes & quad set5 seconds hold - Supine Heel  Slide with Strap  - 2-3 x daily - 7 x weekly - 2-3 sets - 10 reps - 5 seconds hold - Supine Straight Leg Raises  - 2-3 x daily - 7 x weekly - 2-3 sets - 10 reps - 5 seconds hold - Seated Knee Flexion Extension AROM   - 2-4 x daily - 7 x weekly - 2-3 sets - 10 reps - 5 seconds hold - Seated straight leg lifts  - 2-3 x daily - 7 x weekly - 2-3 sets - 10 reps - 5 seconds hold - Seated Long Arc Quad  - 2-3 x daily - 7 x weekly - 2 sets - 10 reps - 5 seconds hold - Seated Hamstring Stretch with Strap  - 2-4 x daily - 7 x weekly - 1 sets - 3 reps - 20-30 seconds hold   ASSESSMENT:   CLINICAL IMPRESSION: PT progressed exercises with functional components which she tolerated well.  Her range seems to be improving.  Pt is on target to meet STGs with skilled PT interventions.    OBJECTIVE IMPAIRMENTS Abnormal gait, decreased activity tolerance, decreased balance, decreased knowledge of condition, decreased knowledge of use of DME, decreased mobility, difficulty walking, decreased ROM, decreased strength, increased edema, increased muscle spasms, impaired flexibility, postural dysfunction, and pain.    ACTIVITY LIMITATIONS carrying, lifting, bending, sitting, standing, squatting, sleeping, stairs, transfers, and locomotion level   PARTICIPATION LIMITATIONS: meal prep, cleaning, laundry, driving, community activity, and occupation   [place] and 1-2 comorbidities: see PMH above  are also affecting patient's functional outcome.    REHAB POTENTIAL: Good   CLINICAL DECISION MAKING: Stable/uncomplicated   EVALUATION COMPLEXITY: Low     GOALS: Goals reviewed with patient? Yes   SHORT TERM GOALS:  Target date: 11/29/2021   Patient independent and verbalizes compliance with initial HEP Baseline: SEE OBJECTIVE DATA Goal status: INITIAL   2.  Patient reports 50% improvement in left knee pain. Baseline: SEE OBJECTIVE DATA Goal status: INITIAL   3.  PROM left knee extension -3* to  flexion 90* Baseline: SEE OBJECTIVE DATA Goal status: INITIAL   LONG TERM GOALS: Target date: 12/27/2021   Patient will improve FOTO score to 61% Baseline: SEE OBJECTIVE DATA Goal status: INITIAL   2.  Patient reports left knee pain </= 1/10 with standing & gait activities.  Baseline: SEE OBJECTIVE DATA Goal status: INITIAL   3.  Left Knee PROM 0* extension to 100* flexion Baseline: SEE OBJECTIVE DATA Goal status: INITIAL   4.  Left Knee AROM seated -1* extension to 95* flexion Baseline: SEE OBJECTIVE DATA Goal status: INITIAL   5.  Patient ambulates >500' community distances including negotiating ramps, curbs & stairs without device independently. Baseline: SEE OBJECTIVE DATA Goal status: INITIAL   PLAN: PT FREQUENCY: 2-3 x/wk   PT DURATION:  9 weeks   PLANNED INTERVENTIONS: Therapeutic exercises, Therapeutic activity, Neuromuscular re-education, Balance training, Gait training, Patient/Family education, Joint mobilization, Stair training, DME instructions, Electrical stimulation, Cryotherapy, Moist heat, Taping, Vasopneumatic device, and Manual therapy   PLAN FOR NEXT SESSION: therapeutic exercise including SciFit bike, leg press & standing including some balance, manual therapy, vaso to end   Jamey Reas, PT, DPT 11/11/2021, 4:44 PM

## 2021-11-11 NOTE — Telephone Encounter (Signed)
Pt informed

## 2021-11-11 NOTE — Telephone Encounter (Signed)
S/p left total knee replacement 10/11/21, is taking hydrocodone PRN.

## 2021-11-13 ENCOUNTER — Ambulatory Visit: Payer: Medicare Other | Admitting: Physical Therapy

## 2021-11-13 ENCOUNTER — Encounter: Payer: Self-pay | Admitting: Physical Therapy

## 2021-11-13 DIAGNOSIS — M25562 Pain in left knee: Secondary | ICD-10-CM

## 2021-11-13 DIAGNOSIS — M6281 Muscle weakness (generalized): Secondary | ICD-10-CM

## 2021-11-13 DIAGNOSIS — M25662 Stiffness of left knee, not elsewhere classified: Secondary | ICD-10-CM | POA: Diagnosis not present

## 2021-11-13 DIAGNOSIS — R600 Localized edema: Secondary | ICD-10-CM | POA: Diagnosis not present

## 2021-11-13 DIAGNOSIS — R2681 Unsteadiness on feet: Secondary | ICD-10-CM

## 2021-11-13 DIAGNOSIS — R2689 Other abnormalities of gait and mobility: Secondary | ICD-10-CM

## 2021-11-13 NOTE — Therapy (Signed)
OUTPATIENT PHYSICAL THERAPY TREATMENT NOTE   Patient Name: Tara Davis MRN: [id number] DOB:01/10/1946, 76 y.o., female Today's Date: 11/13/2021  PCP: Glendale Chard, MD REFERRING PROVIDER: Suzan Slick, NP  END OF SESSION:   PT End of Session - 11/13/21 1303     Visit Number 4    Number of Visits 20    Date for PT Re-Evaluation 12/30/21    Authorization Type BCBS Medicare    Authorization Time Period $10 COPAY  Deductible ($0.00)Deductible has been met  Out-of-Pocket Limit ($3,950.00) $455.62 Paid  $3,494.38 to go    Progress Note Due on Visit 10    PT Start Time 1300    PT Stop Time 1353    PT Time Calculation (min) 53 min    Activity Tolerance Patient tolerated treatment well;Patient limited by fatigue;Patient limited by pain    Behavior During Therapy WFL for tasks assessed/performed               Past Medical History:  Diagnosis Date   Adenomatous colon polyp    Anxiety    Arthritis    Bilateral cataracts    within 6 mos.   Depression    Diverticulosis    Fatty liver    GERD (gastroesophageal reflux disease)    H. pylori infection    Hyperlipidemia    Ulcer 1988   Uterine fibroid    Past Surgical History:  Procedure Laterality Date   APPENDECTOMY     CATARACT EXTRACTION, BILATERAL     CHOLECYSTECTOMY     COLONOSCOPY     spontaneous vaginal delivery     x2   TONSILLECTOMY     TOTAL KNEE ARTHROPLASTY Left 10/11/2021   Procedure: LEFT TOTAL KNEE ARTHROPLASTY;  Surgeon: Newt Minion, MD;  Location: [facility];  Service: Orthopedics;  Laterality: Left;   WISDOM TOOTH EXTRACTION     Patient Active Problem List   Diagnosis Date Noted   Total knee replacement status, left 10/11/2021   Unilateral primary osteoarthritis, left knee    Personal history of colonic polyps 08/07/2020   Gastroesophageal reflux disease 08/23/2019   Tobacco abuse 01/12/2018   Cigarette smoker 01/12/2018   IBS 07/05/2008    REFERRING DIAG: M17.12 (ICD-10-CM) - Unilateral primary  osteoarthritis, left knee  THERAPY DIAG:  Stiffness of left knee, not elsewhere classified  Acute pain of left knee  Muscle weakness (generalized)  Localized edema  Unsteadiness on feet  Other abnormalities of gait and mobility  Rationale for Evaluation and Treatment Rehabilitation  PERTINENT HISTORY: OA, Left TKR 10/11/21, anxiety, depresssion, IBS diverticulosis  PRECAUTIONS: None  SUBJECTIVE: She is noting improvement in knee movement.   PAIN:  Are you having pain? Yes: NPRS scale:  0/10 arrival to PT & over last week ranging 0/10 - 7/10 Pain location: left knee & ankle Pain description: dull Aggravating factors: first standing on LLE, getting up Relieving factors: ice, heat   OBJECTIVE: (objective measures completed at initial evaluation unless otherwise dated)  OBJECTIVE:    DIAGNOSTIC FINDINGS: 10/24/2021: 2 view radiographs of the left knee shows a stable total knee arthroplasty  no complicating features.   PATIENT SURVEYS:  FOTO 10/29/21:  43% and target 61%   COGNITION: 10/29/21:  Overall cognitive status: Within functional limits for tasks assessed                        POSTURE: 10/29/21:  weight shift right   EDEMA: 10/29/21:  RLE:  above  knee 42.3cm around knee 40.3cm below knee  34.8cm 10/29/21:  LLE:  above knee 48cm around knee 44.3cm below knee 39.3cm   PALPATION: 10/29/21:  pt has ~6 areas on incision that appear to have internal sutures working out. Edema pocket and tenderness proximal incision. Tenderness along incision & joint line.     LOWER EXTREMITY ROM:   ROM P:passive A:active Right Eval 10/29/21 Left Eval 10/29/21 Left 11/11/21  Hip flexion       Hip extension       Hip abduction       Hip adduction       Hip internal rotation       Hip external rotation       Knee flexion   Seated P: 89* Supine  A: 65* Supine P: 94*  Knee extension   Seated A: -18* Supine P: -9*   Ankle dorsiflexion       Ankle plantarflexion       Ankle  inversion       Ankle eversion        (Blank rows = not tested)   LOWER EXTREMITY MMT:   MMT Right 10/29/21 Left 10/29/21  Hip flexion      Hip extension      Hip abduction      Hip adduction      Hip internal rotation      Hip external rotation      Knee flexion   3-/5  Knee extension   3-/5  Ankle dorsiflexion      Ankle plantarflexion      Ankle inversion      Ankle eversion       (Blank rows = not tested)     GAIT: 10/29/21:  Distance walked: 100' Assistive device utilized: Single point cane Level of assistance: SBA verbal cues Comments: antalgic with decreased stance LLE, left knee flexed in stance & minimal knee flexion in swing.        TODAY'S TREATMENT: 11/13/2021 Therapeutic Exercise:  Aerobic: SciFit recumbent bike LEs only seat 9 rocking back/forth knee flexion stretch for 2 min then full revolutions forward level 1 for 6 min for 8 min total. Supine: hamstring stretch bw reps leg press SLR strap 30 sec hold 2 reps.  Prone:  Seated:   Standing: knee flexion stretch with LLE on 2nd step of stairs forward wt shift 10 sec hold 10 reps. Active lt knee flexion tapping 2nd step 10 reps Gastroc stretch step heel depression 30 sec hold 2 reps  Neuromuscular Re-education: tandem stance on foam 30 sec LLE in front & in back 2 reps ea.  LLE SLS on foam with intermittent touch //bars 10 sec 5 reps. Manual Therapy:  PROM lt knee flexion & ext with overpressure Therapeutic Activity: Leg Press BLEs 106# 15 reps 2 sets 5 sec hold flex & ext  Stairs 11 steps 7" tall descend step to eccentric LLE with right rail & Lt HHA and ascend alternating focus on stance press LLE & swing flexion with left rail (same as her home). Verbal cues only.  Self Care: Trigger Point Dry Needling:  Modalities: Vaso medium compression left knee 34* 10 min  11/11/2021 Therapeutic Exercise:  Aerobic: SciFit recumbent bike seat 9 rocking back/forth knee flexion stretch for 4 min then 2 min full  revolutions backwards then full revolutions forward level 1 for 2 min  for 8 min total. Supine: hip flexor stretch hooklying LE over edge 30 sec then increased stretch with right  knee to chest LLE over edge 30 sec hold.  Quad stretch  hooklying LE over edge strap knee flexion 30 sec 2 reps. Prone:  Seated: LAQ 2# & active knee flexion with contralateral LE opposing motion 10 reps 2 sets  Standing: gastroc stretch step heel depression 30 sec hold 2 reps heel lifts without UE assist 10 reps.  Neuromuscular Re-education: tandem stance on floor 30 sec LLE in front & in back 2 reps ea Manual Therapy:  PROM lt knee flexion & ext with overpressure Therapeutic Activity: Leg Press BLEs 100# 10 reps 2 sets 5 sec hold flex & ext  Sit to/from stand with light UE touch but not using UEs to lower 10 reps. PT demo & verbal cues on technique Self Care: Trigger Point Dry Needling:  Modalities: Vaso medium compression left knee 34* 10 min  10/30/21 Therapeutic Exercise:  Aerobic: Nustep seat 7 level 5 with BLEs & BUEs for 8 min  Supine: Feet on 55cm ball with strap LLE knee flexion & ext 5 sec hold 15 reps SAQ LLE 2# 10 reps 2 sets Prone:  Seated: LAQ & active knee flexion with contralateral LE opposing motion 10 reps  Standing: heel lifts without UE assist 10 reps.  Neuromuscular Re-education: tandem stance on floor 30 sec LLE in front & in back Manual Therapy:  PROM lt knee flexion & ext with overpressure Therapeutic Activity: Pre-gait stepping LLE over 2" box 10" long and wt shift onto LLE in initial contact & terminal stance. 10 reps. Carryover to gait.  Self Care: Trigger Point Dry Needling:  Modalities: Vaso medium compression left knee 34* 10 min     HOME EXERCISE PROGRAM: Access Code: [email or web address] URL: [email or web address] Date: 10/29/2021 Prepared by: Jamey Reas   Exercises - Quad Setting and Stretching  - 2-4 x daily - 7 x weekly - 5-10 sets - 10 reps - prop 5-10 minutes &  quad set5 seconds hold - Supine Heel Slide with Strap  - 2-3 x daily - 7 x weekly - 2-3 sets - 10 reps - 5 seconds hold - Supine Straight Leg Raises  - 2-3 x daily - 7 x weekly - 2-3 sets - 10 reps - 5 seconds hold - Seated Knee Flexion Extension AROM   - 2-4 x daily - 7 x weekly - 2-3 sets - 10 reps - 5 seconds hold - Seated straight leg lifts  - 2-3 x daily - 7 x weekly - 2-3 sets - 10 reps - 5 seconds hold - Seated Long Arc Quad  - 2-3 x daily - 7 x weekly - 2 sets - 10 reps - 5 seconds hold - Seated Hamstring Stretch with Strap  - 2-4 x daily - 7 x weekly - 1 sets - 3 reps - 20-30 seconds hold   ASSESSMENT:   CLINICAL IMPRESSION: Patient appears to understand progression of function on stairs to work at home some.  She is tolerating more activities and pain is slowly decreasing.  Pt continues to benefit from skilled PT to progress mobility.    OBJECTIVE IMPAIRMENTS Abnormal gait, decreased activity tolerance, decreased balance, decreased knowledge of condition, decreased knowledge of use of DME, decreased mobility, difficulty walking, decreased ROM, decreased strength, increased edema, increased muscle spasms, impaired flexibility, postural dysfunction, and pain.    ACTIVITY LIMITATIONS carrying, lifting, bending, sitting, standing, squatting, sleeping, stairs, transfers, and locomotion level   PARTICIPATION LIMITATIONS: meal prep, cleaning, laundry, driving, community activity, and occupation   [place] and  1-2 comorbidities: see PMH above  are also affecting patient's functional outcome.    REHAB POTENTIAL: Good   CLINICAL DECISION MAKING: Stable/uncomplicated   EVALUATION COMPLEXITY: Low     GOALS: Goals reviewed with patient? Yes   SHORT TERM GOALS: Target date: 11/29/2021   Patient independent and verbalizes compliance with initial HEP Baseline: SEE OBJECTIVE DATA Goal status: INITIAL   2.  Patient reports 50% improvement in left knee pain. Baseline: SEE  OBJECTIVE DATA Goal status: INITIAL   3.  PROM left knee extension -3* to flexion 90* Baseline: SEE OBJECTIVE DATA Goal status: INITIAL   LONG TERM GOALS: Target date: 12/27/2021   Patient will improve FOTO score to 61% Baseline: SEE OBJECTIVE DATA Goal status: INITIAL   2.  Patient reports left knee pain </= 1/10 with standing & gait activities.  Baseline: SEE OBJECTIVE DATA Goal status: INITIAL   3.  Left Knee PROM 0* extension to 100* flexion Baseline: SEE OBJECTIVE DATA Goal status: INITIAL   4.  Left Knee AROM seated -1* extension to 95* flexion Baseline: SEE OBJECTIVE DATA Goal status: INITIAL   5.  Patient ambulates >500' community distances including negotiating ramps, curbs & stairs without device independently. Baseline: SEE OBJECTIVE DATA Goal status: INITIAL   PLAN: PT FREQUENCY: 2-3 x/wk   PT DURATION:  9 weeks   PLANNED INTERVENTIONS: Therapeutic exercises, Therapeutic activity, Neuromuscular re-education, Balance training, Gait training, Patient/Family education, Joint mobilization, Stair training, DME instructions, Electrical stimulation, Cryotherapy, Moist heat, Taping, Vasopneumatic device, and Manual therapy   PLAN FOR NEXT SESSION:  continue therapeutic exercise including SciFit bike, leg press & standing including some balance, manual therapy, vaso to end   Jamey Reas, PT, DPT 11/13/2021, 3:23 PM

## 2021-11-14 ENCOUNTER — Encounter: Payer: Medicare Other | Admitting: Orthopedic Surgery

## 2021-11-15 ENCOUNTER — Encounter: Payer: Self-pay | Admitting: Rehabilitative and Restorative Service Providers"

## 2021-11-15 ENCOUNTER — Ambulatory Visit: Payer: Medicare Other | Admitting: Rehabilitative and Restorative Service Providers"

## 2021-11-15 DIAGNOSIS — M25662 Stiffness of left knee, not elsewhere classified: Secondary | ICD-10-CM | POA: Diagnosis not present

## 2021-11-15 DIAGNOSIS — M25562 Pain in left knee: Secondary | ICD-10-CM | POA: Diagnosis not present

## 2021-11-15 DIAGNOSIS — R2689 Other abnormalities of gait and mobility: Secondary | ICD-10-CM

## 2021-11-15 DIAGNOSIS — M6281 Muscle weakness (generalized): Secondary | ICD-10-CM

## 2021-11-15 DIAGNOSIS — R600 Localized edema: Secondary | ICD-10-CM | POA: Diagnosis not present

## 2021-11-15 DIAGNOSIS — R2681 Unsteadiness on feet: Secondary | ICD-10-CM

## 2021-11-15 NOTE — Therapy (Signed)
OUTPATIENT PHYSICAL THERAPY TREATMENT NOTE   Patient Name: Tara Davis MRN: [id number] DOB:1945/12/11, 76 y.o., female Today's Date: 11/15/2021  PCP: Glendale Chard, MD REFERRING PROVIDER: Suzan Slick, NP  END OF SESSION:   PT End of Session - 11/15/21 1256     Visit Number 5    Number of Visits 20    Date for PT Re-Evaluation 12/30/21    Authorization Type BCBS Medicare    Authorization Time Period $10 COPAY  Deductible ($0.00)Deductible has been met  Out-of-Pocket Limit ($3,950.00) $455.62 Paid  $3,494.38 to go    Progress Note Due on Visit 10    PT Start Time 1256    PT Stop Time 1353    PT Time Calculation (min) 57 min    Activity Tolerance Patient tolerated treatment well;Patient limited by fatigue;Patient limited by pain    Behavior During Therapy WFL for tasks assessed/performed                Past Medical History:  Diagnosis Date   Adenomatous colon polyp    Anxiety    Arthritis    Bilateral cataracts    within 6 mos.   Depression    Diverticulosis    Fatty liver    GERD (gastroesophageal reflux disease)    H. pylori infection    Hyperlipidemia    Ulcer 1988   Uterine fibroid    Past Surgical History:  Procedure Laterality Date   APPENDECTOMY     CATARACT EXTRACTION, BILATERAL     CHOLECYSTECTOMY     COLONOSCOPY     spontaneous vaginal delivery     x2   TONSILLECTOMY     TOTAL KNEE ARTHROPLASTY Left 10/11/2021   Procedure: LEFT TOTAL KNEE ARTHROPLASTY;  Surgeon: Newt Minion, MD;  Location: [facility];  Service: Orthopedics;  Laterality: Left;   WISDOM TOOTH EXTRACTION     Patient Active Problem List   Diagnosis Date Noted   Total knee replacement status, left 10/11/2021   Unilateral primary osteoarthritis, left knee    Personal history of colonic polyps 08/07/2020   Gastroesophageal reflux disease 08/23/2019   Tobacco abuse 01/12/2018   Cigarette smoker 01/12/2018   IBS 07/05/2008    REFERRING DIAG: M17.12 (ICD-10-CM) - Unilateral  primary osteoarthritis, left knee  THERAPY DIAG:  Stiffness of left knee, not elsewhere classified  Acute pain of left knee  Muscle weakness (generalized)  Localized edema  Unsteadiness on feet  Other abnormalities of gait and mobility  Rationale for Evaluation and Treatment Rehabilitation  PERTINENT HISTORY: OA, Left TKR 10/11/21, anxiety, depresssion, IBS diverticulosis  PRECAUTIONS: None  SUBJECTIVE:  Pt indicated feeling sore and increase in symptoms in day or so after last visit.   PAIN:  NPRS scale:  2/10 current, 7/10 at worst in last 24 hours Pain location: left knee & ankle Pain description: dull Aggravating factors: first standing on LLE, getting up Relieving factors: ice, heat   OBJECTIVE: (objective measures completed at initial evaluation unless otherwise dated)   DIAGNOSTIC FINDINGS: 10/24/2021: 2 view radiographs of the left knee shows a stable total knee arthroplasty  no complicating features.   PATIENT SURVEYS:  10/29/21: FOTO   43% and target 61%   COGNITION: 10/29/21:  Overall cognitive status: Within functional limits for tasks assessed                        POSTURE: 10/29/21:  weight shift right   EDEMA: 10/29/21:  RLE:  above  knee 42.3cm around knee 40.3cm below knee  34.8cm 10/29/21:  LLE:  above knee 48cm around knee 44.3cm below knee 39.3cm   PALPATION: 10/29/21:  pt has ~6 areas on incision that appear to have internal sutures working out. Edema pocket and tenderness proximal incision. Tenderness along incision & joint line.     LOWER EXTREMITY ROM:   ROM P:passive A:active Right Eval 10/29/21 Left Eval 10/29/21 Left 11/11/21  Hip flexion       Hip extension       Hip abduction       Hip adduction       Hip internal rotation       Hip external rotation       Knee flexion   Seated P: 89* Supine  A: 65* Supine P: 94*  Knee extension   Seated A: -18* Supine P: -9*   Ankle dorsiflexion       Ankle plantarflexion       Ankle  inversion       Ankle eversion        (Blank rows = not tested)   LOWER EXTREMITY MMT:   MMT Right 10/29/21 Left 10/29/21  Hip flexion      Hip extension      Hip abduction      Hip adduction      Hip internal rotation      Hip external rotation      Knee flexion   3-/5  Knee extension   3-/5  Ankle dorsiflexion      Ankle plantarflexion      Ankle inversion      Ankle eversion       (Blank rows = not tested)     GAIT: 11/15/2021:  independent ambulation in clinic today (reported no cane use at home)  10/29/21:  Distance walked: 100' Assistive device utilized: Single point cane Level of assistance: SBA verbal cues Comments: antalgic with decreased stance LLE, left knee flexed in stance & minimal knee flexion in swing.      TODAY'S TREATMENT: 11/15/2021 Therapeutic Exercise:  Nustep Lvl 5 UE/LE 8 mins  Seated Lt leg LAQ 3 lbs 2 x 15 c pause in end range, contralateral movement opposite Seated Lt quad set 5 sec hold x 10  Sit to stand to sit 20 inch table no UE assist x 10 Supine heel prop c vaso to tolerance  Neuromuscular Re-education:   Church pew anterior/posterior ankle strategy weight shift 2 mins  Retro step x 15 Lt posterior  Manual Therapy:  Seated Lt knee flexion mobilization c movement Ir/distraction c contral   Modalities: Vaso medium compression left knee 34* 10 min  11/13/2021 Therapeutic Exercise:  Aerobic: [employer] recumbent bike LEs only seat 9 rocking back/forth knee flexion stretch for 2 min then full revolutions forward level 1 for 6 min for 8 min total. Supine: hamstring stretch bw reps leg press SLR strap 30 sec hold 2 reps.  Prone:  Seated:   Standing: knee flexion stretch with LLE on 2nd step of stairs forward wt shift 10 sec hold 10 reps. Active lt knee flexion tapping 2nd step 10 reps Gastroc stretch step heel depression 30 sec hold 2 reps  Neuromuscular Re-education: tandem stance on foam 30 sec LLE in front & in back 2 reps ea.  LLE SLS on  foam with intermittent touch //bars 10 sec 5 reps. Manual Therapy:  PROM lt knee flexion & ext with overpressure Therapeutic Activity: Leg Press BLEs 106# 15 reps  2 sets 5 sec hold flex & ext  Stairs 11 steps 7" tall descend step to eccentric LLE with right rail & Lt HHA and ascend alternating focus on stance press LLE & swing flexion with left rail (same as her home). Verbal cues only.  Self Care: Trigger Point Dry Needling:  Modalities: Vaso medium compression left knee 34* 10 min  11/11/2021 Therapeutic Exercise:  Aerobic: [employer] recumbent bike seat 9 rocking back/forth knee flexion stretch for 4 min then 2 min full revolutions backwards then full revolutions forward level 1 for 2 min  for 8 min total. Supine: hip flexor stretch hooklying LE over edge 30 sec then increased stretch with right knee to chest LLE over edge 30 sec hold.  Quad stretch  hooklying LE over edge strap knee flexion 30 sec 2 reps. Prone:  Seated: LAQ 2# & active knee flexion with contralateral LE opposing motion 10 reps 2 sets  Standing: gastroc stretch step heel depression 30 sec hold 2 reps heel lifts without UE assist 10 reps.  Neuromuscular Re-education: tandem stance on floor 30 sec LLE in front & in back 2 reps ea Manual Therapy:  PROM lt knee flexion & ext with overpressure Therapeutic Activity: Leg Press BLEs 100# 10 reps 2 sets 5 sec hold flex & ext  Sit to/from stand with light UE touch but not using UEs to lower 10 reps. PT demo & verbal cues on technique Self Care: Trigger Point Dry Needling:  Modalities: Vaso medium compression left knee 34* 10 min    HOME EXERCISE PROGRAM: Access Code: [email or web address] URL: [email or web address] Date: 10/29/2021 Prepared by: Jamey Reas   Exercises - Quad Setting and Stretching  - 2-4 x daily - 7 x weekly - 5-10 sets - 10 reps - prop 5-10 minutes & quad set5 seconds hold - Supine Heel Slide with Strap  - 2-3 x daily - 7 x weekly - 2-3 sets - 10 reps - 5  seconds hold - Supine Straight Leg Raises  - 2-3 x daily - 7 x weekly - 2-3 sets - 10 reps - 5 seconds hold - Seated Knee Flexion Extension AROM   - 2-4 x daily - 7 x weekly - 2-3 sets - 10 reps - 5 seconds hold - Seated straight leg lifts  - 2-3 x daily - 7 x weekly - 2-3 sets - 10 reps - 5 seconds hold - Seated Long Arc Quad  - 2-3 x daily - 7 x weekly - 2 sets - 10 reps - 5 seconds hold - Seated Hamstring Stretch with Strap  - 2-4 x daily - 7 x weekly - 1 sets - 3 reps - 20-30 seconds hold   ASSESSMENT:   CLINICAL IMPRESSION: Held leg press and some standing activity today due to complaints indicated after last visit (plan to resume in future as appropriate).   Quad strength and extension mobility gains to benefit Pt greatly going forward in skilled PT services.    OBJECTIVE IMPAIRMENTS Abnormal gait, decreased activity tolerance, decreased balance, decreased knowledge of condition, decreased knowledge of use of DME, decreased mobility, difficulty walking, decreased ROM, decreased strength, increased edema, increased muscle spasms, impaired flexibility, postural dysfunction, and pain.    ACTIVITY LIMITATIONS carrying, lifting, bending, sitting, standing, squatting, sleeping, stairs, transfers, and locomotion level   PARTICIPATION LIMITATIONS: meal prep, cleaning, laundry, driving, community activity, and occupation   [place] and 1-2 comorbidities: see PMH above  are also affecting patient's functional outcome.    REHAB  POTENTIAL: Good   CLINICAL DECISION MAKING: Stable/uncomplicated   EVALUATION COMPLEXITY: Low     GOALS: Goals reviewed with patient? Yes   SHORT TERM GOALS: Target date: 11/29/2021   Patient independent and verbalizes compliance with initial HEP Baseline: SEE OBJECTIVE DATA Goal status: on going - assessed 11/15/2021   2.  Patient reports 50% improvement in left knee pain. Baseline: SEE OBJECTIVE DATA Goal status: on going - assessed 11/15/2021    3.  PROM left knee extension -3* to flexion 90* Baseline: SEE OBJECTIVE DATA Goal status: on going - assessed 11/15/2021   LONG TERM GOALS: Target date: 12/27/2021   Patient will improve FOTO score to 61% Baseline: SEE OBJECTIVE DATA Goal status: INITIAL   2.  Patient reports left knee pain </= 1/10 with standing & gait activities.  Baseline: SEE OBJECTIVE DATA Goal status: INITIAL   3.  Left Knee PROM 0* extension to 100* flexion Baseline: SEE OBJECTIVE DATA Goal status: INITIAL   4.  Left Knee AROM seated -1* extension to 95* flexion Baseline: SEE OBJECTIVE DATA Goal status: INITIAL   5.  Patient ambulates >500' community distances including negotiating ramps, curbs & stairs without device independently. Baseline: SEE OBJECTIVE DATA Goal status: INITIAL   PLAN: PT FREQUENCY: 2-3 x/wk   PT DURATION:  9 weeks   PLANNED INTERVENTIONS: Therapeutic exercises, Therapeutic activity, Neuromuscular re-education, Balance training, Gait training, Patient/Family education, Joint mobilization, Stair training, DME instructions, Electrical stimulation, Cryotherapy, Moist heat, Taping, Vasopneumatic device, and Manual therapy   PLAN FOR NEXT SESSION:  Resume WB strengthening as tolerated, extension mobility gains.    Girtha Rm, PT, DPT 11/15/2021, 1:53 PM

## 2021-11-18 ENCOUNTER — Ambulatory Visit: Payer: Medicare Other | Admitting: Rehabilitative and Restorative Service Providers"

## 2021-11-18 ENCOUNTER — Encounter: Payer: Self-pay | Admitting: Rehabilitative and Restorative Service Providers"

## 2021-11-18 DIAGNOSIS — R600 Localized edema: Secondary | ICD-10-CM

## 2021-11-18 DIAGNOSIS — M6281 Muscle weakness (generalized): Secondary | ICD-10-CM

## 2021-11-18 DIAGNOSIS — R2681 Unsteadiness on feet: Secondary | ICD-10-CM

## 2021-11-18 DIAGNOSIS — M25662 Stiffness of left knee, not elsewhere classified: Secondary | ICD-10-CM | POA: Diagnosis not present

## 2021-11-18 DIAGNOSIS — M25562 Pain in left knee: Secondary | ICD-10-CM | POA: Diagnosis not present

## 2021-11-18 DIAGNOSIS — R2689 Other abnormalities of gait and mobility: Secondary | ICD-10-CM

## 2021-11-18 NOTE — Therapy (Signed)
OUTPATIENT PHYSICAL THERAPY TREATMENT NOTE   Patient Name: Tara Davis MRN: [id number] DOB:1945-11-24, 76 y.o., female Today's Date: 11/18/2021  PCP: Glendale Chard, MD REFERRING PROVIDER: Suzan Slick, NP  END OF SESSION:   PT End of Session - 11/18/21 1400     Visit Number 6    Number of Visits 20    Date for PT Re-Evaluation 12/30/21    Authorization Type BCBS Medicare    Authorization Time Period $10 COPAY  Deductible ($0.00)Deductible has been met  Out-of-Pocket Limit ($3,950.00) $455.62 Paid  $3,494.38 to go    Progress Note Due on Visit 10    PT Start Time 1342    PT Stop Time 1434    PT Time Calculation (min) 52 min    Activity Tolerance Patient tolerated treatment well    Behavior During Therapy WFL for tasks assessed/performed                 Past Medical History:  Diagnosis Date   Adenomatous colon polyp    Anxiety    Arthritis    Bilateral cataracts    within 6 mos.   Depression    Diverticulosis    Fatty liver    GERD (gastroesophageal reflux disease)    H. pylori infection    Hyperlipidemia    Ulcer 1988   Uterine fibroid    Past Surgical History:  Procedure Laterality Date   APPENDECTOMY     CATARACT EXTRACTION, BILATERAL     CHOLECYSTECTOMY     COLONOSCOPY     spontaneous vaginal delivery     x2   TONSILLECTOMY     TOTAL KNEE ARTHROPLASTY Left 10/11/2021   Procedure: LEFT TOTAL KNEE ARTHROPLASTY;  Surgeon: Newt Minion, MD;  Location: [facility];  Service: Orthopedics;  Laterality: Left;   WISDOM TOOTH EXTRACTION     Patient Active Problem List   Diagnosis Date Noted   Total knee replacement status, left 10/11/2021   Unilateral primary osteoarthritis, left knee    Personal history of colonic polyps 08/07/2020   Gastroesophageal reflux disease 08/23/2019   Tobacco abuse 01/12/2018   Cigarette smoker 01/12/2018   IBS 07/05/2008    REFERRING DIAG: M17.12 (ICD-10-CM) - Unilateral primary osteoarthritis, left knee  THERAPY DIAG:   Stiffness of left knee, not elsewhere classified  Acute pain of left knee  Muscle weakness (generalized)  Localized edema  Unsteadiness on feet  Other abnormalities of gait and mobility  Rationale for Evaluation and Treatment Rehabilitation  PERTINENT HISTORY: OA, Left TKR 10/11/21, anxiety, depresssion, IBS diverticulosis  PRECAUTIONS: None  SUBJECTIVE:  Pt indicated no pain at rest upon arrival.  Pain indicated at night up to 7/10 with difficulty sleeping.   PAIN:  NPRS scale:  2/10 current, 7/10 at worst in last 24 hours Pain location: left knee & ankle Pain description: dull Aggravating factors: first standing on LLE, getting up Relieving factors: ice, heat   OBJECTIVE: (objective measures completed at initial evaluation unless otherwise dated)   DIAGNOSTIC FINDINGS: 10/24/2021: 2 view radiographs of the left knee shows a stable total knee arthroplasty  no complicating features.   PATIENT SURVEYS:  10/29/21: FOTO   43% and target 61%   COGNITION: 10/29/21:  Overall cognitive status: Within functional limits for tasks assessed                        POSTURE: 10/29/21:  weight shift right   EDEMA: 10/29/21:  RLE:  above knee  42.3cm around knee 40.3cm below knee  34.8cm 10/29/21:  LLE:  above knee 48cm around knee 44.3cm below knee 39.3cm   PALPATION: 10/29/21:  pt has ~6 areas on incision that appear to have internal sutures working out. Edema pocket and tenderness proximal incision. Tenderness along incision & joint line.     LOWER EXTREMITY ROM:   ROM P:passive A:active Right Eval 10/29/21 Left Eval 10/29/21 Left 11/11/21  Hip flexion       Hip extension       Hip abduction       Hip adduction       Hip internal rotation       Hip external rotation       Knee flexion   Seated P: 89* Supine  A: 65* Supine P: 94*  Knee extension   Seated A: -18* Supine P: -9*   Ankle dorsiflexion       Ankle plantarflexion       Ankle inversion       Ankle eversion         (Blank rows = not tested)   LOWER EXTREMITY MMT:   MMT Right 10/29/21 Left 10/29/21  Hip flexion      Hip extension      Hip abduction      Hip adduction      Hip internal rotation      Hip external rotation      Knee flexion   3-/5  Knee extension   3-/5  Ankle dorsiflexion      Ankle plantarflexion      Ankle inversion      Ankle eversion       (Blank rows = not tested)     GAIT: 11/15/2021:  independent ambulation in clinic today (reported no cane use at home)  10/29/21:  Distance walked: 100' Assistive device utilized: Single point cane Level of assistance: SBA verbal cues Comments: antalgic with decreased stance LLE, left knee flexed in stance & minimal knee flexion in swing.      TODAY'S TREATMENT: 11/18/2021 Therapeutic Exercise:  Recumbent  Seated Lt leg LAQ 5 lbs x 15, 0 lbs x 15 c pause in end range, contralateral movement opposite Seated Lt quad set 5 sec hold x 10  Leg press double leg 75 lbs x 15, Lt leg only 25 lbs x 15 Supine heel prop c vaso to tolerance  Manual Therapy:  Seated Lt knee flexion mobilization c movement Ir/distraction c contral  Modalities: Vaso medium compression left knee 34* 10 min  11/15/2021 Therapeutic Exercise:  Nustep Lvl 5 UE/LE 8 mins  Seated Lt leg LAQ 3 lbs 2 x 15 c pause in end range, contralateral movement opposite Seated Lt quad set 5 sec hold x 10  Sit to stand to sit 20 inch table no UE assist x 10 Supine heel prop c vaso to tolerance  Neuromuscular Re-education:   Church pew anterior/posterior ankle strategy weight shift 2 mins  Retro step x 15 Lt posterior  Manual Therapy:  Seated Lt knee flexion mobilization c movement Ir/distraction c contral   Modalities: Vaso medium compression left knee 34* 10 min  11/13/2021 Therapeutic Exercise:  Aerobic: [employer] recumbent bike LEs only seat 9 rocking back/forth knee flexion stretch for 2 min then full revolutions forward level 1 for 6 min for 8 min total. Supine:  hamstring stretch bw reps leg press SLR strap 30 sec hold 2 reps.  Prone:  Seated:   Standing: knee flexion stretch with LLE  on 2nd step of stairs forward wt shift 10 sec hold 10 reps. Active lt knee flexion tapping 2nd step 10 reps Gastroc stretch step heel depression 30 sec hold 2 reps  Neuromuscular Re-education: tandem stance on foam 30 sec LLE in front & in back 2 reps ea.  LLE SLS on foam with intermittent touch //bars 10 sec 5 reps. Manual Therapy:  PROM lt knee flexion & ext with overpressure Therapeutic Activity: Leg Press BLEs 106# 15 reps 2 sets 5 sec hold flex & ext  Stairs 11 steps 7" tall descend step to eccentric LLE with right rail & Lt HHA and ascend alternating focus on stance press LLE & swing flexion with left rail (same as her home). Verbal cues only.  Self Care: Trigger Point Dry Needling:  Modalities: Vaso medium compression left knee 34* 10 min    HOME EXERCISE PROGRAM: Access Code: [email or web address] URL: [email or web address] Date: 10/29/2021 Prepared by: Jamey Reas   Exercises - Quad Setting and Stretching  - 2-4 x daily - 7 x weekly - 5-10 sets - 10 reps - prop 5-10 minutes & quad set5 seconds hold - Supine Heel Slide with Strap  - 2-3 x daily - 7 x weekly - 2-3 sets - 10 reps - 5 seconds hold - Supine Straight Leg Raises  - 2-3 x daily - 7 x weekly - 2-3 sets - 10 reps - 5 seconds hold - Seated Knee Flexion Extension AROM   - 2-4 x daily - 7 x weekly - 2-3 sets - 10 reps - 5 seconds hold - Seated straight leg lifts  - 2-3 x daily - 7 x weekly - 2-3 sets - 10 reps - 5 seconds hold - Seated Long Arc Quad  - 2-3 x daily - 7 x weekly - 2 sets - 10 reps - 5 seconds hold - Seated Hamstring Stretch with Strap  - 2-4 x daily - 7 x weekly - 1 sets - 3 reps - 20-30 seconds hold   ASSESSMENT:   CLINICAL IMPRESSION: Continued limitations in extension mobility and quad strength c difficulty in WB activity at this time.  Continued skilled PT services indicated  to continue to address impairments.  Encouraged routine consistent use of HEP for mobility.    OBJECTIVE IMPAIRMENTS Abnormal gait, decreased activity tolerance, decreased balance, decreased knowledge of condition, decreased knowledge of use of DME, decreased mobility, difficulty walking, decreased ROM, decreased strength, increased edema, increased muscle spasms, impaired flexibility, postural dysfunction, and pain.    ACTIVITY LIMITATIONS carrying, lifting, bending, sitting, standing, squatting, sleeping, stairs, transfers, and locomotion level   PARTICIPATION LIMITATIONS: meal prep, cleaning, laundry, driving, community activity, and occupation   [place] and 1-2 comorbidities: see PMH above  are also affecting patient's functional outcome.    REHAB POTENTIAL: Good   CLINICAL DECISION MAKING: Stable/uncomplicated   EVALUATION COMPLEXITY: Low     GOALS: Goals reviewed with patient? Yes   SHORT TERM GOALS: Target date: 11/29/2021   Patient independent and verbalizes compliance with initial HEP Baseline: SEE OBJECTIVE DATA Goal status: on going - assessed 11/15/2021   2.  Patient reports 50% improvement in left knee pain. Baseline: SEE OBJECTIVE DATA Goal status: on going - assessed 11/15/2021   3.  PROM left knee extension -3* to flexion 90* Baseline: SEE OBJECTIVE DATA Goal status: on going - assessed 11/15/2021   LONG TERM GOALS: Target date: 12/27/2021   Patient will improve FOTO score to 61% Baseline: SEE OBJECTIVE DATA Goal status:  INITIAL   2.  Patient reports left knee pain </= 1/10 with standing & gait activities.  Baseline: SEE OBJECTIVE DATA Goal status: INITIAL   3.  Left Knee PROM 0* extension to 100* flexion Baseline: SEE OBJECTIVE DATA Goal status: INITIAL   4.  Left Knee AROM seated -1* extension to 95* flexion Baseline: SEE OBJECTIVE DATA Goal status: INITIAL   5.  Patient ambulates >500' community distances including negotiating ramps,  curbs & stairs without device independently. Baseline: SEE OBJECTIVE DATA Goal status: INITIAL   PLAN: PT FREQUENCY: 2-3 x/wk   PT DURATION:  9 weeks   PLANNED INTERVENTIONS: Therapeutic exercises, Therapeutic activity, Neuromuscular re-education, Balance training, Gait training, Patient/Family education, Joint mobilization, Stair training, DME instructions, Electrical stimulation, Cryotherapy, Moist heat, Taping, Vasopneumatic device, and Manual therapy   PLAN FOR NEXT SESSION:  WB strengthening as tolerated/extension c adjustment.    Scot Jun, PT, DPT, OCS, ATC 11/18/21  2:31 PM

## 2021-11-20 ENCOUNTER — Encounter: Payer: Medicare Other | Admitting: Physical Therapy

## 2021-11-20 NOTE — Therapy (Incomplete)
OUTPATIENT PHYSICAL THERAPY TREATMENT NOTE   Patient Name: Tara Davis MRN: [id number] DOB:Apr 24, 1946, 76 y.o., female Today's Date: 11/20/2021  PCP: Glendale Chard, MD REFERRING PROVIDER: Suzan Slick, NP  END OF SESSION:         Past Medical History:  Diagnosis Date   Adenomatous colon polyp    Anxiety    Arthritis    Bilateral cataracts    within 6 mos.   Depression    Diverticulosis    Fatty liver    GERD (gastroesophageal reflux disease)    H. pylori infection    Hyperlipidemia    Ulcer 1988   Uterine fibroid    Past Surgical History:  Procedure Laterality Date   APPENDECTOMY     CATARACT EXTRACTION, BILATERAL     CHOLECYSTECTOMY     COLONOSCOPY     spontaneous vaginal delivery     x2   TONSILLECTOMY     TOTAL KNEE ARTHROPLASTY Left 10/11/2021   Procedure: LEFT TOTAL KNEE ARTHROPLASTY;  Surgeon: Newt Minion, MD;  Location: [facility];  Service: Orthopedics;  Laterality: Left;   WISDOM TOOTH EXTRACTION     Patient Active Problem List   Diagnosis Date Noted   Total knee replacement status, left 10/11/2021   Unilateral primary osteoarthritis, left knee    Personal history of colonic polyps 08/07/2020   Gastroesophageal reflux disease 08/23/2019   Tobacco abuse 01/12/2018   Cigarette smoker 01/12/2018   IBS 07/05/2008    REFERRING DIAG: M17.12 (ICD-10-CM) - Unilateral primary osteoarthritis, left knee  THERAPY DIAG:  No diagnosis found.  Rationale for Evaluation and Treatment Rehabilitation  PERTINENT HISTORY: OA, Left TKR 10/11/21, anxiety, depresssion, IBS diverticulosis  PRECAUTIONS: None  SUBJECTIVE: *** Pt indicated no pain at rest upon arrival.  Pain indicated at night up to 7/10 with difficulty sleeping.   PAIN:  NPRS scale:  *** 2/10 current, 7/10 at worst in last 24 hours Pain location: left knee & ankle Pain description: dull Aggravating factors: first standing on LLE, getting up Relieving factors: ice, heat   OBJECTIVE:  (objective measures completed at initial evaluation unless otherwise dated)   DIAGNOSTIC FINDINGS: 10/24/2021: 2 view radiographs of the left knee shows a stable total knee arthroplasty  no complicating features.   PATIENT SURVEYS:  10/29/21: FOTO   43% and target 61%   COGNITION: 10/29/21:  Overall cognitive status: Within functional limits for tasks assessed                        POSTURE: 10/29/21:  weight shift right   EDEMA: 10/29/21:  RLE:  above knee 42.3cm around knee 40.3cm below knee  34.8cm 10/29/21:  LLE:  above knee 48cm around knee 44.3cm below knee 39.3cm   PALPATION: 10/29/21:  pt has ~6 areas on incision that appear to have internal sutures working out. Edema pocket and tenderness proximal incision. Tenderness along incision & joint line.     LOWER EXTREMITY ROM:   ROM P:passive A:active Right Eval 10/29/21 Left Eval 10/29/21 Left 11/11/21  Hip flexion       Hip extension       Hip abduction       Hip adduction       Hip internal rotation       Hip external rotation       Knee flexion   Seated P: 89* Supine  A: 65* Supine P: 94*  Knee extension   Seated A: -18* Supine P: -9*  Ankle dorsiflexion       Ankle plantarflexion       Ankle inversion       Ankle eversion        (Blank rows = not tested)   LOWER EXTREMITY MMT:   MMT Right 10/29/21 Left 10/29/21  Hip flexion      Hip extension      Hip abduction      Hip adduction      Hip internal rotation      Hip external rotation      Knee flexion   3-/5  Knee extension   3-/5  Ankle dorsiflexion      Ankle plantarflexion      Ankle inversion      Ankle eversion       (Blank rows = not tested)     GAIT: 11/15/2021:  independent ambulation in clinic today (reported no cane use at home)  10/29/21:  Distance walked: 100' Assistive device utilized: Single point cane Level of assistance: SBA verbal cues Comments: antalgic with decreased stance LLE, left knee flexed in stance & minimal knee flexion in  swing.      TODAY'S TREATMENT: 11/20/2021 ***  11/18/2021 Therapeutic Exercise:  Recumbent  Seated Lt leg LAQ 5 lbs x 15, 0 lbs x 15 c pause in end range, contralateral movement opposite Seated Lt quad set 5 sec hold x 10  Leg press double leg 75 lbs x 15, Lt leg only 25 lbs x 15 Supine heel prop c vaso to tolerance  Manual Therapy:  Seated Lt knee flexion mobilization c movement Ir/distraction c contral  Modalities: Vaso medium compression left knee 34* 10 min  11/15/2021 Therapeutic Exercise:  Nustep Lvl 5 UE/LE 8 mins  Seated Lt leg LAQ 3 lbs 2 x 15 c pause in end range, contralateral movement opposite Seated Lt quad set 5 sec hold x 10  Sit to stand to sit 20 inch table no UE assist x 10 Supine heel prop c vaso to tolerance  Neuromuscular Re-education:   Church pew anterior/posterior ankle strategy weight shift 2 mins  Retro step x 15 Lt posterior  Manual Therapy:  Seated Lt knee flexion mobilization c movement Ir/distraction c contral   Modalities: Vaso medium compression left knee 34* 10 min     HOME EXERCISE PROGRAM: Access Code: [email or web address] URL: [email or web address] Date: 10/29/2021 Prepared by: Jamey Reas   Exercises - Quad Setting and Stretching  - 2-4 x daily - 7 x weekly - 5-10 sets - 10 reps - prop 5-10 minutes & quad set5 seconds hold - Supine Heel Slide with Strap  - 2-3 x daily - 7 x weekly - 2-3 sets - 10 reps - 5 seconds hold - Supine Straight Leg Raises  - 2-3 x daily - 7 x weekly - 2-3 sets - 10 reps - 5 seconds hold - Seated Knee Flexion Extension AROM   - 2-4 x daily - 7 x weekly - 2-3 sets - 10 reps - 5 seconds hold - Seated straight leg lifts  - 2-3 x daily - 7 x weekly - 2-3 sets - 10 reps - 5 seconds hold - Seated Long Arc Quad  - 2-3 x daily - 7 x weekly - 2 sets - 10 reps - 5 seconds hold - Seated Hamstring Stretch with Strap  - 2-4 x daily - 7 x weekly - 1 sets - 3 reps - 20-30 seconds hold   ASSESSMENT:   CLINICAL  IMPRESSION: *** Continued  limitations in extension mobility and quad strength c difficulty in WB activity at this time.  Continued skilled PT services indicated to continue to address impairments.  Encouraged routine consistent use of HEP for mobility.    OBJECTIVE IMPAIRMENTS Abnormal gait, decreased activity tolerance, decreased balance, decreased knowledge of condition, decreased knowledge of use of DME, decreased mobility, difficulty walking, decreased ROM, decreased strength, increased edema, increased muscle spasms, impaired flexibility, postural dysfunction, and pain.    ACTIVITY LIMITATIONS carrying, lifting, bending, sitting, standing, squatting, sleeping, stairs, transfers, and locomotion level   PARTICIPATION LIMITATIONS: meal prep, cleaning, laundry, driving, community activity, and occupation   [place] and 1-2 comorbidities: see PMH above  are also affecting patient's functional outcome.    REHAB POTENTIAL: Good   CLINICAL DECISION MAKING: Stable/uncomplicated   EVALUATION COMPLEXITY: Low     GOALS: Goals reviewed with patient? Yes   SHORT TERM GOALS: Target date: 11/29/2021   Patient independent and verbalizes compliance with initial HEP Baseline: SEE OBJECTIVE DATA Goal status: on going - assessed 11/15/2021   2.  Patient reports 50% improvement in left knee pain. Baseline: SEE OBJECTIVE DATA Goal status: on going - assessed 11/15/2021   3.  PROM left knee extension -3* to flexion 90* Baseline: SEE OBJECTIVE DATA Goal status: on going - assessed 11/15/2021   LONG TERM GOALS: Target date: 12/27/2021   Patient will improve FOTO score to 61% Baseline: SEE OBJECTIVE DATA Goal status: INITIAL   2.  Patient reports left knee pain </= 1/10 with standing & gait activities.  Baseline: SEE OBJECTIVE DATA Goal status: INITIAL   3.  Left Knee PROM 0* extension to 100* flexion Baseline: SEE OBJECTIVE DATA Goal status: INITIAL   4.  Left Knee AROM seated -1*  extension to 95* flexion Baseline: SEE OBJECTIVE DATA Goal status: INITIAL   5.  Patient ambulates >500' community distances including negotiating ramps, curbs & stairs without device independently. Baseline: SEE OBJECTIVE DATA Goal status: INITIAL   PLAN: PT FREQUENCY: 2-3 x/wk   PT DURATION:  9 weeks   PLANNED INTERVENTIONS: Therapeutic exercises, Therapeutic activity, Neuromuscular re-education, Balance training, Gait training, Patient/Family education, Joint mobilization, Stair training, DME instructions, Electrical stimulation, Cryotherapy, Moist heat, Taping, Vasopneumatic device, and Manual therapy   PLAN FOR NEXT SESSION:  *** WB strengthening as tolerated/extension c adjustment.    Jamey Reas, PT, DPT 11/20/21  8:31 AM

## 2021-11-22 ENCOUNTER — Encounter: Payer: Self-pay | Admitting: Rehabilitative and Restorative Service Providers"

## 2021-11-22 ENCOUNTER — Ambulatory Visit: Payer: Medicare Other | Admitting: Rehabilitative and Restorative Service Providers"

## 2021-11-22 DIAGNOSIS — M6281 Muscle weakness (generalized): Secondary | ICD-10-CM | POA: Diagnosis not present

## 2021-11-22 DIAGNOSIS — R2681 Unsteadiness on feet: Secondary | ICD-10-CM

## 2021-11-22 DIAGNOSIS — M25662 Stiffness of left knee, not elsewhere classified: Secondary | ICD-10-CM | POA: Diagnosis not present

## 2021-11-22 DIAGNOSIS — M25562 Pain in left knee: Secondary | ICD-10-CM

## 2021-11-22 DIAGNOSIS — R600 Localized edema: Secondary | ICD-10-CM

## 2021-11-22 DIAGNOSIS — R2689 Other abnormalities of gait and mobility: Secondary | ICD-10-CM

## 2021-11-22 NOTE — Therapy (Signed)
OUTPATIENT PHYSICAL THERAPY TREATMENT NOTE /PROGRESS NOTE   Patient Name: Tara Davis MRN: [id number] DOB:Aug 27, 1945, 76 y.o., female Today's Date: 11/22/2021  PCP: Glendale Chard, MD REFERRING PROVIDER: Suzan Slick, NP  Progress Note Reporting Period 10/29/2021 to 11/22/2021  See note below for Objective Data and Assessment of Progress/Goals.      END OF SESSION:   PT End of Session - 11/22/21 1259     Visit Number 7    Number of Visits 20    Date for PT Re-Evaluation 12/30/21    Authorization Type BCBS Medicare    Authorization Time Period $10 COPAY  Deductible ($0.00)Deductible has been met  Out-of-Pocket Limit ($3,950.00) $455.62 Paid  $3,494.38 to go    Progress Note Due on Visit 17    PT Start Time 1259    PT Stop Time 1353    PT Time Calculation (min) 54 min    Activity Tolerance Patient limited by fatigue;Patient tolerated treatment well    Behavior During Therapy WFL for tasks assessed/performed                  Past Medical History:  Diagnosis Date   Adenomatous colon polyp    Anxiety    Arthritis    Bilateral cataracts    within 6 mos.   Depression    Diverticulosis    Fatty liver    GERD (gastroesophageal reflux disease)    H. pylori infection    Hyperlipidemia    Ulcer 1988   Uterine fibroid    Past Surgical History:  Procedure Laterality Date   APPENDECTOMY     CATARACT EXTRACTION, BILATERAL     CHOLECYSTECTOMY     COLONOSCOPY     spontaneous vaginal delivery     x2   TONSILLECTOMY     TOTAL KNEE ARTHROPLASTY Left 10/11/2021   Procedure: LEFT TOTAL KNEE ARTHROPLASTY;  Surgeon: Newt Minion, MD;  Location: [facility];  Service: Orthopedics;  Laterality: Left;   WISDOM TOOTH EXTRACTION     Patient Active Problem List   Diagnosis Date Noted   Total knee replacement status, left 10/11/2021   Unilateral primary osteoarthritis, left knee    Personal history of colonic polyps 08/07/2020   Gastroesophageal reflux disease 08/23/2019    Tobacco abuse 01/12/2018   Cigarette smoker 01/12/2018   IBS 07/05/2008    REFERRING DIAG: M17.12 (ICD-10-CM) - Unilateral primary osteoarthritis, left knee  THERAPY DIAG:  Stiffness of left knee, not elsewhere classified  Acute pain of left knee  Muscle weakness (generalized)  Localized edema  Unsteadiness on feet  Other abnormalities of gait and mobility  Rationale for Evaluation and Treatment Rehabilitation  PERTINENT HISTORY: OA, Left TKR 10/11/21, anxiety, depresssion, IBS diverticulosis  PRECAUTIONS: None  SUBJECTIVE:  Pt indicated she had stomach bug that kept her out of the visit this week.   Pt indicated she wasn't able to do the normal exercise routine due illness.   PAIN:  NPRS scale:  2/10 Pain location: left knee Pain description: dull Aggravating factors: stiffness after inactivity.  Relieving factors: ice, heat   OBJECTIVE: (objective measures completed at initial evaluation unless otherwise dated)   DIAGNOSTIC FINDINGS: 10/24/2021: 2 view radiographs of the left knee shows a stable total knee arthroplasty  no complicating features.   PATIENT SURVEYS:  11/22/2021:  FOTO 55%  10/29/21: FOTO   43% and target 61%   COGNITION: 10/29/21:  Overall cognitive status: Within functional limits for tasks assessed  POSTURE: 10/29/21:  weight shift right   EDEMA: 10/29/21:  RLE:  above knee 42.3cm around knee 40.3cm below knee  34.8cm 10/29/21:  LLE:  above knee 48cm around knee 44.3cm below knee 39.3cm   PALPATION: 10/29/21:  pt has ~6 areas on incision that appear to have internal sutures working out. Edema pocket and tenderness proximal incision. Tenderness along incision & joint line.     LOWER EXTREMITY ROM:   ROM P:passive A:active Right Eval 10/29/21 Left Eval 10/29/21 Left 11/11/21 Left 11/22/2021  Hip flexion        Hip extension        Hip abduction        Hip adduction        Hip internal rotation        Hip external  rotation        Knee flexion   Seated P: 89* Supine  A: 65* Supine P: 94* Supine AROM heel slide: 102   Knee extension   Seated A: -18* Supine P: -9*  Seated LAQ -15  Supine quad set AROM -5  Ankle dorsiflexion        Ankle plantarflexion        Ankle inversion        Ankle eversion         (Blank rows = not tested)   LOWER EXTREMITY MMT:   MMT Left 10/29/21 Right 11/22/2021 Left 11/22/2021  Hip flexion      Hip extension      Hip abduction      Hip adduction      Hip internal rotation      Hip external rotation      Knee flexion 3-/5    Knee extension 3-/5 5/5 36.8, 35.8 lbs 4/5 19.2, 18.5 lbs  Ankle dorsiflexion      Ankle plantarflexion      Ankle inversion      Ankle eversion       (Blank rows = not tested)     GAIT: 11/15/2021:  independent ambulation in clinic today (reported no cane use at home)  10/29/21:  Distance walked: 100' Assistive device utilized: Single point cane Level of assistance: SBA verbal cues Comments: antalgic with decreased stance LLE, left knee flexed in stance & minimal knee flexion in swing.      TODAY'S TREATMENT: 11/22/2021 Therapeutic Exercise:  Recumbent bike 8 mins partial circles   Seated SLR Lt 2 x 10  Seated Lt leg LAQ 5 lbs 2 x 15 c pause in end range, contralateral movement opposite Seated Lt quad set 5 sec hold x 10  Leg press double leg 75 lbs x 15, Lt leg only 25 lbs x 15 Supine heel prop c vaso to tolerance  Manual Therapy:  Seated Lt knee flexion mobilization c movement Ir/distraction c contralateral leg movement  Modalities: Vaso medium compression left knee 34* 10 min  11/18/2021 Therapeutic Exercise:  Recumbent bike 8 mins partial circles Seated Lt leg LAQ 5 lbs x 15, 0 lbs x 15 c pause in end range, contralateral movement opposite Seated Lt quad set 5 sec hold x 10  Leg press double leg 75 lbs x 15, Lt leg only 25 lbs x 15 Supine heel prop c vaso to tolerance  Manual Therapy:  Seated Lt knee flexion  mobilization c movement Ir/distraction c contralateral leg movement  Modalities: Vaso medium compression left knee 34* 10 min  11/15/2021 Therapeutic Exercise:  Nustep Lvl 5 UE/LE 8 mins  Seated Lt  leg LAQ 3 lbs 2 x 15 c pause in end range, contralateral movement opposite Seated Lt quad set 5 sec hold x 10  Sit to stand to sit 20 inch table no UE assist x 10 Supine heel prop c vaso to tolerance  Neuromuscular Re-education:   Church pew anterior/posterior ankle strategy weight shift 2 mins  Retro step x 15 Lt posterior  Manual Therapy:  Seated Lt knee flexion mobilization c movement Ir/distraction c contral   Modalities: Vaso medium compression left knee 34* 10 min  HOME EXERCISE PROGRAM: Access Code: [email or web address] URL: [email or web address] Date: 11/22/2021 Prepared by: Scot Jun  Exercises - Supine Heel Slide with Strap  - 2-3 x daily - 7 x weekly - 2-3 sets - 10 reps - 5 seconds hold - Supine Straight Leg Raises  - 2-3 x daily - 7 x weekly - 2-3 sets - 10 reps - 5 seconds hold - Seated Knee Flexion Extension AROM   - 2-4 x daily - 7 x weekly - 2-3 sets - 10 reps - 5 seconds hold - Seated Long Arc Quad  - 2-3 x daily - 7 x weekly - 2 sets - 10 reps - 5 seconds hold - Seated Hamstring Stretch with Strap  - 2-4 x daily - 7 x weekly - 1 sets - 3 reps - 20-30 seconds hold - Seated Straight Leg Heel Taps  - 1-2 x daily - 7 x weekly - 1-2 sets - 10 reps - Seated Quad Set (Mirrored)  - 3-5 x daily - 7 x weekly - 1 sets - 10 reps - 5 hold   ASSESSMENT:   CLINICAL IMPRESSION: Pt has attended 7 visits overall during course of treatment.  See objective data for updated information regarding current presentation. Gains noted in most areas but continued deficits noted at this time that would benefit from continued skilled PT services.    OBJECTIVE IMPAIRMENTS Abnormal gait, decreased activity tolerance, decreased balance, decreased knowledge of condition, decreased knowledge  of use of DME, decreased mobility, difficulty walking, decreased ROM, decreased strength, increased edema, increased muscle spasms, impaired flexibility, postural dysfunction, and pain.    ACTIVITY LIMITATIONS carrying, lifting, bending, sitting, standing, squatting, sleeping, stairs, transfers, and locomotion level   PARTICIPATION LIMITATIONS: meal prep, cleaning, laundry, driving, community activity, and occupation   [place] and 1-2 comorbidities: see PMH above  are also affecting patient's functional outcome.    REHAB POTENTIAL: Good   CLINICAL DECISION MAKING: Stable/uncomplicated   EVALUATION COMPLEXITY: Low     GOALS: Goals reviewed with patient? Yes   SHORT TERM GOALS: Target date: 11/29/2021   Patient independent and verbalizes compliance with initial HEP Baseline: SEE OBJECTIVE DATA Goal status: MET - assessed 11/22/2021   2.  Patient reports 50% improvement in left knee pain. Baseline: SEE OBJECTIVE DATA Goal status: partially met - assessed 11/22/2021   3.  PROM left knee extension -3* to flexion 90* Baseline: SEE OBJECTIVE DATA Goal status: partially met - assessed 11/22/2021   LONG TERM GOALS: Target date: 12/27/2021   Patient will improve FOTO score to 61% Baseline: SEE OBJECTIVE DATA Goal status: on going  - assessed 11/22/2021   2.  Patient reports left knee pain </= 1/10 with standing & gait activities.  Baseline: SEE OBJECTIVE DATA Goal status: on going  - assessed 11/22/2021   3.  Left Knee PROM 0* extension to 100* flexion Baseline: SEE OBJECTIVE DATA Goal status: on going  - assessed 11/22/2021   4.  Left Knee AROM seated -1* extension to 95* flexion Baseline: SEE OBJECTIVE DATA Goal status: on going  - assessed 11/22/2021   5.  Patient ambulates >500' community distances including negotiating ramps, curbs & stairs without device independently. Baseline: SEE OBJECTIVE DATA Goal status: on going  - assessed 11/22/2021   PLAN: PT  FREQUENCY: 2-3 x/wk   PT DURATION:  9 weeks   PLANNED INTERVENTIONS: Therapeutic exercises, Therapeutic activity, Neuromuscular re-education, Balance training, Gait training, Patient/Family education, Joint mobilization, Stair training, DME instructions, Electrical stimulation, Cryotherapy, Moist heat, Taping, Vasopneumatic device, and Manual therapy   PLAN FOR NEXT SESSION:  Progressive mobility and strength as tolerated.    Scot Jun, PT, DPT, OCS, ATC 11/22/21  2:02 PM

## 2021-11-25 ENCOUNTER — Encounter: Payer: Self-pay | Admitting: Orthopedic Surgery

## 2021-11-25 ENCOUNTER — Ambulatory Visit (INDEPENDENT_AMBULATORY_CARE_PROVIDER_SITE_OTHER): Payer: Medicare Other | Admitting: Orthopedic Surgery

## 2021-11-25 DIAGNOSIS — Z96652 Presence of left artificial knee joint: Secondary | ICD-10-CM

## 2021-11-25 NOTE — Progress Notes (Signed)
Office Visit Note   Patient: Tara Davis           Date of Birth: [birth year]           MRN: [id number] Visit Date: 11/25/2021              Requested by: Glendale Chard, [place] [address],  [place] [address] PCP: Glendale Chard, MD  Chief Complaint  Patient presents with   Left Knee - Follow-up    Left total knee arthroplasty 10/11/2021      HPI: Patient is a 76 year old woman who presents 6 weeks status post left total knee arthroplasty.  Assessment & Plan: Visit Diagnoses:  1. Status post total left knee replacement     Plan: Patient will continue with her physical therapy she has shown excellent improvement.  Follow-Up Instructions: Return in about 4 weeks (around 12/23/2021).   Ortho Exam  Patient is alert, oriented, no adenopathy, well-dressed, normal affect, normal respiratory effort. Examination patient has range of motion from 0 to 120 degrees.  The incision is well-healed.  Imaging: No results found. No images are attached to the encounter.  Labs: Lab Results  Component Value Date   HGBA1C 6.2 (H) 10/03/2021   HGBA1C 5.9 (H) 04/13/2018   LABORGA Multiple bacterial morphotypes present, none 12/17/2014   LABORGA predominant. Suggest appropriate recollection if 12/17/2014   LABORGA clinically indicated. 12/17/2014     Lab Results  Component Value Date   ALBUMIN 4.4 10/03/2021   ALBUMIN 4.5 12/07/2018   ALBUMIN 4.6 04/13/2018    Lab Results  Component Value Date   MG 2.1 01/07/2008   Lab Results  Component Value Date   VD25OH 31.2 04/13/2018    No results found for: "PREALBUMIN"    Latest Ref Rng & Units 10/03/2021    3:51 PM 12/07/2018    9:22 AM 04/13/2018   12:52 PM  CBC EXTENDED  WBC 3.4 - 10.8 x10E3/uL 9.8  9.7  8.7   RBC 3.77 - 5.28 x10E6/uL 3.93  4.34  4.38   Hemoglobin 11.1 - 15.9 g/dL 13.7  14.5  14.6   HCT 34.0 - 46.6 % 38.2  41.9  42.2   Platelets 150 - 450 x10E3/uL 288  299  289   NEUT# 1.4 - 7.0 x10E3/uL   6.8    Lymph# 0.7 - 3.1 x10E3/uL  2.1       There is no height or weight on file to calculate BMI.  Orders:  No orders of the defined types were placed in this encounter.  No orders of the defined types were placed in this encounter.    Procedures: No procedures performed  Clinical Data: No additional findings.  ROS:  All other systems negative, except as noted in the HPI. Review of Systems  Objective: Vital Signs: There were no vitals taken for this visit.  Specialty Comments:  No specialty comments available.  PMFS History: Patient Active Problem List   Diagnosis Date Noted   Total knee replacement status, left 10/11/2021   Unilateral primary osteoarthritis, left knee    Personal history of colonic polyps 08/07/2020   Gastroesophageal reflux disease 08/23/2019   Tobacco abuse 01/12/2018   Cigarette smoker 01/12/2018   IBS 07/05/2008   Past Medical History:  Diagnosis Date   Adenomatous colon polyp    Anxiety    Arthritis    Bilateral cataracts    within 6 mos.   Depression    Diverticulosis    Fatty  liver    GERD (gastroesophageal reflux disease)    H. pylori infection    Hyperlipidemia    Ulcer 1988   Uterine fibroid     Family History  Problem Relation Age of Onset   Alzheimer's disease Mother    Depression Father    Diabetes Father    Breast cancer Sister    Colon cancer Neg Hx    Esophageal cancer Neg Hx    Stomach cancer Neg Hx     Past Surgical History:  Procedure Laterality Date   APPENDECTOMY     CATARACT EXTRACTION, BILATERAL     CHOLECYSTECTOMY     COLONOSCOPY     spontaneous vaginal delivery     x2   TONSILLECTOMY     TOTAL KNEE ARTHROPLASTY Left 10/11/2021   Procedure: LEFT TOTAL KNEE ARTHROPLASTY;  Surgeon: Newt Minion, MD;  Location: [facility];  Service: Orthopedics;  Laterality: Left;   WISDOM TOOTH EXTRACTION     Social History   Occupational History   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

## 2021-11-26 ENCOUNTER — Encounter: Payer: Self-pay | Admitting: Rehabilitative and Restorative Service Providers"

## 2021-11-26 ENCOUNTER — Ambulatory Visit: Payer: Medicare Other | Admitting: Rehabilitative and Restorative Service Providers"

## 2021-11-26 DIAGNOSIS — M25562 Pain in left knee: Secondary | ICD-10-CM

## 2021-11-26 DIAGNOSIS — R600 Localized edema: Secondary | ICD-10-CM

## 2021-11-26 DIAGNOSIS — M25662 Stiffness of left knee, not elsewhere classified: Secondary | ICD-10-CM | POA: Diagnosis not present

## 2021-11-26 DIAGNOSIS — R2689 Other abnormalities of gait and mobility: Secondary | ICD-10-CM

## 2021-11-26 DIAGNOSIS — M6281 Muscle weakness (generalized): Secondary | ICD-10-CM | POA: Diagnosis not present

## 2021-11-26 DIAGNOSIS — R2681 Unsteadiness on feet: Secondary | ICD-10-CM

## 2021-11-26 NOTE — Therapy (Signed)
OUTPATIENT PHYSICAL THERAPY TREATMENT NOTE /PROGRESS NOTE   Patient Name: Tara Davis MRN: [id number] DOB:04-16-1946, 76 y.o., female Today's Date: 11/26/2021  PCP: Glendale Chard, MD REFERRING PROVIDER: Suzan Slick, NP  Progress Note Reporting Period 10/29/2021 to 11/22/2021  See note below for Objective Data and Assessment of Progress/Goals.      END OF SESSION:   PT End of Session - 11/26/21 1353     Visit Number 8    Number of Visits 20    Date for PT Re-Evaluation 12/30/21    Authorization Type BCBS Medicare    Authorization Time Period $10 COPAY  Deductible ($0.00)Deductible has been met  Out-of-Pocket Limit ($3,950.00) $455.62 Paid  $3,494.38 to go    Progress Note Due on Visit 17    PT Start Time 1343    PT Stop Time 1434    PT Time Calculation (min) 51 min    Activity Tolerance Patient tolerated treatment well    Behavior During Therapy WFL for tasks assessed/performed                   Past Medical History:  Diagnosis Date   Adenomatous colon polyp    Anxiety    Arthritis    Bilateral cataracts    within 6 mos.   Depression    Diverticulosis    Fatty liver    GERD (gastroesophageal reflux disease)    H. pylori infection    Hyperlipidemia    Ulcer 1988   Uterine fibroid    Past Surgical History:  Procedure Laterality Date   APPENDECTOMY     CATARACT EXTRACTION, BILATERAL     CHOLECYSTECTOMY     COLONOSCOPY     spontaneous vaginal delivery     x2   TONSILLECTOMY     TOTAL KNEE ARTHROPLASTY Left 10/11/2021   Procedure: LEFT TOTAL KNEE ARTHROPLASTY;  Surgeon: Newt Minion, MD;  Location: [facility];  Service: Orthopedics;  Laterality: Left;   WISDOM TOOTH EXTRACTION     Patient Active Problem List   Diagnosis Date Noted   Total knee replacement status, left 10/11/2021   Unilateral primary osteoarthritis, left knee    Personal history of colonic polyps 08/07/2020   Gastroesophageal reflux disease 08/23/2019   Tobacco abuse 01/12/2018    Cigarette smoker 01/12/2018   IBS 07/05/2008    REFERRING DIAG: M17.12 (ICD-10-CM) - Unilateral primary osteoarthritis, left knee  THERAPY DIAG:  Stiffness of left knee, not elsewhere classified  Acute pain of left knee  Muscle weakness (generalized)  Localized edema  Unsteadiness on feet  Other abnormalities of gait and mobility  Rationale for Evaluation and Treatment Rehabilitation  PERTINENT HISTORY: OA, Left TKR 10/11/21, anxiety, depresssion, IBS diverticulosis  PRECAUTIONS: None  SUBJECTIVE:  Pt indicated sore and achy "weather".  Saw MD and indicated good overall reporting.  Mentioned getting out of chair without hands for that appointment.   PAIN:  NPRS scale:  mild (no number given) Pain location: left knee Pain description: dull Aggravating factors: stiffness after inactivity, nighttime pains Relieving factors: ice, heat   OBJECTIVE: (objective measures completed at initial evaluation unless otherwise dated)   DIAGNOSTIC FINDINGS: 10/24/2021: 2 view radiographs of the left knee shows a stable total knee arthroplasty  no complicating features.   PATIENT SURVEYS:  11/22/2021:  FOTO 55%  10/29/21: FOTO   43% and target 61%   COGNITION: 10/29/21:  Overall cognitive status: Within functional limits for tasks assessed  POSTURE: 10/29/21:  weight shift right   EDEMA: 10/29/21:  RLE:  above knee 42.3cm around knee 40.3cm below knee  34.8cm 10/29/21:  LLE:  above knee 48cm around knee 44.3cm below knee 39.3cm   PALPATION: 10/29/21:  pt has ~6 areas on incision that appear to have internal sutures working out. Edema pocket and tenderness proximal incision. Tenderness along incision & joint line.     LOWER EXTREMITY ROM:   ROM P:passive A:active Right Eval 10/29/21 Left Eval 10/29/21 Left 11/11/21 Left 11/22/2021  Hip flexion        Hip extension        Hip abduction        Hip adduction        Hip internal rotation        Hip external  rotation        Knee flexion   Seated P: 89* Supine  A: 65* Supine P: 94* Supine AROM heel slide: 102   Knee extension   Seated A: -18* Supine P: -9*  Seated LAQ -15  Supine quad set AROM -5  Ankle dorsiflexion        Ankle plantarflexion        Ankle inversion        Ankle eversion         (Blank rows = not tested)   LOWER EXTREMITY MMT:   MMT Left 10/29/21 Right 11/22/2021 Left 11/22/2021  Hip flexion      Hip extension      Hip abduction      Hip adduction      Hip internal rotation      Hip external rotation      Knee flexion 3-/5    Knee extension 3-/5 5/5 36.8, 35.8 lbs 4/5 19.2, 18.5 lbs  Ankle dorsiflexion      Ankle plantarflexion      Ankle inversion      Ankle eversion       (Blank rows = not tested)     GAIT: 11/15/2021:  independent ambulation in clinic today (reported no cane use at home)  10/29/21:  Distance walked: 100' Assistive device utilized: Single point cane Level of assistance: SBA verbal cues Comments: antalgic with decreased stance LLE, left knee flexed in stance & minimal knee flexion in swing.      TODAY'S TREATMENT: 11/26/2021 Therapeutic Exercise:  Recumbent bike 9 mins partial circles , seat 4  Seated Lt knee flexion c overpressure from Rt knee 15 sec x 6  Supine SLR x 10   TherActivity (performed to improve functional movement including transfers, stairs) Leg press double leg 75 lbs x 15, Lt leg only 25 lbs 2 x 10 4 inch step c one hand rail assist up over and down WB on Lt x 16  Neuro re-ed  Tandem stance 1 min x 1 bilateral on airex foam  Lateral stepping 3 cones x 6 bilateral   Modalities: Vaso medium compression left knee 34* 10 min   11/22/2021 Therapeutic Exercise:  Recumbent bike 8 mins partial circles   Seated SLR Lt 2 x 10  Seated Lt leg LAQ 5 lbs 2 x 15 c pause in end range, contralateral movement opposite Seated Lt quad set 5 sec hold x 10  Leg press double leg 75 lbs x 15, Lt leg only 25 lbs x 15 Supine  heel prop c vaso to tolerance  Manual Therapy:  Seated Lt knee flexion mobilization c movement Ir/distraction c contralateral leg movement  Modalities:  Vaso medium compression left knee 34* 10 min  11/18/2021 Therapeutic Exercise:  Recumbent bike 8 mins partial circles Seated Lt leg LAQ 5 lbs x 15, 0 lbs x 15 c pause in end range, contralateral movement opposite Seated Lt quad set 5 sec hold x 10  Leg press double leg 75 lbs x 15, Lt leg only 25 lbs x 15 Supine heel prop c vaso to tolerance  Manual Therapy:  Seated Lt knee flexion mobilization c movement Ir/distraction c contralateral leg movement  Modalities: Vaso medium compression left knee 34* 10 min   HOME EXERCISE PROGRAM: Access Code: [email or web address] URL: [email or web address] Date: 11/22/2021 Prepared by: Scot Jun  Exercises - Supine Heel Slide with Strap  - 2-3 x daily - 7 x weekly - 2-3 sets - 10 reps - 5 seconds hold - Supine Straight Leg Raises  - 2-3 x daily - 7 x weekly - 2-3 sets - 10 reps - 5 seconds hold - Seated Knee Flexion Extension AROM   - 2-4 x daily - 7 x weekly - 2-3 sets - 10 reps - 5 seconds hold - Seated Long Arc Quad  - 2-3 x daily - 7 x weekly - 2 sets - 10 reps - 5 seconds hold - Seated Hamstring Stretch with Strap  - 2-4 x daily - 7 x weekly - 1 sets - 3 reps - 20-30 seconds hold - Seated Straight Leg Heel Taps  - 1-2 x daily - 7 x weekly - 1-2 sets - 10 reps - Seated Quad Set (Mirrored)  - 3-5 x daily - 7 x weekly - 1 sets - 10 reps - 5 hold   ASSESSMENT:   CLINICAL IMPRESSION: Progression to include stair activity (reduced height) to promote improvement in WB strength and control.  Continued mix of skilled PT and HEP.    Continue focus on improved strength balance and general Lt knee mobility.    OBJECTIVE IMPAIRMENTS Abnormal gait, decreased activity tolerance, decreased balance, decreased knowledge of condition, decreased knowledge of use of DME, decreased mobility, difficulty  walking, decreased ROM, decreased strength, increased edema, increased muscle spasms, impaired flexibility, postural dysfunction, and pain.    ACTIVITY LIMITATIONS carrying, lifting, bending, sitting, standing, squatting, sleeping, stairs, transfers, and locomotion level   PARTICIPATION LIMITATIONS: meal prep, cleaning, laundry, driving, community activity, and occupation   [place] and 1-2 comorbidities: see PMH above  are also affecting patient's functional outcome.    REHAB POTENTIAL: Good   CLINICAL DECISION MAKING: Stable/uncomplicated   EVALUATION COMPLEXITY: Low     GOALS: Goals reviewed with patient? Yes   SHORT TERM GOALS: Target date: 11/29/2021   Patient independent and verbalizes compliance with initial HEP Baseline: SEE OBJECTIVE DATA Goal status: MET - assessed 11/22/2021   2.  Patient reports 50% improvement in left knee pain. Baseline: SEE OBJECTIVE DATA Goal status: partially met - assessed 11/22/2021   3.  PROM left knee extension -3* to flexion 90* Baseline: SEE OBJECTIVE DATA Goal status: partially met - assessed 11/22/2021   LONG TERM GOALS: Target date: 12/27/2021   Patient will improve FOTO score to 61% Baseline: SEE OBJECTIVE DATA Goal status: on going  - assessed 11/22/2021   2.  Patient reports left knee pain </= 1/10 with standing & gait activities.  Baseline: SEE OBJECTIVE DATA Goal status: on going  - assessed 11/22/2021   3.  Left Knee PROM 0* extension to 100* flexion Baseline: SEE OBJECTIVE DATA Goal status: on going  - assessed 11/22/2021  4.  Left Knee AROM seated -1* extension to 95* flexion Baseline: SEE OBJECTIVE DATA Goal status: on going  - assessed 11/22/2021   5.  Patient ambulates >500' community distances including negotiating ramps, curbs & stairs without device independently. Baseline: SEE OBJECTIVE DATA Goal status: on going  - assessed 11/22/2021   PLAN: PT FREQUENCY: 2-3 x/wk   PT DURATION:  9 weeks    PLANNED INTERVENTIONS: Therapeutic exercises, Therapeutic activity, Neuromuscular re-education, Balance training, Gait training, Patient/Family education, Joint mobilization, Stair training, DME instructions, Electrical stimulation, Cryotherapy, Moist heat, Taping, Vasopneumatic device, and Manual therapy   PLAN FOR NEXT SESSION:  Strengthening in WB (stairs progression), static and dynamic balance as appropriate.    Scot Jun, PT, DPT, OCS, ATC 11/26/21  2:26 PM

## 2021-11-28 ENCOUNTER — Encounter: Payer: Self-pay | Admitting: Rehabilitative and Restorative Service Providers"

## 2021-11-28 ENCOUNTER — Ambulatory Visit: Payer: Medicare Other | Admitting: Rehabilitative and Restorative Service Providers"

## 2021-11-28 DIAGNOSIS — M6281 Muscle weakness (generalized): Secondary | ICD-10-CM | POA: Diagnosis not present

## 2021-11-28 DIAGNOSIS — R2681 Unsteadiness on feet: Secondary | ICD-10-CM

## 2021-11-28 DIAGNOSIS — M25662 Stiffness of left knee, not elsewhere classified: Secondary | ICD-10-CM

## 2021-11-28 DIAGNOSIS — M25562 Pain in left knee: Secondary | ICD-10-CM

## 2021-11-28 DIAGNOSIS — R600 Localized edema: Secondary | ICD-10-CM

## 2021-11-28 DIAGNOSIS — R2689 Other abnormalities of gait and mobility: Secondary | ICD-10-CM

## 2021-11-28 NOTE — Therapy (Signed)
OUTPATIENT PHYSICAL THERAPY TREATMENT NOTE   Patient Name: Tara Davis MRN: [id number] DOB:[birth year], 76 y.o., female Today's Date: 11/28/2021  PCP: Glendale Chard, MD REFERRING PROVIDER: Suzan Slick, NP   END OF SESSION:   PT End of Session - 11/28/21 1348     Visit Number 9    Number of Visits 20    Date for PT Re-Evaluation 12/30/21    Authorization Type BCBS Medicare    Authorization Time Period $10 COPAY  Deductible ($0.00)Deductible has been met  Out-of-Pocket Limit ($3,950.00) $455.62 Paid  $3,494.38 to go    Progress Note Due on Visit 17    PT Start Time 1342    PT Stop Time 1436    PT Time Calculation (min) 54 min    Activity Tolerance Patient tolerated treatment well    Behavior During Therapy WFL for tasks assessed/performed                    Past Medical History:  Diagnosis Date   Adenomatous colon polyp    Anxiety    Arthritis    Bilateral cataracts    within 6 mos.   Depression    Diverticulosis    Fatty liver    GERD (gastroesophageal reflux disease)    H. pylori infection    Hyperlipidemia    Ulcer 1988   Uterine fibroid    Past Surgical History:  Procedure Laterality Date   APPENDECTOMY     CATARACT EXTRACTION, BILATERAL     CHOLECYSTECTOMY     COLONOSCOPY     spontaneous vaginal delivery     x2   TONSILLECTOMY     TOTAL KNEE ARTHROPLASTY Left 10/11/2021   Procedure: LEFT TOTAL KNEE ARTHROPLASTY;  Surgeon: Newt Minion, MD;  Location: [facility];  Service: Orthopedics;  Laterality: Left;   WISDOM TOOTH EXTRACTION     Patient Active Problem List   Diagnosis Date Noted   Total knee replacement status, left 10/11/2021   Unilateral primary osteoarthritis, left knee    Personal history of colonic polyps 08/07/2020   Gastroesophageal reflux disease 08/23/2019   Tobacco abuse 01/12/2018   Cigarette smoker 01/12/2018   IBS 07/05/2008    REFERRING DIAG: M17.12 (ICD-10-CM) - Unilateral primary osteoarthritis, left knee  THERAPY  DIAG:  Stiffness of left knee, not elsewhere classified  Acute pain of left knee  Muscle weakness (generalized)  Localized edema  Unsteadiness on feet  Other abnormalities of gait and mobility  Rationale for Evaluation and Treatment Rehabilitation  PERTINENT HISTORY: OA, Left TKR 10/11/21, anxiety, depresssion, IBS diverticulosis  PRECAUTIONS: None  SUBJECTIVE:  Pt indicated last night sleeping about 3 hours or so.  Pt indicated stiffness primarily with no pain upon arrival.   PAIN:  NPRS scale:  0/10 Pain location: left knee Pain description: stiffness Aggravating factors: stiffness after inactivity, nighttime pains Relieving factors: ice, heat   OBJECTIVE: (objective measures completed at initial evaluation unless otherwise dated)   DIAGNOSTIC FINDINGS: 10/24/2021: 2 view radiographs of the left knee shows a stable total knee arthroplasty  no complicating features.   PATIENT SURVEYS:  11/22/2021:  FOTO 55%  10/29/21: FOTO   43% and target 61%   COGNITION: 10/29/21:  Overall cognitive status: Within functional limits for tasks assessed                        POSTURE: 10/29/21:  weight shift right   EDEMA: 10/29/21:  RLE:  above knee 42.3cm  around knee 40.3cm below knee  34.8cm 10/29/21:  LLE:  above knee 48cm around knee 44.3cm below knee 39.3cm   PALPATION: 10/29/21:  pt has ~6 areas on incision that appear to have internal sutures working out. Edema pocket and tenderness proximal incision. Tenderness along incision & joint line.     LOWER EXTREMITY ROM:   ROM P:passive A:active Right Eval 10/29/21 Left Eval 10/29/21 Left 11/11/21 Left 11/22/2021 Left 11/28/2021  Hip flexion         Hip extension         Hip abduction         Hip adduction         Hip internal rotation         Hip external rotation         Knee flexion   Seated P: 89* Supine  A: 65* Supine P: 94* Supine AROM heel slide: 102  Supine heel slide AROM 109   Knee extension   Seated A:  -18* Supine P: -9*  Seated LAQ -15  Supine quad set AROM -5 Seated LAQ : -12  Supine quad set: -4  Ankle dorsiflexion         Ankle plantarflexion         Ankle inversion         Ankle eversion          (Blank rows = not tested)   LOWER EXTREMITY MMT:   MMT Left 10/29/21 Right 11/22/2021 Left 11/22/2021  Hip flexion      Hip extension      Hip abduction      Hip adduction      Hip internal rotation      Hip external rotation      Knee flexion 3-/5    Knee extension 3-/5 5/5 36.8, 35.8 lbs 4/5 19.2, 18.5 lbs  Ankle dorsiflexion      Ankle plantarflexion      Ankle inversion      Ankle eversion       (Blank rows = not tested)     GAIT: 11/15/2021:  independent ambulation in clinic today (reported no cane use at home)  10/29/21:  Distance walked: 100' Assistive device utilized: Single point cane Level of assistance: SBA verbal cues Comments: antalgic with decreased stance LLE, left knee flexed in stance & minimal knee flexion in swing.      TODAY'S TREATMENT: 11/28/2021 Therapeutic Exercise:  Recumbent bike 8 mins partial circles , seat 4  Machine LAQ Double leg up, Lt leg eccentric lowering (range to tolerance for extension) 2 x 10 5 lbs  Runner stretch on incline board 30 sec x 3 Lt leg posterior, heel on ground  Seated Lt knee flexion c overpressure from Rt knee 15 sec x 6  Supine SLR x 10   TherActivity (performed to improve functional movement including transfers, stairs) Leg press Lt leg only 31 lbs 2 x 10 6 inch step fwd Rt leg 2 x 10   Manual  Seated and supine Lt knee flexion distraction ir mobilization c movement.    Modalities: Vaso medium compression left knee 34* 10 min  11/26/2021 Therapeutic Exercise:  Recumbent bike 9 mins partial circles , seat 4  Seated Lt knee flexion c overpressure from Rt knee 15 sec x 6  Supine SLR x 10   TherActivity (performed to improve functional movement including transfers, stairs) Leg press double leg 75 lbs x  15, Lt leg only 25 lbs 2 x 10 4  inch step c one hand rail assist up over and down WB on Lt x 16  Neuro re-ed  Tandem stance 1 min x 1 bilateral on airex foam  Lateral stepping 3 cones x 6 bilateral   Modalities: Vaso medium compression left knee 34* 10 min   11/22/2021 Therapeutic Exercise:  Recumbent bike 8 mins partial circles   Seated SLR Lt 2 x 10  Seated Lt leg LAQ 5 lbs 2 x 15 c pause in end range, contralateral movement opposite Seated Lt quad set 5 sec hold x 10  Leg press double leg 75 lbs x 15, Lt leg only 25 lbs x 15 Supine heel prop c vaso to tolerance  Manual Therapy:  Seated Lt knee flexion mobilization c movement Ir/distraction c contralateral leg movement  Modalities: Vaso medium compression left knee 34* 10 min   HOME EXERCISE PROGRAM: Access Code: [email or web address] URL: [email or web address] Date: 11/22/2021 Prepared by: Scot Jun  Exercises - Supine Heel Slide with Strap  - 2-3 x daily - 7 x weekly - 2-3 sets - 10 reps - 5 seconds hold - Supine Straight Leg Raises  - 2-3 x daily - 7 x weekly - 2-3 sets - 10 reps - 5 seconds hold - Seated Knee Flexion Extension AROM   - 2-4 x daily - 7 x weekly - 2-3 sets - 10 reps - 5 seconds hold - Seated Long Arc Quad  - 2-3 x daily - 7 x weekly - 2 sets - 10 reps - 5 seconds hold - Seated Hamstring Stretch with Strap  - 2-4 x daily - 7 x weekly - 1 sets - 3 reps - 20-30 seconds hold - Seated Straight Leg Heel Taps  - 1-2 x daily - 7 x weekly - 1-2 sets - 10 reps - Seated Quad Set (Mirrored)  - 3-5 x daily - 7 x weekly - 1 sets - 10 reps - 5 hold   ASSESSMENT:   CLINICAL IMPRESSION: Progressive improvements noted in mobility (slow but steady each time measured).  Pt to continue to benefit from progressive quad strengthening to facilitate transfers, ambulation, stair navigation improvements.  Continued skilled PT indicated at this time.    OBJECTIVE IMPAIRMENTS Abnormal gait, decreased activity tolerance,  decreased balance, decreased knowledge of condition, decreased knowledge of use of DME, decreased mobility, difficulty walking, decreased ROM, decreased strength, increased edema, increased muscle spasms, impaired flexibility, postural dysfunction, and pain.    ACTIVITY LIMITATIONS carrying, lifting, bending, sitting, standing, squatting, sleeping, stairs, transfers, and locomotion level   PARTICIPATION LIMITATIONS: meal prep, cleaning, laundry, driving, community activity, and occupation   [place] and 1-2 comorbidities: see PMH above  are also affecting patient's functional outcome.    REHAB POTENTIAL: Good   CLINICAL DECISION MAKING: Stable/uncomplicated   EVALUATION COMPLEXITY: Low     GOALS: Goals reviewed with patient? Yes   SHORT TERM GOALS: Target date: 11/29/2021   Patient independent and verbalizes compliance with initial HEP Baseline: SEE OBJECTIVE DATA Goal status: MET - assessed 11/22/2021   2.  Patient reports 50% improvement in left knee pain. Baseline: SEE OBJECTIVE DATA Goal status: partially met - assessed 11/22/2021   3.  PROM left knee extension -3* to flexion 90* Baseline: SEE OBJECTIVE DATA Goal status: partially met - assessed 11/22/2021   LONG TERM GOALS: Target date: 12/27/2021   Patient will improve FOTO score to 61% Baseline: SEE OBJECTIVE DATA Goal status: on going  - assessed 11/22/2021   2.  Patient reports  left knee pain </= 1/10 with standing & gait activities.  Baseline: SEE OBJECTIVE DATA Goal status: on going  - assessed 11/22/2021   3.  Left Knee PROM 0* extension to 100* flexion Baseline: SEE OBJECTIVE DATA Goal status: on going  - assessed 11/22/2021   4.  Left Knee AROM seated -1* extension to 95* flexion Baseline: SEE OBJECTIVE DATA Goal status: on going  - assessed 11/22/2021   5.  Patient ambulates >500' community distances including negotiating ramps, curbs & stairs without device independently. Baseline: SEE  OBJECTIVE DATA Goal status: on going  - assessed 11/22/2021   PLAN: PT FREQUENCY: 2-3 x/wk   PT DURATION:  9 weeks   PLANNED INTERVENTIONS: Therapeutic exercises, Therapeutic activity, Neuromuscular re-education, Balance training, Gait training, Patient/Family education, Joint mobilization, Stair training, DME instructions, Electrical stimulation, Cryotherapy, Moist heat, Taping, Vasopneumatic device, and Manual therapy   PLAN FOR NEXT SESSION:  Strength measurements.  Progressive strengthening, dynamic balance control.    Scot Jun, PT, DPT, OCS, ATC 11/28/21  2:31 PM

## 2021-12-03 ENCOUNTER — Encounter: Payer: Self-pay | Admitting: Rehabilitative and Restorative Service Providers"

## 2021-12-03 ENCOUNTER — Ambulatory Visit: Payer: Medicare Other | Admitting: Rehabilitative and Restorative Service Providers"

## 2021-12-03 DIAGNOSIS — M6281 Muscle weakness (generalized): Secondary | ICD-10-CM | POA: Diagnosis not present

## 2021-12-03 DIAGNOSIS — R2689 Other abnormalities of gait and mobility: Secondary | ICD-10-CM

## 2021-12-03 DIAGNOSIS — M25562 Pain in left knee: Secondary | ICD-10-CM

## 2021-12-03 DIAGNOSIS — R600 Localized edema: Secondary | ICD-10-CM

## 2021-12-03 DIAGNOSIS — M25662 Stiffness of left knee, not elsewhere classified: Secondary | ICD-10-CM

## 2021-12-03 DIAGNOSIS — R2681 Unsteadiness on feet: Secondary | ICD-10-CM

## 2021-12-04 ENCOUNTER — Other Ambulatory Visit: Payer: Self-pay | Admitting: Internal Medicine

## 2021-12-05 ENCOUNTER — Ambulatory Visit: Payer: Medicare Other | Admitting: Rehabilitative and Restorative Service Providers"

## 2021-12-05 ENCOUNTER — Telehealth: Payer: Self-pay | Admitting: Orthopedic Surgery

## 2021-12-05 ENCOUNTER — Encounter: Payer: Self-pay | Admitting: Rehabilitative and Restorative Service Providers"

## 2021-12-05 DIAGNOSIS — R600 Localized edema: Secondary | ICD-10-CM

## 2021-12-05 DIAGNOSIS — M6281 Muscle weakness (generalized): Secondary | ICD-10-CM

## 2021-12-05 DIAGNOSIS — M25562 Pain in left knee: Secondary | ICD-10-CM | POA: Diagnosis not present

## 2021-12-05 DIAGNOSIS — R2689 Other abnormalities of gait and mobility: Secondary | ICD-10-CM

## 2021-12-05 DIAGNOSIS — M25662 Stiffness of left knee, not elsewhere classified: Secondary | ICD-10-CM | POA: Diagnosis not present

## 2021-12-05 DIAGNOSIS — R2681 Unsteadiness on feet: Secondary | ICD-10-CM

## 2021-12-05 NOTE — Telephone Encounter (Signed)
Patient called asked if Dr. Sharol Given would refer to a Primary Care Physician? The number to contact patient is [phone]

## 2021-12-05 NOTE — Therapy (Signed)
OUTPATIENT PHYSICAL THERAPY TREATMENT NOTE   Patient Name: Tara Davis MRN: [id number] DOB:1945/09/15, 76 y.o., female Today's Date: 12/05/2021  PCP: Glendale Chard, MD REFERRING PROVIDER: Suzan Slick, NP   END OF SESSION:   PT End of Session - 12/05/21 1257     Visit Number 11    Number of Visits 20    Date for PT Re-Evaluation 12/30/21    Authorization Type BCBS Medicare    Authorization Time Period $10 COPAY  Deductible ($0.00)Deductible has been met  Out-of-Pocket Limit ($3,950.00) $455.62 Paid  $3,494.38 to go    Progress Note Due on Visit 17    PT Start Time 1258    PT Stop Time 1347    PT Time Calculation (min) 49 min    Activity Tolerance Patient tolerated treatment well    Behavior During Therapy WFL for tasks assessed/performed              Past Medical History:  Diagnosis Date   Adenomatous colon polyp    Anxiety    Arthritis    Bilateral cataracts    within 6 mos.   Depression    Diverticulosis    Fatty liver    GERD (gastroesophageal reflux disease)    H. pylori infection    Hyperlipidemia    Ulcer 1988   Uterine fibroid    Past Surgical History:  Procedure Laterality Date   APPENDECTOMY     CATARACT EXTRACTION, BILATERAL     CHOLECYSTECTOMY     COLONOSCOPY     spontaneous vaginal delivery     x2   TONSILLECTOMY     TOTAL KNEE ARTHROPLASTY Left 10/11/2021   Procedure: LEFT TOTAL KNEE ARTHROPLASTY;  Surgeon: Newt Minion, MD;  Location: [place];  Service: Orthopedics;  Laterality: Left;   WISDOM TOOTH EXTRACTION     Patient Active Problem List   Diagnosis Date Noted   Total knee replacement status, left 10/11/2021   Unilateral primary osteoarthritis, left knee    Personal history of colonic polyps 08/07/2020   Gastroesophageal reflux disease 08/23/2019   Tobacco abuse 01/12/2018   Cigarette smoker 01/12/2018   IBS 07/05/2008    REFERRING DIAG: M17.12 (ICD-10-CM) - Unilateral primary osteoarthritis, left knee  THERAPY DIAG:   Stiffness of left knee, not elsewhere classified  Acute pain of left knee  Muscle weakness (generalized)  Localized edema  Unsteadiness on feet  Other abnormalities of gait and mobility  Rationale for Evaluation and Treatment Rehabilitation  PERTINENT HISTORY: OA, Left TKR 10/11/21, anxiety, depresssion, IBS diverticulosis  PRECAUTIONS: None  SUBJECTIVE:  Pt indicated 4-5/10 at worst in last 24 hours.  Pt indicated no new changes. Soreness reported in muscles during the day after last visit.  Still having trouble sleeping due to spasms and ache in knee.  PAIN:  NPRS scale:  4-5/10 at worst Pain location: left knee Pain description: stiffness, ache Aggravating factors: stiffness after inactivity, nighttime pains, after stairs Relieving factors: ice, heat   OBJECTIVE: (objective measures completed at initial evaluation unless otherwise dated)   DIAGNOSTIC FINDINGS: 10/24/2021: 2 view radiographs of the left knee shows a stable total knee arthroplasty  no complicating features.   PATIENT SURVEYS:  11/22/2021:  FOTO 55%  10/29/21: FOTO   43% and target 61%   COGNITION: 10/29/21:  Overall cognitive status: Within functional limits for tasks assessed                        POSTURE:  10/29/21:  weight shift right   EDEMA: 10/29/21:  RLE:  above knee 42.3cm around knee 40.3cm below knee  34.8cm 10/29/21:  LLE:  above knee 48cm around knee 44.3cm below knee 39.3cm   PALPATION: 10/29/21:  pt has ~6 areas on incision that appear to have internal sutures working out. Edema pocket and tenderness proximal incision. Tenderness along incision & joint line.     LOWER EXTREMITY ROM:   ROM P:passive A:active Right Eval 10/29/21 Left Eval 10/29/21 Left 11/11/21 Left 11/22/2021 Left 11/28/2021  Hip flexion         Hip extension         Hip abduction         Hip adduction         Hip internal rotation         Hip external rotation         Knee flexion   Seated P: 89* Supine  A: 65*  Supine P: 94* Supine AROM heel slide: 102  Supine heel slide AROM 109   Knee extension   Seated A: -18* Supine P: -9*  Seated LAQ -15  Supine quad set AROM -5 Seated LAQ : -12  Supine quad set: -4  Ankle dorsiflexion         Ankle plantarflexion         Ankle inversion         Ankle eversion          (Blank rows = not tested)   LOWER EXTREMITY MMT:   MMT Left 10/29/21 Right 11/22/2021 Left 11/22/2021 Left 12/03/2021  Hip flexion       Hip extension       Hip abduction       Hip adduction       Hip internal rotation       Hip external rotation       Knee flexion 3-/5     Knee extension 3-/5 5/5 36.8, 35.8 lbs 4/5 19.2, 18.5 lbs 4/5 20, 19 lbs  Ankle dorsiflexion       Ankle plantarflexion       Ankle inversion       Ankle eversion        (Blank rows = not tested)     GAIT: 11/15/2021:  independent ambulation in clinic today (reported no cane use at home)  10/29/21:  Distance walked: 100' Assistive device utilized: Single point cane Level of assistance: SBA verbal cues Comments: antalgic with decreased stance LLE, left knee flexed in stance & minimal knee flexion in swing.      TODAY'S TREATMENT: 12/05/2021 Therapeutic Exercise:  UBE LE only lvl 3 full revolutions seat 8 10 mins  Supine AROM heel slide c SLR combo 2 x 10 Lt leg  Machine LAQ Double leg up, Lt leg eccentric lowering (range to tolerance for extension) 3 x 10 5 lbs  Runner stretch on incline board 30 sec x 3 Lt leg posterior, heel on ground  Heel prop c vaso to tolerance.     TherActivity (performed to improve functional movement including transfers, stairs) Leg press Double leg 56 lbs  x 15, Lt leg only 31 lbs 2 x 12 4 inch step on over and down WB on Lt 2 x 10 c single rail assist.   Modalities: Vaso medium compression left knee 34* 10 min  12/03/2021 Therapeutic Exercise:  Recumbent bike 8 mins partial circles 5 mins, full revolutions 3 mins , seat 5  Seated SLR 2 x 10  Machine LAQ Double  leg up, Lt leg eccentric lowering (range to tolerance for extension) 3 x 10 5 lbs  Runner stretch on incline board 30 sec x 3 Lt leg posterior, heel on ground  Seated quad set review c trial rep  TherActivity (performed to improve functional movement including transfers, stairs) Leg press Double leg 56 lbs  x 15, Lt leg only 31 lbs 2 x 12 6 inch step fwd Rt leg 2 x 10   Manual  Seated and supine Lt knee flexion distraction ir mobilization c movement.    Modalities: Vaso medium compression left knee 34* 10 min  11/28/2021 Therapeutic Exercise:  Recumbent bike 8 mins partial circles , seat 4  Machine LAQ Double leg up, Lt leg eccentric lowering (range to tolerance for extension) 2 x 10 5 lbs  Runner stretch on incline board 30 sec x 3 Lt leg posterior, heel on ground  Seated Lt knee flexion c overpressure from Rt knee 15 sec x 6  Supine SLR x 10   TherActivity (performed to improve functional movement including transfers, stairs) Leg press Lt leg only 31 lbs 2 x 10 6 inch step fwd Rt leg 2 x 10 green band TKE focus   Manual  Seated and supine Lt knee flexion distraction ir mobilization c movement.    Modalities: Vaso medium compression left knee 34* 10 min    HOME EXERCISE PROGRAM: Access Code: [email or web address] URL: [email or web address] Date: 11/22/2021 Prepared by: Scot Jun  Exercises - Supine Heel Slide with Strap  - 2-3 x daily - 7 x weekly - 2-3 sets - 10 reps - 5 seconds hold - Supine Straight Leg Raises  - 2-3 x daily - 7 x weekly - 2-3 sets - 10 reps - 5 seconds hold - Seated Knee Flexion Extension AROM   - 2-4 x daily - 7 x weekly - 2-3 sets - 10 reps - 5 seconds hold - Seated Long Arc Quad  - 2-3 x daily - 7 x weekly - 2 sets - 10 reps - 5 seconds hold - Seated Hamstring Stretch with Strap  - 2-4 x daily - 7 x weekly - 1 sets - 3 reps - 20-30 seconds hold - Seated Straight Leg Heel Taps  - 1-2 x daily - 7 x weekly - 1-2 sets - 10 reps - Seated Quad  Set (Mirrored)  - 3-5 x daily - 7 x weekly - 1 sets - 10 reps - 5 hold   ASSESSMENT:   CLINICAL IMPRESSION: Improvement noted in WB control in step down from 4 inch step/curb height.  Progression as able to higher steps to help reduced difficulty c home navigation on stairs.  Continued skilled PT for strengthening and full range indicated.    OBJECTIVE IMPAIRMENTS Abnormal gait, decreased activity tolerance, decreased balance, decreased knowledge of condition, decreased knowledge of use of DME, decreased mobility, difficulty walking, decreased ROM, decreased strength, increased edema, increased muscle spasms, impaired flexibility, postural dysfunction, and pain.    ACTIVITY LIMITATIONS carrying, lifting, bending, sitting, standing, squatting, sleeping, stairs, transfers, and locomotion level   PARTICIPATION LIMITATIONS: meal prep, cleaning, laundry, driving, community activity, and occupation   [place] and 1-2 comorbidities: see PMH above  are also affecting patient's functional outcome.    REHAB POTENTIAL: Good   CLINICAL DECISION MAKING: Stable/uncomplicated   EVALUATION COMPLEXITY: Low     GOALS: Goals reviewed with patient? Yes   SHORT TERM GOALS: Target date: 11/29/2021   Patient independent and  verbalizes compliance with initial HEP Baseline: SEE OBJECTIVE DATA Goal status: MET - assessed 11/22/2021   2.  Patient reports 50% improvement in left knee pain. Baseline: SEE OBJECTIVE DATA Goal status: partially met - assessed 11/22/2021   3.  PROM left knee extension -3* to flexion 90* Baseline: SEE OBJECTIVE DATA Goal status: partially met - assessed 11/22/2021   LONG TERM GOALS: Target date: 12/27/2021   Patient will improve FOTO score to 61% Baseline: SEE OBJECTIVE DATA Goal status: on going  - assessed 11/22/2021   2.  Patient reports left knee pain </= 1/10 with standing & gait activities.  Baseline: SEE OBJECTIVE DATA Goal status: on going  - assessed  11/22/2021   3.  Left Knee PROM 0* extension to 100* flexion Baseline: SEE OBJECTIVE DATA Goal status: on going  - assessed 11/22/2021   4.  Left Knee AROM seated -1* extension to 95* flexion Baseline: SEE OBJECTIVE DATA Goal status: on going  - assessed 11/22/2021   5.  Patient ambulates >500' community distances including negotiating ramps, curbs & stairs without device independently. Baseline: SEE OBJECTIVE DATA Goal status: on going  - assessed 11/22/2021   PLAN: PT FREQUENCY: 2-3 x/wk   PT DURATION:  9 weeks   PLANNED INTERVENTIONS: Therapeutic exercises, Therapeutic activity, Neuromuscular re-education, Balance training, Gait training, Patient/Family education, Joint mobilization, Stair training, DME instructions, Electrical stimulation, Cryotherapy, Moist heat, Taping, Vasopneumatic device, and Manual therapy   PLAN FOR NEXT SESSION: Quad strengthening in NWB/WB activity.   Scot Jun, PT, DPT, OCS, ATC 12/05/21  1:35 PM

## 2021-12-06 NOTE — Telephone Encounter (Signed)
LMTCB

## 2021-12-09 ENCOUNTER — Encounter: Payer: Self-pay | Admitting: Physical Therapy

## 2021-12-09 ENCOUNTER — Ambulatory Visit: Payer: Medicare Other | Admitting: Physical Therapy

## 2021-12-09 DIAGNOSIS — M25662 Stiffness of left knee, not elsewhere classified: Secondary | ICD-10-CM

## 2021-12-09 DIAGNOSIS — R2681 Unsteadiness on feet: Secondary | ICD-10-CM

## 2021-12-09 DIAGNOSIS — M25562 Pain in left knee: Secondary | ICD-10-CM

## 2021-12-09 DIAGNOSIS — R2689 Other abnormalities of gait and mobility: Secondary | ICD-10-CM

## 2021-12-09 DIAGNOSIS — R600 Localized edema: Secondary | ICD-10-CM | POA: Diagnosis not present

## 2021-12-09 DIAGNOSIS — M6281 Muscle weakness (generalized): Secondary | ICD-10-CM | POA: Diagnosis not present

## 2021-12-09 NOTE — Therapy (Signed)
OUTPATIENT PHYSICAL THERAPY TREATMENT NOTE   Patient Name: Tara Davis MRN: [id number] DOB:[birth year], 76 y.o., female Today's Date: 12/09/2021  PCP: Glendale Chard, MD REFERRING PROVIDER: Suzan Slick, NP   END OF SESSION:   PT End of Session - 12/09/21 1257     Visit Number 12    Number of Visits 20    Date for PT Re-Evaluation 12/30/21    Authorization Type BCBS Medicare    Authorization Time Period $10 COPAY  Deductible ($0.00)Deductible has been met  Out-of-Pocket Limit ($3,950.00) $455.62 Paid  $3,494.38 to go    Progress Note Due on Visit 17    PT Start Time 1300    PT Stop Time 1348    PT Time Calculation (min) 48 min    Activity Tolerance Patient tolerated treatment well    Behavior During Therapy WFL for tasks assessed/performed               Past Medical History:  Diagnosis Date   Adenomatous colon polyp    Anxiety    Arthritis    Bilateral cataracts    within 6 mos.   Depression    Diverticulosis    Fatty liver    GERD (gastroesophageal reflux disease)    H. pylori infection    Hyperlipidemia    Ulcer 1988   Uterine fibroid    Past Surgical History:  Procedure Laterality Date   APPENDECTOMY     CATARACT EXTRACTION, BILATERAL     CHOLECYSTECTOMY     COLONOSCOPY     spontaneous vaginal delivery     x2   TONSILLECTOMY     TOTAL KNEE ARTHROPLASTY Left 10/11/2021   Procedure: LEFT TOTAL KNEE ARTHROPLASTY;  Surgeon: Newt Minion, MD;  Location: [place];  Service: Orthopedics;  Laterality: Left;   WISDOM TOOTH EXTRACTION     Patient Active Problem List   Diagnosis Date Noted   Total knee replacement status, left 10/11/2021   Unilateral primary osteoarthritis, left knee    Personal history of colonic polyps 08/07/2020   Gastroesophageal reflux disease 08/23/2019   Tobacco abuse 01/12/2018   Cigarette smoker 01/12/2018   IBS 07/05/2008    REFERRING DIAG: M17.12 (ICD-10-CM) - Unilateral primary osteoarthritis, left knee  THERAPY DIAG:   Stiffness of left knee, not elsewhere classified  Acute pain of left knee  Muscle weakness (generalized)  Localized edema  Unsteadiness on feet  Other abnormalities of gait and mobility  Rationale for Evaluation and Treatment Rehabilitation  PERTINENT HISTORY: OA, Left TKR 10/11/21, anxiety, depresssion, IBS diverticulosis  PRECAUTIONS: None  SUBJECTIVE:  doing well, having some lateral leg pain PAIN:  NPRS scale:  2 /10 at worst Pain location: left knee Pain description: stiffness, ache Aggravating factors: stiffness after inactivity, nighttime pains, after stairs Relieving factors: ice, heat   OBJECTIVE: (objective measures completed at initial evaluation unless otherwise dated)   DIAGNOSTIC FINDINGS: 10/24/2021: 2 view radiographs of the left knee shows a stable total knee arthroplasty  no complicating features.   PATIENT SURVEYS:  11/22/2021:  FOTO 55% 10/29/21: FOTO   43% and target 61%   COGNITION: 10/29/21:  Overall cognitive status: Within functional limits for tasks assessed                        POSTURE: 10/29/21:  weight shift right   EDEMA: 10/29/21:  RLE:  above knee 42.3cm around knee 40.3cm below knee  34.8cm 10/29/21:  LLE:  above knee 48cm around  knee 44.3cm below knee 39.3cm   PALPATION: 10/29/21:  pt has ~6 areas on incision that appear to have internal sutures working out. Edema pocket and tenderness proximal incision. Tenderness along incision & joint line.     LOWER EXTREMITY ROM:   ROM P:passive A:active Right Eval 10/29/21 Left Eval 10/29/21 Left 11/11/21 Left 11/22/2021 Left 11/28/2021  Knee flexion   Seated P: 89* Supine  A: 65* Supine P: 94* Supine AROM heel slide: 102  Supine heel slide AROM 109   Knee extension   Seated A: -18* Supine P: -9*  Seated LAQ -15  Supine quad set AROM -5 Seated LAQ : -12  Supine quad set: -4   (Blank rows = not tested)   LOWER EXTREMITY MMT:   MMT Left 10/29/21 Right 11/22/2021 Left 11/22/2021  Left 12/03/2021  Knee flexion 3-/5     Knee extension 3-/5 5/5 36.8, 35.8 lbs 4/5 19.2, 18.5 lbs 4/5 20, 19 lbs   (Blank rows = not tested)     GAIT: 11/15/2021:  independent ambulation in clinic today (reported no cane use at home)  10/29/21:  Distance walked: 100' Assistive device utilized: Single point cane Level of assistance: SBA verbal cues Comments: antalgic with decreased stance LLE, left knee flexed in stance & minimal knee flexion in swing.      TODAY'S TREATMENT: 12/09/21 Therex UBE LE only L3 x 8 min; seat 8-9 Leg Press bil 2x15 56#; LLE only 2x15 31# Slant board stretch 3x30 sec Machine LAQ Double leg up, Lt leg eccentric lowering (range to tolerance for extension) 3 x 10 5 lbs Seated SLR 2x10 - mod cues needed for quad activation SAQ 2x10; 3# Lt  Neuro Re-ed Tandem Stance with intermittent UE support 3x15 sec bil Hip abduction 2 x 10 reps bil with L2 band and min UE support for balance Hip extension 2 x 10 reps bil with L2 band and min UE support for balance  Vaso: Lt knee x 10 min; mod pressure, 34 deg   12/05/2021 Therapeutic Exercise:  UBE LE only lvl 3 full revolutions seat 8 10 mins  Supine AROM heel slide c SLR combo 2 x 10 Lt leg  Machine LAQ Double leg up, Lt leg eccentric lowering (range to tolerance for extension) 3 x 10 5 lbs  Runner stretch on incline board 30 sec x 3 Lt leg posterior, heel on ground  Heel prop c vaso to tolerance.     TherActivity (performed to improve functional movement including transfers, stairs) Leg press Double leg 56 lbs  x 15, Lt leg only 31 lbs 2 x 12 4 inch step on over and down WB on Lt 2 x 10 c single rail assist.   Modalities: Vaso medium compression left knee 34* 10 min  12/03/2021 Therapeutic Exercise:  Recumbent bike 8 mins partial circles 5 mins, full revolutions 3 mins , seat 5  Seated SLR 2 x 10   Machine LAQ Double leg up, Lt leg eccentric lowering (range to tolerance for extension) 3 x 10 5 lbs  Runner  stretch on incline board 30 sec x 3 Lt leg posterior, heel on ground  Seated quad set review c trial rep  TherActivity (performed to improve functional movement including transfers, stairs) Leg press Double leg 56 lbs  x 15, Lt leg only 31 lbs 2 x 12 6 inch step fwd Rt leg 2 x 10   Manual  Seated and supine Lt knee flexion distraction ir mobilization c movement.  Modalities: Vaso medium compression left knee 34* 10 min   HOME EXERCISE PROGRAM: Access Code: [email or web address] URL: [email or web address] Date: 11/22/2021 Prepared by: Scot Jun  Exercises - Supine Heel Slide with Strap  - 2-3 x daily - 7 x weekly - 2-3 sets - 10 reps - 5 seconds hold - Supine Straight Leg Raises  - 2-3 x daily - 7 x weekly - 2-3 sets - 10 reps - 5 seconds hold - Seated Knee Flexion Extension AROM   - 2-4 x daily - 7 x weekly - 2-3 sets - 10 reps - 5 seconds hold - Seated Long Arc Quad  - 2-3 x daily - 7 x weekly - 2 sets - 10 reps - 5 seconds hold - Seated Hamstring Stretch with Strap  - 2-4 x daily - 7 x weekly - 1 sets - 3 reps - 20-30 seconds hold - Seated Straight Leg Heel Taps  - 1-2 x daily - 7 x weekly - 1-2 sets - 10 reps - Seated Quad Set (Mirrored)  - 3-5 x daily - 7 x weekly - 1 sets - 10 reps - 5 hold   ASSESSMENT:   CLINICAL IMPRESSION: Continued work on quad activation today with improved TKE noted.  Overall will continue to benefit from PT to maximize function.   OBJECTIVE IMPAIRMENTS Abnormal gait, decreased activity tolerance, decreased balance, decreased knowledge of condition, decreased knowledge of use of DME, decreased mobility, difficulty walking, decreased ROM, decreased strength, increased edema, increased muscle spasms, impaired flexibility, postural dysfunction, and pain.    ACTIVITY LIMITATIONS carrying, lifting, bending, sitting, standing, squatting, sleeping, stairs, transfers, and locomotion level   PARTICIPATION LIMITATIONS: meal prep, cleaning, laundry,  driving, community activity, and occupation   [place] and 1-2 comorbidities: see PMH above  are also affecting patient's functional outcome.    REHAB POTENTIAL: Good   CLINICAL DECISION MAKING: Stable/uncomplicated   EVALUATION COMPLEXITY: Low     GOALS: Goals reviewed with patient? Yes   SHORT TERM GOALS: Target date: 11/29/2021   Patient independent and verbalizes compliance with initial HEP Baseline: SEE OBJECTIVE DATA Goal status: MET - assessed 11/22/2021   2.  Patient reports 50% improvement in left knee pain. Baseline: SEE OBJECTIVE DATA Goal status: partially met - assessed 11/22/2021   3.  PROM left knee extension -3* to flexion 90* Baseline: SEE OBJECTIVE DATA Goal status: partially met - assessed 11/22/2021   LONG TERM GOALS: Target date: 12/27/2021   Patient will improve FOTO score to 61% Baseline: SEE OBJECTIVE DATA Goal status: on going  - assessed 11/22/2021   2.  Patient reports left knee pain </= 1/10 with standing & gait activities.  Baseline: SEE OBJECTIVE DATA Goal status: on going  - assessed 11/22/2021   3.  Left Knee PROM 0* extension to 100* flexion Baseline: SEE OBJECTIVE DATA Goal status: on going  - assessed 11/22/2021   4.  Left Knee AROM seated -1* extension to 95* flexion Baseline: SEE OBJECTIVE DATA Goal status: on going  - assessed 11/22/2021   5.  Patient ambulates >500' community distances including negotiating ramps, curbs & stairs without device independently. Baseline: SEE OBJECTIVE DATA Goal status: on going  - assessed 11/22/2021   PLAN: PT FREQUENCY: 2-3 x/wk   PT DURATION:  9 weeks   PLANNED INTERVENTIONS: Therapeutic exercises, Therapeutic activity, Neuromuscular re-education, Balance training, Gait training, Patient/Family education, Joint mobilization, Stair training, DME instructions, Electrical stimulation, Cryotherapy, Moist heat, Taping, Vasopneumatic device, and Manual therapy   PLAN  FOR NEXT SESSION:  Quad strengthening in NWB/WB activity, balance exercise   Laureen Abrahams, PT, DPT 12/09/21 1:51 PM

## 2021-12-11 ENCOUNTER — Ambulatory Visit: Payer: Medicare Other | Admitting: Rehabilitative and Restorative Service Providers"

## 2021-12-11 ENCOUNTER — Encounter: Payer: Self-pay | Admitting: Rehabilitative and Restorative Service Providers"

## 2021-12-11 DIAGNOSIS — R2689 Other abnormalities of gait and mobility: Secondary | ICD-10-CM

## 2021-12-11 DIAGNOSIS — M25562 Pain in left knee: Secondary | ICD-10-CM | POA: Diagnosis not present

## 2021-12-11 DIAGNOSIS — R2681 Unsteadiness on feet: Secondary | ICD-10-CM

## 2021-12-11 DIAGNOSIS — M25662 Stiffness of left knee, not elsewhere classified: Secondary | ICD-10-CM | POA: Diagnosis not present

## 2021-12-11 DIAGNOSIS — M6281 Muscle weakness (generalized): Secondary | ICD-10-CM

## 2021-12-11 DIAGNOSIS — R600 Localized edema: Secondary | ICD-10-CM

## 2021-12-11 NOTE — Therapy (Signed)
OUTPATIENT PHYSICAL THERAPY TREATMENT NOTE   Patient Name: Tara Davis MRN: [id number] DOB:1946/03/22, 76 y.o., female Today's Date: 12/11/2021  PCP: Glendale Chard, MD REFERRING PROVIDER: Suzan Slick, NP   END OF SESSION:   PT End of Session - 12/11/21 1328     Visit Number 13    Number of Visits 20    Date for PT Re-Evaluation 12/30/21    Authorization Type BCBS Medicare    Authorization Time Period $10 COPAY  Deductible ($0.00)Deductible has been met  Out-of-Pocket Limit ($3,950.00) $455.62 Paid  $3,494.38 to go    Progress Note Due on Visit 17    PT Start Time 1259    PT Stop Time 1350    PT Time Calculation (min) 51 min    Activity Tolerance Patient tolerated treatment well    Behavior During Therapy WFL for tasks assessed/performed                Past Medical History:  Diagnosis Date   Adenomatous colon polyp    Anxiety    Arthritis    Bilateral cataracts    within 6 mos.   Depression    Diverticulosis    Fatty liver    GERD (gastroesophageal reflux disease)    H. pylori infection    Hyperlipidemia    Ulcer 1988   Uterine fibroid    Past Surgical History:  Procedure Laterality Date   APPENDECTOMY     CATARACT EXTRACTION, BILATERAL     CHOLECYSTECTOMY     COLONOSCOPY     spontaneous vaginal delivery     x2   TONSILLECTOMY     TOTAL KNEE ARTHROPLASTY Left 10/11/2021   Procedure: LEFT TOTAL KNEE ARTHROPLASTY;  Surgeon: Newt Minion, MD;  Location: [facility];  Service: Orthopedics;  Laterality: Left;   WISDOM TOOTH EXTRACTION     Patient Active Problem List   Diagnosis Date Noted   Total knee replacement status, left 10/11/2021   Unilateral primary osteoarthritis, left knee    Personal history of colonic polyps 08/07/2020   Gastroesophageal reflux disease 08/23/2019   Tobacco abuse 01/12/2018   Cigarette smoker 01/12/2018   IBS 07/05/2008    REFERRING DIAG: M17.12 (ICD-10-CM) - Unilateral primary osteoarthritis, left knee  THERAPY DIAG:   Stiffness of left knee, not elsewhere classified  Acute pain of left knee  Muscle weakness (generalized)  Localized edema  Unsteadiness on feet  Other abnormalities of gait and mobility  Rationale for Evaluation and Treatment Rehabilitation  PERTINENT HISTORY: OA, Left TKR 10/11/21, anxiety, depresssion, IBS diverticulosis  PRECAUTIONS: None  SUBJECTIVE:  doing well, having some lateral leg pain PAIN:  NPRS scale:  2 /10 at worst Pain location: left knee Pain description: stiffness, ache Aggravating factors: stiffness after inactivity, nighttime pains, after stairs Relieving factors: ice, heat   OBJECTIVE: (objective measures completed at initial evaluation unless otherwise dated)   DIAGNOSTIC FINDINGS: 10/24/2021: 2 view radiographs of the left knee shows a stable total knee arthroplasty  no complicating features.   PATIENT SURVEYS:  11/22/2021:  FOTO 55% 10/29/21: FOTO   43% and target 61%   COGNITION: 10/29/21:  Overall cognitive status: Within functional limits for tasks assessed                        POSTURE: 10/29/21:  weight shift right   EDEMA: 10/29/21:  RLE:  above knee 42.3cm around knee 40.3cm below knee  34.8cm 10/29/21:  LLE:  above knee 48cm  around knee 44.3cm below knee 39.3cm   PALPATION: 10/29/21:  pt has ~6 areas on incision that appear to have internal sutures working out. Edema pocket and tenderness proximal incision. Tenderness along incision & joint line.     LOWER EXTREMITY ROM:   ROM P:passive A:active Right Eval 10/29/21 Left Eval 10/29/21 Left 11/11/21 Left 11/22/2021 Left 11/28/2021 Left 12/11/2021  Knee flexion   Seated P: 89* Supine  A: 65* Supine P: 94* Supine AROM heel slide: 102  Supine heel slide AROM 109  Supine AROM heel slide: 110 c pain end range noted   Knee extension   Seated A: -18* Supine P: -9*  Seated LAQ -15  Supine quad set AROM -5 Seated LAQ : -12  Supine quad set: -4   Passive heel prop -3   (Blank rows =  not tested)   LOWER EXTREMITY MMT:   MMT Left 10/29/21 Right 11/22/2021 Left 11/22/2021 Left 12/03/2021  Knee flexion 3-/5     Knee extension 3-/5 5/5 36.8, 35.8 lbs 4/5 19.2, 18.5 lbs 4/5 20, 19 lbs   (Blank rows = not tested)     GAIT: 11/15/2021:  independent ambulation in clinic today (reported no cane use at home)  10/29/21:  Distance walked: 100' Assistive device utilized: Single point cane Level of assistance: SBA verbal cues Comments: antalgic with decreased stance LLE, left knee flexed in stance & minimal knee flexion in swing.      TODAY'S TREATMENT: 12/11/2021 Therex Recumbent bike 3/4 revolutions 9 mins seat 5 Leg Press Lt leg  x 15, 37 lbs x 15 43 lbs Seated TKE stretch prop up 2.5 lbs 5 mins Gastroc runner stretch at wall 15 sec x 3 Seated LAQ 6 lbs 2 x 15 Lt Seated quad set Lt 5 sec hold x 5, SLR Lt 2 x 10 c slow eccentric lowering focus.  Sit to stand 19 inch table no UE assist x 10   Manual: Supine Lt knee flexion/distraction, IR mobilization c movement for Lt knee flexion mobility gains.   Vaso: Lt knee x 10 min; mod pressure, 34 deg  12/09/21 Therex UBE LE only L3 x 8 min; seat 8-9 Leg Press bil 2x15 56#; LLE only 2x15 31# Slant board stretch 3x30 sec Machine LAQ Double leg up, Lt leg eccentric lowering (range to tolerance for extension) 3 x 10 5 lbs Seated SLR 2x10 - mod cues needed for quad activation SAQ 2x10; 3# Lt  Neuro Re-ed Tandem Stance with intermittent UE support 3x15 sec bil Hip abduction 2 x 10 reps bil with L2 band and min UE support for balance Hip extension 2 x 10 reps bil with L2 band and min UE support for balance  Vaso: Lt knee x 10 min; mod pressure, 34 deg   12/05/2021 Therapeutic Exercise:  UBE LE only lvl 3 full revolutions seat 8 10 mins  Supine AROM heel slide c SLR combo 2 x 10 Lt leg  Machine LAQ Double leg up, Lt leg eccentric lowering (range to tolerance for extension) 3 x 10 5 lbs  Runner stretch on incline board 30  sec x 3 Lt leg posterior, heel on ground  Heel prop c vaso to tolerance.     TherActivity (performed to improve functional movement including transfers, stairs) Leg press Double leg 56 lbs  x 15, Lt leg only 31 lbs 2 x 12 4 inch step on over and down WB on Lt 2 x 10 c single rail assist.   Modalities: Vaso medium  compression left knee 34* 10 min   HOME EXERCISE PROGRAM: Access Code: [email or web address] URL: [email or web address] Date: 11/22/2021 Prepared by: Scot Jun  Exercises - Supine Heel Slide with Strap  - 2-3 x daily - 7 x weekly - 2-3 sets - 10 reps - 5 seconds hold - Supine Straight Leg Raises  - 2-3 x daily - 7 x weekly - 2-3 sets - 10 reps - 5 seconds hold - Seated Knee Flexion Extension AROM   - 2-4 x daily - 7 x weekly - 2-3 sets - 10 reps - 5 seconds hold - Seated Long Arc Quad  - 2-3 x daily - 7 x weekly - 2 sets - 10 reps - 5 seconds hold - Seated Hamstring Stretch with Strap  - 2-4 x daily - 7 x weekly - 1 sets - 3 reps - 20-30 seconds hold - Seated Straight Leg Heel Taps  - 1-2 x daily - 7 x weekly - 1-2 sets - 10 reps - Seated Quad Set (Mirrored)  - 3-5 x daily - 7 x weekly - 1 sets - 10 reps - 5 hold   ASSESSMENT:   CLINICAL IMPRESSION: Continued reported difficulty c sleeping (times that are better then before per Pt).  Continued education and discussion about extension mobility deficits and its possible role in bed time symptoms due to tightness and relative immobility during sleep.  Flexion mobility quantity has reached goals but quality to improve.    OBJECTIVE IMPAIRMENTS Abnormal gait, decreased activity tolerance, decreased balance, decreased knowledge of condition, decreased knowledge of use of DME, decreased mobility, difficulty walking, decreased ROM, decreased strength, increased edema, increased muscle spasms, impaired flexibility, postural dysfunction, and pain.    ACTIVITY LIMITATIONS carrying, lifting, bending, sitting, standing, squatting,  sleeping, stairs, transfers, and locomotion level   PARTICIPATION LIMITATIONS: meal prep, cleaning, laundry, driving, community activity, and occupation   [place] and 1-2 comorbidities: see PMH above  are also affecting patient's functional outcome.    REHAB POTENTIAL: Good   CLINICAL DECISION MAKING: Stable/uncomplicated   EVALUATION COMPLEXITY: Low     GOALS: Goals reviewed with patient? Yes   SHORT TERM GOALS: Target date: 11/29/2021   Patient independent and verbalizes compliance with initial HEP Baseline: SEE OBJECTIVE DATA Goal status: MET - assessed 11/22/2021   2.  Patient reports 50% improvement in left knee pain. Baseline: SEE OBJECTIVE DATA Goal status: partially met - assessed 11/22/2021   3.  PROM left knee extension -3* to flexion 90* Baseline: SEE OBJECTIVE DATA Goal status: partially met - assessed 11/22/2021   LONG TERM GOALS: Target date: 12/27/2021   Patient will improve FOTO score to 61% Baseline: SEE OBJECTIVE DATA Goal status: on going  - assessed 11/22/2021   2.  Patient reports left knee pain </= 1/10 with standing & gait activities.  Baseline: SEE OBJECTIVE DATA Goal status: on going  - assessed 11/22/2021   3.  Left Knee PROM 0* extension to 100* flexion Baseline: SEE OBJECTIVE DATA Goal status: on going  - assessed 11/22/2021   4.  Left Knee AROM seated -1* extension to 95* flexion Baseline: SEE OBJECTIVE DATA Goal status: on going  - assessed 11/22/2021   5.  Patient ambulates >500' community distances including negotiating ramps, curbs & stairs without device independently. Baseline: SEE OBJECTIVE DATA Goal status: on going  - assessed 11/22/2021   PLAN: PT FREQUENCY: 2-3 x/wk   PT DURATION:  9 weeks   PLANNED INTERVENTIONS: Therapeutic exercises, Therapeutic activity, Neuromuscular re-education,  Balance training, Gait training, Patient/Family education, Joint mobilization, Stair training, DME instructions, Electrical  stimulation, Cryotherapy, Moist heat, Taping, Vasopneumatic device, and Manual therapy   PLAN FOR NEXT SESSION: TKE and quad strengthening, improved quality of knee flexion symptoms.    Scot Jun, PT, DPT, OCS, ATC 12/11/21  1:45 PM

## 2021-12-17 ENCOUNTER — Ambulatory Visit: Payer: Medicare Other | Admitting: Rehabilitative and Restorative Service Providers"

## 2021-12-17 ENCOUNTER — Encounter: Payer: Self-pay | Admitting: Rehabilitative and Restorative Service Providers"

## 2021-12-17 DIAGNOSIS — M25662 Stiffness of left knee, not elsewhere classified: Secondary | ICD-10-CM | POA: Diagnosis not present

## 2021-12-17 DIAGNOSIS — M6281 Muscle weakness (generalized): Secondary | ICD-10-CM | POA: Diagnosis not present

## 2021-12-17 DIAGNOSIS — M25562 Pain in left knee: Secondary | ICD-10-CM

## 2021-12-17 DIAGNOSIS — R600 Localized edema: Secondary | ICD-10-CM | POA: Diagnosis not present

## 2021-12-17 DIAGNOSIS — R2681 Unsteadiness on feet: Secondary | ICD-10-CM

## 2021-12-17 DIAGNOSIS — R2689 Other abnormalities of gait and mobility: Secondary | ICD-10-CM

## 2021-12-17 NOTE — Therapy (Signed)
OUTPATIENT PHYSICAL THERAPY TREATMENT NOTE   Patient Name: Tara Davis MRN: [id number] DOB:1946-01-13, 76 y.o., female Today's Date: 12/17/2021  PCP: Glendale Chard, MD REFERRING PROVIDER: Suzan Slick, NP   END OF SESSION:   PT End of Session - 12/17/21 1257     Visit Number 14    Number of Visits 20    Date for PT Re-Evaluation 12/30/21    Authorization Type BCBS Medicare    Authorization Time Period $10 COPAY  Deductible ($0.00)Deductible has been met  Out-of-Pocket Limit ($3,950.00) $455.62 Paid  $3,494.38 to go    Progress Note Due on Visit 17    PT Start Time 1258    PT Stop Time 1350    PT Time Calculation (min) 52 min    Activity Tolerance Patient tolerated treatment well    Behavior During Therapy WFL for tasks assessed/performed                 Past Medical History:  Diagnosis Date   Adenomatous colon polyp    Anxiety    Arthritis    Bilateral cataracts    within 6 mos.   Depression    Diverticulosis    Fatty liver    GERD (gastroesophageal reflux disease)    H. pylori infection    Hyperlipidemia    Ulcer 1988   Uterine fibroid    Past Surgical History:  Procedure Laterality Date   APPENDECTOMY     CATARACT EXTRACTION, BILATERAL     CHOLECYSTECTOMY     COLONOSCOPY     spontaneous vaginal delivery     x2   TONSILLECTOMY     TOTAL KNEE ARTHROPLASTY Left 10/11/2021   Procedure: LEFT TOTAL KNEE ARTHROPLASTY;  Surgeon: Newt Minion, MD;  Location: [facility];  Service: Orthopedics;  Laterality: Left;   WISDOM TOOTH EXTRACTION     Patient Active Problem List   Diagnosis Date Noted   Total knee replacement status, left 10/11/2021   Unilateral primary osteoarthritis, left knee    Personal history of colonic polyps 08/07/2020   Gastroesophageal reflux disease 08/23/2019   Tobacco abuse 01/12/2018   Cigarette smoker 01/12/2018   IBS 07/05/2008    REFERRING DIAG: M17.12 (ICD-10-CM) - Unilateral primary osteoarthritis, left knee  THERAPY DIAG:   Stiffness of left knee, not elsewhere classified  Acute pain of left knee  Muscle weakness (generalized)  Localized edema  Unsteadiness on feet  Other abnormalities of gait and mobility  Rationale for Evaluation and Treatment Rehabilitation  PERTINENT HISTORY: OA, Left TKR 10/11/21, anxiety, depresssion, IBS diverticulosis  PRECAUTIONS: None  SUBJECTIVE:  doing well, having some lateral leg pain PAIN:  NPRS scale:  2 /10 at worst Pain location: left knee Pain description: stiffness, ache Aggravating factors: stiffness after inactivity, nighttime pains, after stairs Relieving factors: ice, heat   OBJECTIVE: (objective measures completed at initial evaluation unless otherwise dated)   DIAGNOSTIC FINDINGS: 10/24/2021: 2 view radiographs of the left knee shows a stable total knee arthroplasty  no complicating features.   PATIENT SURVEYS:  11/22/2021:  FOTO 55% 10/29/21: FOTO   43% and target 61%   COGNITION: 10/29/21:  Overall cognitive status: Within functional limits for tasks assessed                        POSTURE: 10/29/21:  weight shift right   EDEMA: 10/29/21:  RLE:  above knee 42.3cm around knee 40.3cm below knee  34.8cm 10/29/21:  LLE:  above knee  48cm around knee 44.3cm below knee 39.3cm   PALPATION: 10/29/21:  pt has ~6 areas on incision that appear to have internal sutures working out. Edema pocket and tenderness proximal incision. Tenderness along incision & joint line.     LOWER EXTREMITY ROM:   ROM P:passive A:active Right Eval 10/29/21 Left Eval 10/29/21 Left 11/11/21 Left 11/22/2021 Left 11/28/2021 Left 12/11/2021  Knee flexion   Seated P: 89* Supine  A: 65* Supine P: 94* Supine AROM heel slide: 102  Supine heel slide AROM 109  Supine AROM heel slide: 110 c pain end range noted   Knee extension   Seated A: -18* Supine P: -9*  Seated LAQ -15  Supine quad set AROM -5 Seated LAQ : -12  Supine quad set: -4   Passive heel prop -3   (Blank rows =  not tested)   LOWER EXTREMITY MMT:   MMT Left 10/29/21 Right 11/22/2021 Left 11/22/2021 Left 12/03/2021  Knee flexion 3-/5     Knee extension 3-/5 5/5 36.8, 35.8 lbs 4/5 19.2, 18.5 lbs 4/5 20, 19 lbs   (Blank rows = not tested)     GAIT: 11/15/2021:  independent ambulation in clinic today (reported no cane use at home)  10/29/21:  Distance walked: 100' Assistive device utilized: Single point cane Level of assistance: SBA verbal cues Comments: antalgic with decreased stance LLE, left knee flexed in stance & minimal knee flexion in swing.      TODAY'S TREATMENT: 12/17/2021 Therex UBE LE only lvl 1 full revolutions seat 10 10 mins Seated TKE stretch prop up 2.5 lbs 5 mins Gastroc runner stretch on incline board 15 sec x 3 Seated Lt LAQ eccentric lowering on Lt 3 x 10 5 lbs    TherActivity: Leg Press Lt leg  x 15, 37 lbs x 15 43 lbs Lateral step down WB on Lt 2 x 10 4 inch step Step up 6 inch step Lt leg 2 x 10  Vaso: Lt knee x 10 min; mod pressure, 34 deg  12/11/2021 Therex Recumbent bike 3/4 revolutions 9 mins seat 5 Leg Press Lt leg  x 15, 37 lbs x 15 43 lbs Seated TKE stretch prop up 2.5 lbs 5 mins Gastroc runner stretch at wall 15 sec x 3 Seated LAQ 6 lbs 2 x 15 Lt Seated quad set Lt 5 sec hold x 5, SLR Lt 2 x 10 c slow eccentric lowering focus.  Sit to stand 19 inch table no UE assist x 10   Manual: Supine Lt knee flexion/distraction, IR mobilization c movement for Lt knee flexion mobility gains.   Vaso: Lt knee x 10 min; mod pressure, 34 deg  12/09/21 Therex UBE LE only L3 x 8 min; seat 8-9 Leg Press bil 2x15 56#; LLE only 2x15 31# Slant board stretch 3x30 sec Machine LAQ Double leg up, Lt leg eccentric lowering (range to tolerance for extension) 3 x 10 5 lbs Seated SLR 2x10 - mod cues needed for quad activation SAQ 2x10; 3# Lt  Neuro Re-ed Tandem Stance with intermittent UE support 3x15 sec bil Hip abduction 2 x 10 reps bil with L2 band and min UE support  for balance Hip extension 2 x 10 reps bil with L2 band and min UE support for balance  Vaso: Lt knee x 10 min; mod pressure, 34 deg    HOME EXERCISE PROGRAM: Access Code: [email or web address] URL: [email or web address] Date: 11/22/2021 Prepared by: Scot Jun  Exercises - Supine Heel Slide with Strap  - 2-3 x  daily - 7 x weekly - 2-3 sets - 10 reps - 5 seconds hold - Supine Straight Leg Raises  - 2-3 x daily - 7 x weekly - 2-3 sets - 10 reps - 5 seconds hold - Seated Knee Flexion Extension AROM   - 2-4 x daily - 7 x weekly - 2-3 sets - 10 reps - 5 seconds hold - Seated Long Arc Quad  - 2-3 x daily - 7 x weekly - 2 sets - 10 reps - 5 seconds hold - Seated Hamstring Stretch with Strap  - 2-4 x daily - 7 x weekly - 1 sets - 3 reps - 20-30 seconds hold - Seated Straight Leg Heel Taps  - 1-2 x daily - 7 x weekly - 1-2 sets - 10 reps - Seated Quad Set (Mirrored)  - 3-5 x daily - 7 x weekly - 1 sets - 10 reps - 5 hold   ASSESSMENT:   CLINICAL IMPRESSION: Continued ambulation independently.  Able to tolerate standing WB activity better with focus today on functional strength for stair navigation improvements.  Eccentric lowering control lacking on normal step height but able to perform well on lower reduced height.  Continued skilled PT services indicated to continue to progress towards goals.    OBJECTIVE IMPAIRMENTS Abnormal gait, decreased activity tolerance, decreased balance, decreased knowledge of condition, decreased knowledge of use of DME, decreased mobility, difficulty walking, decreased ROM, decreased strength, increased edema, increased muscle spasms, impaired flexibility, postural dysfunction, and pain.    ACTIVITY LIMITATIONS carrying, lifting, bending, sitting, standing, squatting, sleeping, stairs, transfers, and locomotion level   PARTICIPATION LIMITATIONS: meal prep, cleaning, laundry, driving, community activity, and occupation   [place] and 1-2  comorbidities: see PMH above  are also affecting patient's functional outcome.    REHAB POTENTIAL: Good   CLINICAL DECISION MAKING: Stable/uncomplicated   EVALUATION COMPLEXITY: Low     GOALS: Goals reviewed with patient? Yes   SHORT TERM GOALS: Target date: 11/29/2021   Patient independent and verbalizes compliance with initial HEP Baseline: SEE OBJECTIVE DATA Goal status: MET - assessed 11/22/2021   2.  Patient reports 50% improvement in left knee pain. Baseline: SEE OBJECTIVE DATA Goal status: partially met - assessed 11/22/2021   3.  PROM left knee extension -3* to flexion 90* Baseline: SEE OBJECTIVE DATA Goal status: partially met - assessed 11/22/2021   LONG TERM GOALS: Target date: 12/27/2021   Patient will improve FOTO score to 61% Baseline: SEE OBJECTIVE DATA Goal status: on going  - assessed 11/22/2021   2.  Patient reports left knee pain </= 1/10 with standing & gait activities.  Baseline: SEE OBJECTIVE DATA Goal status: on going  - assessed 11/22/2021   3.  Left Knee PROM 0* extension to 100* flexion Baseline: SEE OBJECTIVE DATA Goal status: on going  - assessed 11/22/2021   4.  Left Knee AROM seated -1* extension to 95* flexion Baseline: SEE OBJECTIVE DATA Goal status: on going  - assessed 11/22/2021   5.  Patient ambulates >500' community distances including negotiating ramps, curbs & stairs without device independently. Baseline: SEE OBJECTIVE DATA Goal status: on going  - assessed 11/22/2021   PLAN: PT FREQUENCY: 2-3 x/wk   PT DURATION:  9 weeks   PLANNED INTERVENTIONS: Therapeutic exercises, Therapeutic activity, Neuromuscular re-education, Balance training, Gait training, Patient/Family education, Joint mobilization, Stair training, DME instructions, Electrical stimulation, Cryotherapy, Moist heat, Taping, Vasopneumatic device, and Manual therapy   PLAN FOR NEXT SESSION:  LTG assessment,  strength measurements.    Scot Jun, PT, DPT, OCS,  ATC 12/17/21  1:44 PM

## 2021-12-19 ENCOUNTER — Encounter: Payer: Self-pay | Admitting: Rehabilitative and Restorative Service Providers"

## 2021-12-19 ENCOUNTER — Ambulatory Visit: Payer: Medicare Other | Admitting: Rehabilitative and Restorative Service Providers"

## 2021-12-19 DIAGNOSIS — R2681 Unsteadiness on feet: Secondary | ICD-10-CM

## 2021-12-19 DIAGNOSIS — M25562 Pain in left knee: Secondary | ICD-10-CM

## 2021-12-19 DIAGNOSIS — R2689 Other abnormalities of gait and mobility: Secondary | ICD-10-CM

## 2021-12-19 DIAGNOSIS — M25662 Stiffness of left knee, not elsewhere classified: Secondary | ICD-10-CM

## 2021-12-19 DIAGNOSIS — M6281 Muscle weakness (generalized): Secondary | ICD-10-CM | POA: Diagnosis not present

## 2021-12-19 DIAGNOSIS — R600 Localized edema: Secondary | ICD-10-CM | POA: Diagnosis not present

## 2021-12-19 NOTE — Therapy (Signed)
OUTPATIENT PHYSICAL THERAPY TREATMENT NOTE   Patient Name: Tara Davis MRN: [id number] DOB:11-16-1945, 76 y.o., female Today's Date: 12/19/2021  PCP: Glendale Chard, MD REFERRING PROVIDER: Suzan Slick, NP   END OF SESSION:   PT End of Session - 12/19/21 1315     Visit Number 15    Number of Visits 20    Date for PT Re-Evaluation 12/30/21    Authorization Type BCBS Medicare    Authorization Time Period $10 COPAY  Deductible ($0.00)Deductible has been met  Out-of-Pocket Limit ($3,950.00) $455.62 Paid  $3,494.38 to go    Progress Note Due on Visit 17    PT Start Time 1300    PT Stop Time 1351    PT Time Calculation (min) 51 min    Activity Tolerance Patient tolerated treatment well    Behavior During Therapy WFL for tasks assessed/performed                  Past Medical History:  Diagnosis Date   Adenomatous colon polyp    Anxiety    Arthritis    Bilateral cataracts    within 6 mos.   Depression    Diverticulosis    Fatty liver    GERD (gastroesophageal reflux disease)    H. pylori infection    Hyperlipidemia    Ulcer 1988   Uterine fibroid    Past Surgical History:  Procedure Laterality Date   APPENDECTOMY     CATARACT EXTRACTION, BILATERAL     CHOLECYSTECTOMY     COLONOSCOPY     spontaneous vaginal delivery     x2   TONSILLECTOMY     TOTAL KNEE ARTHROPLASTY Left 10/11/2021   Procedure: LEFT TOTAL KNEE ARTHROPLASTY;  Surgeon: Newt Minion, MD;  Location: [facility];  Service: Orthopedics;  Laterality: Left;   WISDOM TOOTH EXTRACTION     Patient Active Problem List   Diagnosis Date Noted   Total knee replacement status, left 10/11/2021   Unilateral primary osteoarthritis, left knee    Personal history of colonic polyps 08/07/2020   Gastroesophageal reflux disease 08/23/2019   Tobacco abuse 01/12/2018   Cigarette smoker 01/12/2018   IBS 07/05/2008    REFERRING DIAG: M17.12 (ICD-10-CM) - Unilateral primary osteoarthritis, left knee  THERAPY  DIAG:  Stiffness of left knee, not elsewhere classified  Acute pain of left knee  Muscle weakness (generalized)  Localized edema  Unsteadiness on feet  Other abnormalities of gait and mobility  Rationale for Evaluation and Treatment Rehabilitation  PERTINENT HISTORY: OA, Left TKR 10/11/21, anxiety, depresssion, IBS diverticulosis  PRECAUTIONS: None  SUBJECTIVE:  Pt indicated feeling less motivated today overall.  Not specific to knee.  No new complaints reported from knee   PAIN:  NPRS scale:  2 /10 at worst in day  Pain location: left knee Pain description: stiffness, ache Aggravating factors: stiffness after inactivity, nighttime pains, after stairs Relieving factors: ice, heat   OBJECTIVE: (objective measures completed at initial evaluation unless otherwise dated)   DIAGNOSTIC FINDINGS: 10/24/2021: 2 view radiographs of the left knee shows a stable total knee arthroplasty  no complicating features.   PATIENT SURVEYS:  11/22/2021:  FOTO 55% 10/29/21: FOTO   43% and target 61%   COGNITION: 10/29/21:  Overall cognitive status: Within functional limits for tasks assessed                        POSTURE: 10/29/21:  weight shift right   EDEMA: 10/29/21:  RLE:  above knee 42.3cm around knee 40.3cm below knee  34.8cm 10/29/21:  LLE:  above knee 48cm around knee 44.3cm below knee 39.3cm   PALPATION: 10/29/21:  pt has ~6 areas on incision that appear to have internal sutures working out. Edema pocket and tenderness proximal incision. Tenderness along incision & joint line.     LOWER EXTREMITY ROM:   ROM P:passive A:active Right Eval 10/29/21 Left Eval 10/29/21 Left 11/11/21 Left 11/22/2021 Left 11/28/2021 Left 12/11/2021  Knee flexion   Seated P: 89* Supine  A: 65* Supine P: 94* Supine AROM heel slide: 102  Supine heel slide AROM 109  Supine AROM heel slide: 110 c pain end range noted   Knee extension   Seated A: -18* Supine P: -9*  Seated LAQ -15  Supine quad set  AROM -5 Seated LAQ : -12  Supine quad set: -4   Passive heel prop -3   (Blank rows = not tested)   LOWER EXTREMITY MMT:   MMT Left 10/29/21 Right 11/22/2021 Left 11/22/2021 Left 12/03/2021 Left 12/19/2021  Knee flexion 3-/5      Knee extension 3-/5 5/5 36.8, 35.8 lbs 4/5 19.2, 18.5 lbs 4/5 20, 19 lbs 4+/5  32.4, 31.8 lbs   (Blank rows = not tested)     GAIT: 11/15/2021:  independent ambulation in clinic today (reported no cane use at home)  10/29/21:  Distance walked: 100' Assistive device utilized: Single point cane Level of assistance: SBA verbal cues Comments: antalgic with decreased stance LLE, left knee flexed in stance & minimal knee flexion in swing.      TODAY'S TREATMENT: 12/19/2021 Therex UBE LE only lvl 1 full revolutions seat 10 10 mins Seated TKE stretch prop up 2.5 lbs 5 mins Gastroc runner stretch on incline board 15 sec x 3 Seated Lt LAQ machine eccentric lowering on Lt 3 x 10 5 lbs    TherActivity: Leg Press Lt leg 2  x 15 37 lbs Lateral step down WB on Lt 2 x 10 4 inch step Step up 6 inch step Lt leg x 15  Neuro re-ed Fitter rocker board fwd/back light touch bilateral occasional HHA c SBA from clinician 30x   Vaso: Lt knee x 10 min; mod pressure, 34 deg  12/17/2021 Therex UBE LE only lvl 1 full revolutions seat 10 10 mins Seated TKE stretch prop up 2.5 lbs 5 mins Gastroc runner stretch on incline board 15 sec x 3 Seated Lt LAQ eccentric lowering on Lt 3 x 10 5 lbs    TherActivity: Leg Press Lt leg  x 15, 37 lbs x 15 43 lbs Lateral step down WB on Lt 2 x 10 4 inch step Step up 6 inch step Lt leg 2 x 10  Vaso: Lt knee x 10 min; mod pressure, 34 deg  12/11/2021 Therex Recumbent bike 3/4 revolutions 9 mins seat 5 Leg Press Lt leg  x 15, 37 lbs x 15 43 lbs Seated TKE stretch prop up 2.5 lbs 5 mins Gastroc runner stretch at wall 15 sec x 3 Seated LAQ 6 lbs 2 x 15 Lt Seated quad set Lt 5 sec hold x 5, SLR Lt 2 x 10 c slow eccentric lowering  focus.  Sit to stand 19 inch table no UE assist x 10   Manual: Supine Lt knee flexion/distraction, IR mobilization c movement for Lt knee flexion mobility gains.   Vaso: Lt knee x 10 min; mod pressure, 34 deg   HOME EXERCISE PROGRAM: Access Code:  [facility] URL: [email or web address] Date: 11/22/2021 Prepared by: Scot Jun  Exercises - Supine Heel Slide with Strap  - 2-3 x daily - 7 x weekly - 2-3 sets - 10 reps - 5 seconds hold - Supine Straight Leg Raises  - 2-3 x daily - 7 x weekly - 2-3 sets - 10 reps - 5 seconds hold - Seated Knee Flexion Extension AROM   - 2-4 x daily - 7 x weekly - 2-3 sets - 10 reps - 5 seconds hold - Seated Long Arc Quad  - 2-3 x daily - 7 x weekly - 2 sets - 10 reps - 5 seconds hold - Seated Hamstring Stretch with Strap  - 2-4 x daily - 7 x weekly - 1 sets - 3 reps - 20-30 seconds hold - Seated Straight Leg Heel Taps  - 1-2 x daily - 7 x weekly - 1-2 sets - 10 reps - Seated Quad Set (Mirrored)  - 3-5 x daily - 7 x weekly - 1 sets - 10 reps - 5 hold   ASSESSMENT:   CLINICAL IMPRESSION: Strength improved compared to last assessment.  Continued progression of improvement in WB loading for stairs, squats to benefit Pt performance going forward.     OBJECTIVE IMPAIRMENTS Abnormal gait, decreased activity tolerance, decreased balance, decreased knowledge of condition, decreased knowledge of use of DME, decreased mobility, difficulty walking, decreased ROM, decreased strength, increased edema, increased muscle spasms, impaired flexibility, postural dysfunction, and pain.    ACTIVITY LIMITATIONS carrying, lifting, bending, sitting, standing, squatting, sleeping, stairs, transfers, and locomotion level   PARTICIPATION LIMITATIONS: meal prep, cleaning, laundry, driving, community activity, and occupation   [place] and 1-2 comorbidities: see PMH above  are also affecting patient's functional outcome.    REHAB POTENTIAL: Good    CLINICAL DECISION MAKING: Stable/uncomplicated   EVALUATION COMPLEXITY: Low     GOALS: Goals reviewed with patient? Yes   SHORT TERM GOALS: Target date: 11/29/2021   Patient independent and verbalizes compliance with initial HEP Baseline: SEE OBJECTIVE DATA Goal status: MET - assessed 11/22/2021   2.  Patient reports 50% improvement in left knee pain. Baseline: SEE OBJECTIVE DATA Goal status: partially met - assessed 11/22/2021   3.  PROM left knee extension -3* to flexion 90* Baseline: SEE OBJECTIVE DATA Goal status: partially met - assessed 11/22/2021   LONG TERM GOALS: Target date: 12/27/2021   Patient will improve FOTO score to 61% Baseline: SEE OBJECTIVE DATA Goal status: on going  - assessed 12/19/2021   2.  Patient reports left knee pain </= 1/10 with standing & gait activities.  Baseline: SEE OBJECTIVE DATA Goal status: on going  - assessed 12/19/2021   3.  Left Knee PROM 0* extension to 100* flexion Baseline: SEE OBJECTIVE DATA Goal status: on going  - assessed 12/19/2021   4.  Left Knee AROM seated -1* extension to 95* flexion Baseline: SEE OBJECTIVE DATA Goal status: on going  - assessed 12/19/2021   5.  Patient ambulates >500' community distances including negotiating ramps, curbs & stairs without device independently. Baseline: SEE OBJECTIVE DATA Goal status: on going  - assessed 12/19/2021   PLAN: PT FREQUENCY: 2-3 x/wk   PT DURATION:  9 weeks   PLANNED INTERVENTIONS: Therapeutic exercises, Therapeutic activity, Neuromuscular re-education, Balance training, Gait training, Patient/Family education, Joint mobilization, Stair training, DME instructions, Electrical stimulation, Cryotherapy, Moist heat, Taping, Vasopneumatic device, and Manual therapy   PLAN FOR NEXT SESSION: Progress note for MD visit.    Legrand Como  Joya Gaskins, PT, DPT, OCS, ATC 12/19/21  2:02 PM

## 2021-12-24 ENCOUNTER — Encounter: Payer: Self-pay | Admitting: Rehabilitative and Restorative Service Providers"

## 2021-12-24 ENCOUNTER — Ambulatory Visit (INDEPENDENT_AMBULATORY_CARE_PROVIDER_SITE_OTHER): Payer: Medicare Other | Admitting: Orthopedic Surgery

## 2021-12-24 ENCOUNTER — Ambulatory Visit (INDEPENDENT_AMBULATORY_CARE_PROVIDER_SITE_OTHER): Payer: Medicare Other | Admitting: Rehabilitative and Restorative Service Providers"

## 2021-12-24 DIAGNOSIS — M6281 Muscle weakness (generalized): Secondary | ICD-10-CM | POA: Diagnosis not present

## 2021-12-24 DIAGNOSIS — M25562 Pain in left knee: Secondary | ICD-10-CM | POA: Diagnosis not present

## 2021-12-24 DIAGNOSIS — Z96652 Presence of left artificial knee joint: Secondary | ICD-10-CM

## 2021-12-24 DIAGNOSIS — R600 Localized edema: Secondary | ICD-10-CM | POA: Diagnosis not present

## 2021-12-24 DIAGNOSIS — M25662 Stiffness of left knee, not elsewhere classified: Secondary | ICD-10-CM

## 2021-12-24 DIAGNOSIS — R2689 Other abnormalities of gait and mobility: Secondary | ICD-10-CM

## 2021-12-24 DIAGNOSIS — R2681 Unsteadiness on feet: Secondary | ICD-10-CM

## 2021-12-24 NOTE — Therapy (Signed)
OUTPATIENT PHYSICAL THERAPY TREATMENT NOTE /PROGRESS NOTE  Patient Name: Tara Davis MRN: [id number] DOB:Dec 16, 1945, 76 y.o., female Today's Date: 12/24/2021  PCP: Glendale Chard, MD REFERRING PROVIDER: Suzan Slick, NP  Progress Note Reporting Period 11/22/2021 to 12/24/2021  See note below for Objective Data and Assessment of Progress/Goals.    END OF SESSION:   PT End of Session - 12/24/21 1307     Visit Number 16    Number of Visits 20    Date for PT Re-Evaluation 12/30/21    Authorization Type BCBS Medicare    Authorization Time Period $10 COPAY  Deductible ($0.00)Deductible has been met  Out-of-Pocket Limit ($3,950.00) $455.62 Paid  $3,494.38 to go    Progress Note Due on Visit 26    PT Start Time 1300    PT Stop Time 1340    PT Time Calculation (min) 40 min    Activity Tolerance Patient tolerated treatment well    Behavior During Therapy WFL for tasks assessed/performed                   Past Medical History:  Diagnosis Date   Adenomatous colon polyp    Anxiety    Arthritis    Bilateral cataracts    within 6 mos.   Depression    Diverticulosis    Fatty liver    GERD (gastroesophageal reflux disease)    H. pylori infection    Hyperlipidemia    Ulcer 1988   Uterine fibroid    Past Surgical History:  Procedure Laterality Date   APPENDECTOMY     CATARACT EXTRACTION, BILATERAL     CHOLECYSTECTOMY     COLONOSCOPY     spontaneous vaginal delivery     x2   TONSILLECTOMY     TOTAL KNEE ARTHROPLASTY Left 10/11/2021   Procedure: LEFT TOTAL KNEE ARTHROPLASTY;  Surgeon: Newt Minion, MD;  Location: [facility];  Service: Orthopedics;  Laterality: Left;   WISDOM TOOTH EXTRACTION     Patient Active Problem List   Diagnosis Date Noted   Total knee replacement status, left 10/11/2021   Unilateral primary osteoarthritis, left knee    Personal history of colonic polyps 08/07/2020   Gastroesophageal reflux disease 08/23/2019   Tobacco abuse 01/12/2018    Cigarette smoker 01/12/2018   IBS 07/05/2008    REFERRING DIAG: M17.12 (ICD-10-CM) - Unilateral primary osteoarthritis, left knee  THERAPY DIAG:  Stiffness of left knee, not elsewhere classified  Acute pain of left knee  Muscle weakness (generalized)  Localized edema  Unsteadiness on feet  Other abnormalities of gait and mobility  Rationale for Evaluation and Treatment Rehabilitation  PERTINENT HISTORY: OA, Left TKR 10/11/21, anxiety, depresssion, IBS diverticulosis  PRECAUTIONS: None  SUBJECTIVE:  Pt indicated overall improvement back to normal at about 75%.   Most difficult thing during the day is the stairs.    PAIN:  NPRS scale:  no specific pain upon arrival, pain at worst 5/10.  Pain location: left knee Pain description: stiffness, ache Aggravating factors: stiffness, nighttime symptoms.  Relieving factors: ice, heat   OBJECTIVE: (objective measures completed at initial evaluation unless otherwise dated)   DIAGNOSTIC FINDINGS: 10/24/2021: 2 view radiographs of the left knee shows a stable total knee arthroplasty  no complicating features.   PATIENT SURVEYS:  12/24/2021:  FOTO update:   61%  11/22/2021:  FOTO 55% 10/29/21: FOTO   43% and target 61%   COGNITION: 10/29/21:  Overall cognitive status: Within functional limits for tasks assessed  POSTURE: 10/29/21:  weight shift right   EDEMA: 10/29/21:  RLE:  above knee 42.3cm around knee 40.3cm below knee  34.8cm 10/29/21:  LLE:  above knee 48cm around knee 44.3cm below knee 39.3cm   PALPATION: 10/29/21:  pt has ~6 areas on incision that appear to have internal sutures working out. Edema pocket and tenderness proximal incision. Tenderness along incision & joint line.     LOWER EXTREMITY ROM:   ROM P:passive A:active Right Eval 10/29/21 Left Eval 10/29/21 Left 11/11/21 Left 11/22/2021 Left 11/28/2021 Left 12/11/2021 Left 12/24/2021  Knee flexion   Seated P: 89* Supine  A: 65* Supine P:  94* Supine AROM heel slide: 102  Supine heel slide AROM 109  Supine AROM heel slide: 110 c pain end range noted  Supine AROM heel slide 114  Knee extension   Seated A: -18* Supine P: -9*  Seated LAQ -15  Supine quad set AROM -5 Seated LAQ : -12  Supine quad set: -4   Passive heel prop -3 Seated LAQ -5   (Blank rows = not tested)   LOWER EXTREMITY MMT:   MMT Left 10/29/21 Right 11/22/2021 Left 11/22/2021 Left 12/03/2021 Left 12/19/2021  Knee flexion 3-/5      Knee extension 3-/5 5/5 36.8, 35.8 lbs 4/5 19.2, 18.5 lbs 4/5 20, 19 lbs 4+/5  32.4, 31.8 lbs   (Blank rows = not tested)     GAIT: 12/24/2021:  independent  11/15/2021:  independent ambulation in clinic today (reported no cane use at home)  10/29/21:  Distance walked: 100' Assistive device utilized: Single point cane Level of assistance: SBA verbal cues Comments: antalgic with decreased stance LLE, left knee flexed in stance & minimal knee flexion in swing.      TODAY'S TREATMENT: 12/24/2021 Therex UBE LE only lvl 1 full revolutions seat 9 9 mins Seated TKE stretch prop up 3 mins Seated Lt LAQ machine eccentric lowering on Lt 3 x 10 5 lbs  Additional time spent in verbal review of HEP existing and cues for consistency.    TherActivity: Leg Press Lt leg 3 x 10 37 lbs c pause 2 seconds in end ranges Step up over and down WB on Lt x 10 c single hand rail assist.   Neuro re-ed SLS c vector reaching light touch focus contralateral leg x 8 WB on Lt   12/19/2021 Therex UBE LE only lvl 1 full revolutions seat 10 10 mins Seated TKE stretch prop up 2.5 lbs 5 mins Gastroc runner stretch on incline board 15 sec x 3 Seated Lt LAQ machine eccentric lowering on Lt 3 x 10 5 lbs    TherActivity: Leg Press Lt leg 2  x 15 37 lbs Lateral step down WB on Lt 2 x 10 4 inch step Step up 6 inch step Lt leg x 15  Neuro re-ed Fitter rocker board fwd/back light touch bilateral occasional HHA c SBA from clinician 30x    Vaso: Lt knee x 10 min; mod pressure, 34 deg  12/17/2021 Therex UBE LE only lvl 1 full revolutions seat 10 10 mins Seated TKE stretch prop up 2.5 lbs 5 mins Gastroc runner stretch on incline board 15 sec x 3 Seated Lt LAQ eccentric lowering on Lt 3 x 10 5 lbs    TherActivity: Leg Press Lt leg  x 15, 37 lbs x 15 43 lbs Lateral step down WB on Lt 2 x 10 4 inch step Step up 6 inch step Lt leg 2 x 10  Vaso: Lt knee x 10 min; mod pressure, 34 deg  HOME EXERCISE PROGRAM: Access Code: [email or web address] URL: [email or web address] Date: 11/22/2021 Prepared by: Scot Jun  Exercises - Supine Heel Slide with Strap  - 2-3 x daily - 7 x weekly - 2-3 sets - 10 reps - 5 seconds hold - Supine Straight Leg Raises  - 2-3 x daily - 7 x weekly - 2-3 sets - 10 reps - 5 seconds hold - Seated Knee Flexion Extension AROM   - 2-4 x daily - 7 x weekly - 2-3 sets - 10 reps - 5 seconds hold - Seated Long Arc Quad  - 2-3 x daily - 7 x weekly - 2 sets - 10 reps - 5 seconds hold - Seated Hamstring Stretch with Strap  - 2-4 x daily - 7 x weekly - 1 sets - 3 reps - 20-30 seconds hold - Seated Straight Leg Heel Taps  - 1-2 x daily - 7 x weekly - 1-2 sets - 10 reps - Seated Quad Set (Mirrored)  - 3-5 x daily - 7 x weekly - 1 sets - 10 reps - 5 hold   ASSESSMENT:   CLINICAL IMPRESSION: Pt has attended 16 visits overall during course of treatment, reporting 75% overall improvement and moderate pain at worst (mostly noted at nighttime).  See objective data for updated information showing current presentation and progress towards goals.  Pt has continued to show impairment related to end range mobility and strength that can impact functional activity, primarily noted in WB loaded stair navigation (going down) as reported and observed.  Continued improvements in strength and control to help improve functional activities.  Continued skilled PT services warranted c focus on building confidence in HEP use, stair  navigation improvements.    OBJECTIVE IMPAIRMENTS Abnormal gait, decreased activity tolerance, decreased balance, decreased knowledge of condition, decreased knowledge of use of DME, decreased mobility, difficulty walking, decreased ROM, decreased strength, increased edema, increased muscle spasms, impaired flexibility, postural dysfunction, and pain.    ACTIVITY LIMITATIONS carrying, lifting, bending, sitting, standing, squatting, sleeping, stairs, transfers, and locomotion level   PARTICIPATION LIMITATIONS: meal prep, cleaning, laundry, driving, community activity, and occupation   [place] and 1-2 comorbidities: see PMH above  are also affecting patient's functional outcome.    REHAB POTENTIAL: Good   CLINICAL DECISION MAKING: Stable/uncomplicated   EVALUATION COMPLEXITY: Low     GOALS: Goals reviewed with patient? Yes   SHORT TERM GOALS: Target date: 11/29/2021   Patient independent and verbalizes compliance with initial HEP Baseline: SEE OBJECTIVE DATA Goal status: MET - assessed 11/22/2021   2.  Patient reports 50% improvement in left knee pain. Baseline: SEE OBJECTIVE DATA Goal status: partially met - assessed 11/22/2021   3.  PROM left knee extension -3* to flexion 90* Baseline: SEE OBJECTIVE DATA Goal status: partially met - assessed 11/22/2021   LONG TERM GOALS: Target date: 12/27/2021   Patient will improve FOTO score to 61% Baseline: SEE OBJECTIVE DATA Goal status: MET 12/24/2021   2.  Patient reports left knee pain </= 1/10 with standing & gait activities.  Baseline: SEE OBJECTIVE DATA Goal status: on going  - assessed 12/24/2021   3.  Left Knee PROM 0* extension to 100* flexion Baseline: SEE OBJECTIVE DATA Goal status: Partially met - 12/24/2021   4.  Left Knee AROM seated -1* extension to 95* flexion Baseline: SEE OBJECTIVE DATA Goal status: Partially met - 12/24/2021   5.  Patient ambulates >500' community distances including  negotiating  ramps, curbs & stairs without device independently. Baseline: SEE OBJECTIVE DATA Goal status: Met - 12/24/2021   PLAN: PT FREQUENCY: 2-3 x/wk   PT DURATION:  9 weeks   PLANNED INTERVENTIONS: Therapeutic exercises, Therapeutic activity, Neuromuscular re-education, Balance training, Gait training, Patient/Family education, Joint mobilization, Stair training, DME instructions, Electrical stimulation, Cryotherapy, Moist heat, Taping, Vasopneumatic device, and Manual therapy   PLAN FOR NEXT SESSION: Follow up from MD visit. Progressive strengthening in WB for stairs, HEP confidence improvements.    Scot Jun, PT, DPT, OCS, ATC 12/24/21  1:44 PM

## 2021-12-26 ENCOUNTER — Encounter: Payer: Self-pay | Admitting: Rehabilitative and Restorative Service Providers"

## 2021-12-26 ENCOUNTER — Ambulatory Visit: Payer: Medicare Other | Admitting: Rehabilitative and Restorative Service Providers"

## 2021-12-26 DIAGNOSIS — R2681 Unsteadiness on feet: Secondary | ICD-10-CM

## 2021-12-26 DIAGNOSIS — M6281 Muscle weakness (generalized): Secondary | ICD-10-CM

## 2021-12-26 DIAGNOSIS — M25662 Stiffness of left knee, not elsewhere classified: Secondary | ICD-10-CM

## 2021-12-26 DIAGNOSIS — M25562 Pain in left knee: Secondary | ICD-10-CM | POA: Diagnosis not present

## 2021-12-26 DIAGNOSIS — R2689 Other abnormalities of gait and mobility: Secondary | ICD-10-CM

## 2021-12-26 DIAGNOSIS — R600 Localized edema: Secondary | ICD-10-CM | POA: Diagnosis not present

## 2021-12-26 NOTE — Therapy (Signed)
OUTPATIENT PHYSICAL THERAPY TREATMENT NOTE   Patient Name: Tara Davis MRN: [id number] DOB:[birth year], 76 y.o., female Today's Date: 12/26/2021  PCP: Glendale Chard, MD REFERRING PROVIDER: Suzan Slick, NP  END OF SESSION:   PT End of Session - 12/26/21 1324     Visit Number 17    Number of Visits 20    Date for PT Re-Evaluation 12/30/21    Authorization Type BCBS Medicare    Authorization Time Period $10 COPAY  Deductible ($0.00)Deductible has been met  Out-of-Pocket Limit ($3,950.00) $455.62 Paid  $3,494.38 to go    Progress Note Due on Visit 26    PT Start Time 1300    PT Stop Time 1354    PT Time Calculation (min) 54 min    Activity Tolerance Patient tolerated treatment well    Behavior During Therapy WFL for tasks assessed/performed                    Past Medical History:  Diagnosis Date   Adenomatous colon polyp    Anxiety    Arthritis    Bilateral cataracts    within 6 mos.   Depression    Diverticulosis    Fatty liver    GERD (gastroesophageal reflux disease)    H. pylori infection    Hyperlipidemia    Ulcer 1988   Uterine fibroid    Past Surgical History:  Procedure Laterality Date   APPENDECTOMY     CATARACT EXTRACTION, BILATERAL     CHOLECYSTECTOMY     COLONOSCOPY     spontaneous vaginal delivery     x2   TONSILLECTOMY     TOTAL KNEE ARTHROPLASTY Left 10/11/2021   Procedure: LEFT TOTAL KNEE ARTHROPLASTY;  Surgeon: Newt Minion, MD;  Location: [place];  Service: Orthopedics;  Laterality: Left;   WISDOM TOOTH EXTRACTION     Patient Active Problem List   Diagnosis Date Noted   Total knee replacement status, left 10/11/2021   Unilateral primary osteoarthritis, left knee    Personal history of colonic polyps 08/07/2020   Gastroesophageal reflux disease 08/23/2019   Tobacco abuse 01/12/2018   Cigarette smoker 01/12/2018   IBS 07/05/2008    REFERRING DIAG: M17.12 (ICD-10-CM) - Unilateral primary osteoarthritis, left knee  THERAPY  DIAG:  Stiffness of left knee, not elsewhere classified  Acute pain of left knee  Muscle weakness (generalized)  Localized edema  Unsteadiness on feet  Other abnormalities of gait and mobility  Rationale for Evaluation and Treatment Rehabilitation  PERTINENT HISTORY: OA, Left TKR 10/11/21, anxiety, depresssion, IBS diverticulosis  PRECAUTIONS: None  SUBJECTIVE: Pt indicated doing a lot of work yesterday and reported random onset of night time pain quick but otherwise     PAIN:  NPRS scale:  no pain upon arrival.  Pain location: left knee Pain description: stiffness, ache Aggravating factors: stiffness, nighttime symptoms.  Relieving factors: ice, heat   OBJECTIVE: (objective measures completed at initial evaluation unless otherwise dated)   DIAGNOSTIC FINDINGS: 10/24/2021: 2 view radiographs of the left knee shows a stable total knee arthroplasty  no complicating features.   PATIENT SURVEYS:  12/24/2021:  FOTO update:   61%  11/22/2021:  FOTO 55% 10/29/21: FOTO   43% and target 61%   COGNITION: 10/29/21:  Overall cognitive status: Within functional limits for tasks assessed                        POSTURE: 10/29/21:  weight shift right  EDEMA: 10/29/21:  RLE:  above knee 42.3cm around knee 40.3cm below knee  34.8cm 10/29/21:  LLE:  above knee 48cm around knee 44.3cm below knee 39.3cm   PALPATION: 10/29/21:  pt has ~6 areas on incision that appear to have internal sutures working out. Edema pocket and tenderness proximal incision. Tenderness along incision & joint line.     LOWER EXTREMITY ROM:   ROM P:passive A:active Right Eval 10/29/21 Left Eval 10/29/21 Left 11/11/21 Left 11/22/2021 Left 11/28/2021 Left 12/11/2021 Left 12/24/2021  Knee flexion   Seated P: 89* Supine  A: 65* Supine P: 94* Supine AROM heel slide: 102  Supine heel slide AROM 109  Supine AROM heel slide: 110 c pain end range noted  Supine AROM heel slide 114  Knee extension   Seated A:  -18* Supine P: -9*  Seated LAQ -15  Supine quad set AROM -5 Seated LAQ : -12  Supine quad set: -4   Passive heel prop -3 Seated LAQ -5   (Blank rows = not tested)   LOWER EXTREMITY MMT:   MMT Left 10/29/21 Right 11/22/2021 Left 11/22/2021 Left 12/03/2021 Left 12/19/2021  Knee flexion 3-/5      Knee extension 3-/5 5/5 36.8, 35.8 lbs 4/5 19.2, 18.5 lbs 4/5 20, 19 lbs 4+/5  32.4, 31.8 lbs   (Blank rows = not tested)     GAIT: 12/24/2021:  independent  11/15/2021:  independent ambulation in clinic today (reported no cane use at home)  10/29/21:  Distance walked: 100' Assistive device utilized: Single point cane Level of assistance: SBA verbal cues Comments: antalgic with decreased stance LLE, left knee flexed in stance & minimal knee flexion in swing.      TODAY'S TREATMENT: 12/26/2021 Therex UBE LE only lvl 1 full revolutions seat 9 9 mins Seated TKE stretch prop up in chair c 2.5 lbs 5 mins  Seated SLR Lt 2 x 15 Seated Lt LAQ machine eccentric lowering on Lt 3 x 10 6 lbs  Leg Press Double leg 68 lbs x 15 c 2 sec pause in end ranges, Lt leg 2 x 15 37 lbs c pause 2 seconds in end ranges  Neuro re-ed SLS c vector reaching light touch focus contralateral leg x 6 WB on Lt Tandem stance 1 min x 1 bilateral  Vasopneumatic device 10 mins Lt knee 34 deg medium compression  12/24/2021 Therex UBE LE only lvl 1 full revolutions seat 9 9 mins Seated TKE stretch prop up 3 mins Seated Lt LAQ machine eccentric lowering on Lt 3 x 10 5 lbs  Additional time spent in verbal review of HEP existing and cues for consistency.    TherActivity: Leg Press Lt leg 3 x 10 37 lbs c pause 2 seconds in end ranges Step up over and down WB on Lt x 10 c single hand rail assist.   Neuro re-ed SLS c vector reaching light touch focus contralateral leg x 8 WB on Lt   12/19/2021 Therex UBE LE only lvl 1 full revolutions seat 10 10 mins Seated TKE stretch prop up 2.5 lbs 5 mins Gastroc runner  stretch on incline board 15 sec x 3 Seated Lt LAQ machine eccentric lowering on Lt 3 x 10 5 lbs    TherActivity: Leg Press Lt leg 2  x 15 37 lbs Lateral step down WB on Lt 2 x 10 4 inch step Step up 6 inch step Lt leg x 15  Neuro re-ed Fitter rocker board fwd/back light touch bilateral occasional  HHA c SBA from clinician 30x   Vaso: Lt knee x 10 min; mod pressure, 34 deg   HOME EXERCISE PROGRAM: Access Code: [email or web address] URL: [email or web address] Date: 11/22/2021 Prepared by: Scot Jun  Exercises - Supine Heel Slide with Strap  - 2-3 x daily - 7 x weekly - 2-3 sets - 10 reps - 5 seconds hold - Supine Straight Leg Raises  - 2-3 x daily - 7 x weekly - 2-3 sets - 10 reps - 5 seconds hold - Seated Knee Flexion Extension AROM   - 2-4 x daily - 7 x weekly - 2-3 sets - 10 reps - 5 seconds hold - Seated Long Arc Quad  - 2-3 x daily - 7 x weekly - 2 sets - 10 reps - 5 seconds hold - Seated Hamstring Stretch with Strap  - 2-4 x daily - 7 x weekly - 1 sets - 3 reps - 20-30 seconds hold - Seated Straight Leg Heel Taps  - 1-2 x daily - 7 x weekly - 1-2 sets - 10 reps - Seated Quad Set (Mirrored)  - 3-5 x daily - 7 x weekly - 1 sets - 10 reps - 5 hold   ASSESSMENT:   CLINICAL IMPRESSION: Reviewed passive and active extension based training in clinic and for home use to help progress end range extension.  Pt may continue to benefit from progressive quad strength and movement coordination improvements as well.    OBJECTIVE IMPAIRMENTS Abnormal gait, decreased activity tolerance, decreased balance, decreased knowledge of condition, decreased knowledge of use of DME, decreased mobility, difficulty walking, decreased ROM, decreased strength, increased edema, increased muscle spasms, impaired flexibility, postural dysfunction, and pain.    ACTIVITY LIMITATIONS carrying, lifting, bending, sitting, standing, squatting, sleeping, stairs, transfers, and locomotion level   PARTICIPATION  LIMITATIONS: meal prep, cleaning, laundry, driving, community activity, and occupation   [place] and 1-2 comorbidities: see PMH above  are also affecting patient's functional outcome.    REHAB POTENTIAL: Good   CLINICAL DECISION MAKING: Stable/uncomplicated   EVALUATION COMPLEXITY: Low     GOALS: Goals reviewed with patient? Yes   SHORT TERM GOALS: Target date: 11/29/2021   Patient independent and verbalizes compliance with initial HEP Baseline: SEE OBJECTIVE DATA Goal status: MET - assessed 11/22/2021   2.  Patient reports 50% improvement in left knee pain. Baseline: SEE OBJECTIVE DATA Goal status: partially met - assessed 11/22/2021   3.  PROM left knee extension -3* to flexion 90* Baseline: SEE OBJECTIVE DATA Goal status: partially met - assessed 11/22/2021   LONG TERM GOALS: Target date: 12/27/2021   Patient will improve FOTO score to 61% Baseline: SEE OBJECTIVE DATA Goal status: MET 12/24/2021   2.  Patient reports left knee pain </= 1/10 with standing & gait activities.  Baseline: SEE OBJECTIVE DATA Goal status: on going  - assessed 12/24/2021   3.  Left Knee PROM 0* extension to 100* flexion Baseline: SEE OBJECTIVE DATA Goal status: Partially met - 12/24/2021   4.  Left Knee AROM seated -1* extension to 95* flexion Baseline: SEE OBJECTIVE DATA Goal status: Partially met - 12/24/2021   5.  Patient ambulates >500' community distances including negotiating ramps, curbs & stairs without device independently. Baseline: SEE OBJECTIVE DATA Goal status: Met - 12/24/2021   PLAN: PT FREQUENCY: 2-3 x/wk   PT DURATION:  9 weeks   PLANNED INTERVENTIONS: Therapeutic exercises, Therapeutic activity, Neuromuscular re-education, Balance training, Gait training, Patient/Family education, Joint mobilization, Stair training, DME instructions, Dealer  stimulation, Cryotherapy, Moist heat, Taping, Vasopneumatic device, and Manual therapy   PLAN FOR NEXT SESSION:  TKE improvements, balance improvements.    Scot Jun, PT, DPT, OCS, ATC 12/26/21  1:44 PM

## 2021-12-31 ENCOUNTER — Ambulatory Visit: Payer: Medicare Other | Admitting: Rehabilitative and Restorative Service Providers"

## 2021-12-31 ENCOUNTER — Encounter: Payer: Self-pay | Admitting: Rehabilitative and Restorative Service Providers"

## 2021-12-31 DIAGNOSIS — M25662 Stiffness of left knee, not elsewhere classified: Secondary | ICD-10-CM | POA: Diagnosis not present

## 2021-12-31 DIAGNOSIS — R600 Localized edema: Secondary | ICD-10-CM | POA: Diagnosis not present

## 2021-12-31 DIAGNOSIS — M25562 Pain in left knee: Secondary | ICD-10-CM

## 2021-12-31 DIAGNOSIS — R2681 Unsteadiness on feet: Secondary | ICD-10-CM

## 2021-12-31 DIAGNOSIS — M6281 Muscle weakness (generalized): Secondary | ICD-10-CM | POA: Diagnosis not present

## 2021-12-31 DIAGNOSIS — R2689 Other abnormalities of gait and mobility: Secondary | ICD-10-CM

## 2021-12-31 NOTE — Therapy (Signed)
OUTPATIENT PHYSICAL THERAPY TREATMENT NOTE / Recert  Patient Name: Tara Davis MRN: [id number] DOB:1945/10/30, 76 y.o., female Today's Date: 12/31/2021  PCP: Glendale Chard, MD REFERRING PROVIDER: Suzan Slick, NP  Progress Note Reporting Period 12/24/2021 to 12/31/2021  See note below for Objective Data and Assessment of Progress/Goals.      END OF SESSION:   PT End of Session - 12/31/21 1350     Visit Number 18    Number of Visits 29    Date for PT Re-Evaluation 02/25/22    Authorization Type BCBS Medicare    Authorization Time Period $10 COPAY  Deductible ($0.00)Deductible has been met  Out-of-Pocket Limit ($3,950.00) $455.62 Paid  $3,494.38 to go    Progress Note Due on Visit 28    PT Start Time 1345    PT Stop Time 1435    PT Time Calculation (min) 50 min    Activity Tolerance Patient tolerated treatment well    Behavior During Therapy WFL for tasks assessed/performed                     Past Medical History:  Diagnosis Date   Adenomatous colon polyp    Anxiety    Arthritis    Bilateral cataracts    within 6 mos.   Depression    Diverticulosis    Fatty liver    GERD (gastroesophageal reflux disease)    H. pylori infection    Hyperlipidemia    Ulcer 1988   Uterine fibroid    Past Surgical History:  Procedure Laterality Date   APPENDECTOMY     CATARACT EXTRACTION, BILATERAL     CHOLECYSTECTOMY     COLONOSCOPY     spontaneous vaginal delivery     x2   TONSILLECTOMY     TOTAL KNEE ARTHROPLASTY Left 10/11/2021   Procedure: LEFT TOTAL KNEE ARTHROPLASTY;  Surgeon: Newt Minion, MD;  Location: [place];  Service: Orthopedics;  Laterality: Left;   WISDOM TOOTH EXTRACTION     Patient Active Problem List   Diagnosis Date Noted   Total knee replacement status, left 10/11/2021   Unilateral primary osteoarthritis, left knee    Personal history of colonic polyps 08/07/2020   Gastroesophageal reflux disease 08/23/2019   Tobacco abuse 01/12/2018    Cigarette smoker 01/12/2018   IBS 07/05/2008    REFERRING DIAG: M17.12 (ICD-10-CM) - Unilateral primary osteoarthritis, left knee  THERAPY DIAG:  Stiffness of left knee, not elsewhere classified  Acute pain of left knee  Muscle weakness (generalized)  Localized edema  Unsteadiness on feet  Other abnormalities of gait and mobility  Rationale for Evaluation and Treatment Rehabilitation  PERTINENT HISTORY: OA, Left TKR 10/11/21, anxiety, depresssion, IBS diverticulosis  PRECAUTIONS: None  SUBJECTIVE: Pt indicated doing a lot of work yesterday and reported random onset of night time pain quick but otherwise     PAIN:  NPRS scale:  no pain upon arrival.  Pain location: left knee Pain description: stiffness, ache Aggravating factors: stiffness, nighttime symptoms.  Relieving factors: ice, heat   OBJECTIVE: (objective measures completed at initial evaluation unless otherwise dated)   DIAGNOSTIC FINDINGS: 10/24/2021: 2 view radiographs of the left knee shows a stable total knee arthroplasty  no complicating features.   PATIENT SURVEYS:  12/24/2021:  FOTO update:   61%  11/22/2021:  FOTO 55%  10/29/21: FOTO   43% and target 61%   COGNITION: 10/29/21:  Overall cognitive status: Within functional limits for tasks assessed  POSTURE: 10/29/21:  weight shift right   EDEMA: 10/29/21:  RLE:  above knee 42.3cm around knee 40.3cm below knee  34.8cm 10/29/21:  LLE:  above knee 48cm around knee 44.3cm below knee 39.3cm   PALPATION: 10/29/21:  pt has ~6 areas on incision that appear to have internal sutures working out. Edema pocket and tenderness proximal incision. Tenderness along incision & joint line.     LOWER EXTREMITY ROM:   ROM P:passive A:active Right Eval 10/29/21 Left Eval 10/29/21 Left 11/11/21 Left 11/22/2021 Left 11/28/2021 Left 12/11/2021 Left 12/24/2021  Knee flexion   Seated P: 89* Supine  A: 65* Supine P: 94* Supine AROM heel slide: 102   Supine heel slide AROM 109  Supine AROM heel slide: 110 c pain end range noted  Supine AROM heel slide 114  Knee extension   Seated A: -18* Supine P: -9*  Seated LAQ -15  Supine quad set AROM -5 Seated LAQ : -12  Supine quad set: -4   Passive heel prop -3 Seated LAQ -5   (Blank rows = not tested)   LOWER EXTREMITY MMT:   MMT Left 10/29/21 Right 11/22/2021 Left 11/22/2021 Left 12/03/2021 Left 12/19/2021 Left 12/31/2021  Knee flexion 3-/5       Knee extension 3-/5 5/5 36.8, 35.8 lbs 4/5 19.2, 18.5 lbs 4/5 20, 19 lbs 4+/5  32.4, 31.8 lbs 34 lbs, 33 lbs   (Blank rows = not tested)     GAIT: 12/24/2021:  independent  11/15/2021:  independent ambulation in clinic today (reported no cane use at home)  10/29/21:  Distance walked: 100' Assistive device utilized: Single point cane Level of assistance: SBA verbal cues Comments: antalgic with decreased stance LLE, left knee flexed in stance & minimal knee flexion in swing.      TODAY'S TREATMENT: 12/31/2021 Therex UBE LE only lvl 1 full revolutions seat 9 9 mins Seated SLR Lt x 15 Lt leg LAQ x 5 Supine heel prop 5 mins c vaso  Additional time spent in review of HEP.   TherActivity Sit to stand to sit x 10 holding 5 lbs 19 inch Step up on Lt leg 6 inch x 10 no UE assist Lateral step up 6 inch x 10  Lt leg 2 x 15 37 lbs c pause 2 seconds in end ranges  Vasopneumatic device 10 mins Lt knee 34 deg medium compression   12/26/2021 Therex UBE LE only lvl 1 full revolutions seat 9 9 mins Seated TKE stretch prop up in chair c 2.5 lbs 5 mins  Seated SLR Lt 2 x 15 Seated Lt LAQ machine eccentric lowering on Lt 3 x 10 6 lbs  Leg Press Double leg 68 lbs x 15 c 2 sec pause in end ranges, Lt leg 2 x 15 37 lbs c pause 2 seconds in end ranges  Neuro re-ed SLS c vector reaching light touch focus contralateral leg x 6 WB on Lt Tandem stance 1 min x 1 bilateral  Vasopneumatic device 10 mins Lt knee 34 deg medium  compression  12/24/2021 Therex UBE LE only lvl 1 full revolutions seat 9 9 mins Seated TKE stretch prop up 3 mins Seated Lt LAQ machine eccentric lowering on Lt 3 x 10 5 lbs  Additional time spent in verbal review of HEP existing and cues for consistency.    TherActivity: Leg Press Lt leg 3 x 10 37 lbs c pause 2 seconds in end ranges Step up over and down WB on Lt x 10 c  single hand rail assist.   Neuro re-ed SLS c vector reaching light touch focus contralateral leg x 8 WB on Lt   HOME EXERCISE PROGRAM: Access Code: [facility] URL: [email or web address] Date: 12/31/2021 Prepared by: Scot Jun  Exercises - Supine Heel Slide with Strap  - 2-3 x daily - 7 x weekly - 2-3 sets - 10 reps - 5 seconds hold - Seated Knee Flexion Extension AROM   - 2-4 x daily - 7 x weekly - 2-3 sets - 10 reps - 5 seconds hold - Seated Long Arc Quad (Mirrored)  - 2-3 x daily - 7 x weekly - 2 sets - 10 reps - 5 seconds hold - Seated Hamstring Stretch with Strap  - 2-4 x daily - 7 x weekly - 1 sets - 3 reps - 20-30 seconds hold - Seated Straight Leg Heel Taps  - 1-2 x daily - 7 x weekly - 1-2 sets - 10 reps - Seated Quad Set (Mirrored)  - 3-5 x daily - 7 x weekly - 1 sets - 10 reps - 5 hold - Sit to Stand  - 1-2 x daily - 7 x weekly - 1-2 sets - 10 reps - Step Up (Mirrored)  - 1 x daily - 7 x weekly - 3 sets - 10 reps   ASSESSMENT:   CLINICAL IMPRESSION: Pt has attended 18 visits overall during course of treatment.  See objective data for updated information showing current presentation.  Continued gains have been noted towards goals.  Despite gains, Pt does continue to present with Lt knee pain c strength, mobility and movement coordination deficits which can impact functional activity of daily living, specifically stair navigation, walking distances as well as nighttime sleeping.  Continued skilled PT services indicated c goals of improving to reach goals and transition to HEP when  appropriate.    OBJECTIVE IMPAIRMENTS Abnormal gait, decreased activity tolerance, decreased balance, decreased knowledge of condition, decreased knowledge of use of DME, decreased mobility, difficulty walking, decreased ROM, decreased strength, increased edema, increased muscle spasms, impaired flexibility, postural dysfunction, and pain.    ACTIVITY LIMITATIONS carrying, lifting, bending, sitting, standing, squatting, sleeping, stairs, transfers, and locomotion level   PARTICIPATION LIMITATIONS: meal prep, cleaning, laundry, driving, community activity, and occupation   [place] and 1-2 comorbidities: see PMH above  are also affecting patient's functional outcome.    REHAB POTENTIAL: Good   CLINICAL DECISION MAKING: Stable/uncomplicated   EVALUATION COMPLEXITY: Low     GOALS: Goals reviewed with patient? Yes   SHORT TERM GOALS: Target date: 11/29/2021   Patient independent and verbalizes compliance with initial HEP Baseline: SEE OBJECTIVE DATA Goal status: MET - assessed 11/22/2021   2.  Patient reports 50% improvement in left knee pain. Baseline: SEE OBJECTIVE DATA Goal status: partially met - assessed 11/22/2021   3.  PROM left knee extension -3* to flexion 90* Baseline: SEE OBJECTIVE DATA Goal status: partially met - assessed 11/22/2021   LONG TERM GOALS: Target date: 02/25/2022   Patient will improve FOTO score to 61% Baseline: SEE OBJECTIVE DATA Goal status: MET 12/24/2021   2.  Patient reports left knee pain </= 1/10 with standing & gait activities.  Baseline: SEE OBJECTIVE DATA Goal status: Revised - 12/31/2021   3.  Left Knee PROM 0* extension to 100* flexion Baseline: SEE OBJECTIVE DATA Goal status: partially MET - 12/31/2021   4.  Left Knee AROM seated 0* extension to 110* flexion Baseline: SEE OBJECTIVE DATA Goal status: Revised - 12/31/2021  5.  Patient ambulates >500' community distances including negotiating ramps, curbs & stairs without  device independently. Baseline: SEE OBJECTIVE DATA Goal status: Met - 12/24/2021  6.  Patient will demonstrate reciprocal gait pattern up/down stairs c one handrail assist for household navigation.  Goal status:  New - 12/31/2021   PLAN: PT FREQUENCY: 1-2x/week   PT DURATION:  6 weeks   PLANNED INTERVENTIONS: Therapeutic exercises, Therapeutic activity, Neuromuscular re-education, Balance training, Gait training, Patient/Family education, Joint mobilization, Stair training, DME instructions, Electrical stimulation, Cryotherapy, Moist heat, Taping, Vasopneumatic device, and Manual therapy   PLAN FOR NEXT SESSION: TKE improvements, balance improvements improvements.    Scot Jun, PT, DPT, OCS, ATC 12/31/21  2:27 PM

## 2022-01-02 ENCOUNTER — Encounter: Payer: Self-pay | Admitting: Rehabilitative and Restorative Service Providers"

## 2022-01-02 ENCOUNTER — Ambulatory Visit: Payer: Medicare Other | Admitting: Rehabilitative and Restorative Service Providers"

## 2022-01-02 DIAGNOSIS — M25662 Stiffness of left knee, not elsewhere classified: Secondary | ICD-10-CM | POA: Diagnosis not present

## 2022-01-02 DIAGNOSIS — R600 Localized edema: Secondary | ICD-10-CM | POA: Diagnosis not present

## 2022-01-02 DIAGNOSIS — M6281 Muscle weakness (generalized): Secondary | ICD-10-CM | POA: Diagnosis not present

## 2022-01-02 DIAGNOSIS — R2681 Unsteadiness on feet: Secondary | ICD-10-CM

## 2022-01-02 DIAGNOSIS — M25562 Pain in left knee: Secondary | ICD-10-CM

## 2022-01-02 DIAGNOSIS — R2689 Other abnormalities of gait and mobility: Secondary | ICD-10-CM

## 2022-01-02 NOTE — Therapy (Signed)
OUTPATIENT PHYSICAL THERAPY TREATMENT NOTE  Patient Name: Tara Davis MRN: [id number] DOB:28-Jun-1945, 76 y.o., female Today's Date: 01/02/2022  PCP: Glendale Chard, MD REFERRING PROVIDER: Suzan Slick, NP   END OF SESSION:   PT End of Session - 01/02/22 1344     Visit Number 19    Number of Visits 29    Date for PT Re-Evaluation 02/25/22    Authorization Type BCBS Medicare    Authorization Time Period $10 COPAY  Deductible ($0.00)Deductible has been met  Out-of-Pocket Limit ($3,950.00) $455.62 Paid  $3,494.38 to go    Progress Note Due on Visit 28    PT Start Time 1344    PT Stop Time 1435    PT Time Calculation (min) 51 min    Activity Tolerance Patient tolerated treatment well    Behavior During Therapy WFL for tasks assessed/performed              Past Medical History:  Diagnosis Date   Adenomatous colon polyp    Anxiety    Arthritis    Bilateral cataracts    within 6 mos.   Depression    Diverticulosis    Fatty liver    GERD (gastroesophageal reflux disease)    H. pylori infection    Hyperlipidemia    Ulcer 1988   Uterine fibroid    Past Surgical History:  Procedure Laterality Date   APPENDECTOMY     CATARACT EXTRACTION, BILATERAL     CHOLECYSTECTOMY     COLONOSCOPY     spontaneous vaginal delivery     x2   TONSILLECTOMY     TOTAL KNEE ARTHROPLASTY Left 10/11/2021   Procedure: LEFT TOTAL KNEE ARTHROPLASTY;  Surgeon: Newt Minion, MD;  Location: [facility];  Service: Orthopedics;  Laterality: Left;   WISDOM TOOTH EXTRACTION     Patient Active Problem List   Diagnosis Date Noted   Total knee replacement status, left 10/11/2021   Unilateral primary osteoarthritis, left knee    Personal history of colonic polyps 08/07/2020   Gastroesophageal reflux disease 08/23/2019   Tobacco abuse 01/12/2018   Cigarette smoker 01/12/2018   IBS 07/05/2008    REFERRING DIAG: M17.12 (ICD-10-CM) - Unilateral primary osteoarthritis, left knee  THERAPY DIAG:   Stiffness of left knee, not elsewhere classified  Acute pain of left knee  Muscle weakness (generalized)  Localized edema  Unsteadiness on feet  Other abnormalities of gait and mobility  Rationale for Evaluation and Treatment Rehabilitation  PERTINENT HISTORY: OA, Left TKR 10/11/21, anxiety, depresssion, IBS diverticulosis  PRECAUTIONS: None  SUBJECTIVE: Pt indicated she had to go to a funeral with walking on grass that gave her some trouble.  Pt indicated feeling more discomfort and sore after doing that activity.     PAIN:  NPRS scale:  4/10 Pain location: left knee Pain description: sore/achy Aggravating factors: after walking uneven surfaces earlier, nighttime.  Relieving factors: ice, heat   OBJECTIVE: (objective measures completed at initial evaluation unless otherwise dated)   DIAGNOSTIC FINDINGS: 10/24/2021: 2 view radiographs of the left knee shows a stable total knee arthroplasty  no complicating features.   PATIENT SURVEYS:  12/24/2021:  FOTO update:   61%  11/22/2021:  FOTO 55%  10/29/21: FOTO   43% and target 61%   COGNITION: 10/29/21:  Overall cognitive status: Within functional limits for tasks assessed                        POSTURE: 10/29/21:  weight shift right   EDEMA: 10/29/21:  RLE:  above knee 42.3cm around knee 40.3cm below knee  34.8cm 10/29/21:  LLE:  above knee 48cm around knee 44.3cm below knee 39.3cm   PALPATION: 10/29/21:  pt has ~6 areas on incision that appear to have internal sutures working out. Edema pocket and tenderness proximal incision. Tenderness along incision & joint line.     LOWER EXTREMITY ROM:   ROM P:passive A:active Right Eval 10/29/21 Left Eval 10/29/21 Left 11/11/21 Left 11/22/2021 Left 11/28/2021 Left 12/11/2021 Left 12/24/2021  Knee flexion   Seated P: 89* Supine  A: 65* Supine P: 94* Supine AROM heel slide: 102  Supine heel slide AROM 109  Supine AROM heel slide: 110 c pain end range noted  Supine AROM heel  slide 114  Knee extension   Seated A: -18* Supine P: -9*  Seated LAQ -15  Supine quad set AROM -5 Seated LAQ : -12  Supine quad set: -4   Passive heel prop -3 Seated LAQ -5   (Blank rows = not tested)   LOWER EXTREMITY MMT:   MMT Left 10/29/21 Right 11/22/2021 Left 11/22/2021 Left 12/03/2021 Left 12/19/2021 Left 12/31/2021  Knee flexion 3-/5       Knee extension 3-/5 5/5 36.8, 35.8 lbs 4/5 19.2, 18.5 lbs 4/5 20, 19 lbs 4+/5  32.4, 31.8 lbs 34 lbs, 33 lbs   (Blank rows = not tested)     GAIT: 12/24/2021:  independent  11/15/2021:  independent ambulation in clinic today (reported no cane use at home)  10/29/21:  Distance walked: 100' Assistive device utilized: Single point cane Level of assistance: SBA verbal cues Comments: antalgic with decreased stance LLE, left knee flexed in stance & minimal knee flexion in swing.      TODAY'S TREATMENT: 01/02/2022 Therex UBE LE only lvl 1 full revolutions seat 9 10 mins Lt leg LAQ 2 x 15 6 lbs  TherActivity Step up on Lt leg 6 inch x 15 no UE assist Lateral step up 6 inch x 15    Neuro Re-ed Tandem stance 1 min x 2 bilateral Tandem ambulation fwd/back on foam 8 ft x 6 each way in // bars  Vasopneumatic device 10 mins Lt knee 34 deg medium compression  12/31/2021 Therex UBE LE only lvl 1 full revolutions seat 9 9 mins Seated SLR Lt x 15 Lt leg LAQ x 5 Supine heel prop 5 mins c vaso  Additional time spent in review of HEP.   TherActivity Sit to stand to sit x 10 holding 5 lbs 19 inch Step up on Lt leg 6 inch x 10 no UE assist Lateral step up 6 inch x 10  Lt leg 2 x 15 37 lbs c pause 2 seconds in end ranges  Vasopneumatic device 10 mins Lt knee 34 deg medium compression   12/26/2021 Therex UBE LE only lvl 1 full revolutions seat 9 9 mins Seated TKE stretch prop up in chair c 2.5 lbs 5 mins  Seated SLR Lt 2 x 15 Seated Lt LAQ machine eccentric lowering on Lt 3 x 10 6 lbs  Leg Press Double leg 68 lbs x 15 c 2 sec  pause in end ranges, Lt leg 2 x 15 37 lbs c pause 2 seconds in end ranges  Neuro re-ed SLS c vector reaching light touch focus contralateral leg x 6 WB on Lt Tandem stance 1 min x 1 bilateral  Vasopneumatic device 10 mins Lt knee 34 deg medium compression  HOME  EXERCISE PROGRAM: Access Code: [email or web address] URL: [email or web address] Date: 12/31/2021 Prepared by: Scot Jun  Exercises - Supine Heel Slide with Strap  - 2-3 x daily - 7 x weekly - 2-3 sets - 10 reps - 5 seconds hold - Seated Knee Flexion Extension AROM   - 2-4 x daily - 7 x weekly - 2-3 sets - 10 reps - 5 seconds hold - Seated Long Arc Quad (Mirrored)  - 2-3 x daily - 7 x weekly - 2 sets - 10 reps - 5 seconds hold - Seated Hamstring Stretch with Strap  - 2-4 x daily - 7 x weekly - 1 sets - 3 reps - 20-30 seconds hold - Seated Straight Leg Heel Taps  - 1-2 x daily - 7 x weekly - 1-2 sets - 10 reps - Seated Quad Set (Mirrored)  - 3-5 x daily - 7 x weekly - 1 sets - 10 reps - 5 hold - Sit to Stand  - 1-2 x daily - 7 x weekly - 1-2 sets - 10 reps - Step Up (Mirrored)  - 1 x daily - 7 x weekly - 3 sets - 10 reps   ASSESSMENT:   CLINICAL IMPRESSION: Pt to benefit from progressive strengthening and balance improvements to continue to improve tolerance to functional standing, walking and stair activity.  Medical necessity for continued treatment indicated at this time.    OBJECTIVE IMPAIRMENTS Abnormal gait, decreased activity tolerance, decreased balance, decreased knowledge of condition, decreased knowledge of use of DME, decreased mobility, difficulty walking, decreased ROM, decreased strength, increased edema, increased muscle spasms, impaired flexibility, postural dysfunction, and pain.    ACTIVITY LIMITATIONS carrying, lifting, bending, sitting, standing, squatting, sleeping, stairs, transfers, and locomotion level   PARTICIPATION LIMITATIONS: meal prep, cleaning, laundry, driving, community activity, and  occupation   [place] and 1-2 comorbidities: see PMH above  are also affecting patient's functional outcome.    REHAB POTENTIAL: Good   CLINICAL DECISION MAKING: Stable/uncomplicated   EVALUATION COMPLEXITY: Low     GOALS: Goals reviewed with patient? Yes   SHORT TERM GOALS: Target date: 11/29/2021   Patient independent and verbalizes compliance with initial HEP Baseline: SEE OBJECTIVE DATA Goal status: MET - assessed 11/22/2021   2.  Patient reports 50% improvement in left knee pain. Baseline: SEE OBJECTIVE DATA Goal status: partially met - assessed 11/22/2021   3.  PROM left knee extension -3* to flexion 90* Baseline: SEE OBJECTIVE DATA Goal status: partially met - assessed 11/22/2021   LONG TERM GOALS: Target date: 02/25/2022   Patient will improve FOTO score to 61% Baseline: SEE OBJECTIVE DATA Goal status: MET 12/24/2021   2.  Patient reports left knee pain </= 1/10 with standing & gait activities.  Baseline: SEE OBJECTIVE DATA Goal status: Revised - 12/31/2021   3.  Left Knee PROM 0* extension to 100* flexion Baseline: SEE OBJECTIVE DATA Goal status: partially MET - 12/31/2021   4.  Left Knee AROM seated 0* extension to 110* flexion Baseline: SEE OBJECTIVE DATA Goal status: Revised - 12/31/2021   5.  Patient ambulates >500' community distances including negotiating ramps, curbs & stairs without device independently. Baseline: SEE OBJECTIVE DATA Goal status: Met - 12/24/2021  6.  Patient will demonstrate reciprocal gait pattern up/down stairs c one handrail assist for household navigation.  Goal status:  New - 12/31/2021   PLAN: PT FREQUENCY: 1-2x/week   PT DURATION:  6 weeks   PLANNED INTERVENTIONS: Therapeutic exercises, Therapeutic activity, Neuromuscular re-education, Balance training, Gait  training, Patient/Family education, Joint mobilization, Stair training, DME instructions, Electrical stimulation, Cryotherapy, Moist heat, Taping,  Vasopneumatic device, and Manual therapy   PLAN FOR NEXT SESSION: TKE improvements, WB strength, balance improvements improvements for stability.    Scot Jun, PT, DPT, OCS, ATC 01/02/22  2:46 PM

## 2022-01-07 ENCOUNTER — Encounter: Payer: Self-pay | Admitting: Orthopedic Surgery

## 2022-01-07 NOTE — Progress Notes (Signed)
Office Visit Note   Patient: Tara Davis           Date of Birth: [birth year]           MRN: [id number] Visit Date: 12/24/2021              Requested by: Glendale Chard, MD [address] [place],  [place] [address] PCP: Patient, No Pcp Per  Chief Complaint  Patient presents with   Left Knee - Routine Post Op    10/11/2021 left total knee replacement       HPI: Patient is a 76 year old woman who is 3 months status post left total knee arthroplasty.  She has 2 more visits with therapy scheduled.  Assessment & Plan: Visit Diagnoses:  1. Status post total left knee replacement     Plan: Patient continues to make good progress she is pleased with her results she will continue with her strengthening.  Follow-Up Instructions: Return if symptoms worsen or fail to improve.   Ortho Exam  Patient is alert, oriented, no adenopathy, well-dressed, normal affect, normal respiratory effort. Examination patient has good range of motion of the knee there is no effusion no cellulitis the incision is well-healed.  Imaging: No results found. No images are attached to the encounter.  Labs: Lab Results  Component Value Date   HGBA1C 6.2 (H) 10/03/2021   HGBA1C 5.9 (H) 04/13/2018   LABORGA Multiple bacterial morphotypes present, none 12/17/2014   LABORGA predominant. Suggest appropriate recollection if 12/17/2014   LABORGA clinically indicated. 12/17/2014     Lab Results  Component Value Date   ALBUMIN 4.4 10/03/2021   ALBUMIN 4.5 12/07/2018   ALBUMIN 4.6 04/13/2018    Lab Results  Component Value Date   MG 2.1 01/07/2008   Lab Results  Component Value Date   VD25OH 31.2 04/13/2018    No results found for: "PREALBUMIN"    Latest Ref Rng & Units 10/03/2021    3:51 PM 12/07/2018    9:22 AM 04/13/2018   12:52 PM  CBC EXTENDED  WBC 3.4 - 10.8 x10E3/uL 9.8  9.7  8.7   RBC 3.77 - 5.28 x10E6/uL 3.93  4.34  4.38   Hemoglobin 11.1 - 15.9 g/dL 13.7  14.5  14.6   HCT  34.0 - 46.6 % 38.2  41.9  42.2   Platelets 150 - 450 x10E3/uL 288  299  289   NEUT# 1.4 - 7.0 x10E3/uL  6.8    Lymph# 0.7 - 3.1 x10E3/uL  2.1       There is no height or weight on file to calculate BMI.  Orders:  No orders of the defined types were placed in this encounter.  No orders of the defined types were placed in this encounter.    Procedures: No procedures performed  Clinical Data: No additional findings.  ROS:  All other systems negative, except as noted in the HPI. Review of Systems  Objective: Vital Signs: There were no vitals taken for this visit.  Specialty Comments:  No specialty comments available.  PMFS History: Patient Active Problem List   Diagnosis Date Noted   Total knee replacement status, left 10/11/2021   Unilateral primary osteoarthritis, left knee    Personal history of colonic polyps 08/07/2020   Gastroesophageal reflux disease 08/23/2019   Tobacco abuse 01/12/2018   Cigarette smoker 01/12/2018   IBS 07/05/2008   Past Medical History:  Diagnosis Date   Adenomatous colon polyp    Anxiety  Arthritis    Bilateral cataracts    within 6 mos.   Depression    Diverticulosis    Fatty liver    GERD (gastroesophageal reflux disease)    H. pylori infection    Hyperlipidemia    Ulcer 1988   Uterine fibroid     Family History  Problem Relation Age of Onset   Alzheimer's disease Mother    Depression Father    Diabetes Father    Breast cancer Sister    Colon cancer Neg Hx    Esophageal cancer Neg Hx    Stomach cancer Neg Hx     Past Surgical History:  Procedure Laterality Date   APPENDECTOMY     CATARACT EXTRACTION, BILATERAL     CHOLECYSTECTOMY     COLONOSCOPY     spontaneous vaginal delivery     x2   TONSILLECTOMY     TOTAL KNEE ARTHROPLASTY Left 10/11/2021   Procedure: LEFT TOTAL KNEE ARTHROPLASTY;  Surgeon: Newt Minion, MD;  Location: [facility];  Service: Orthopedics;  Laterality: Left;   WISDOM TOOTH EXTRACTION      Social History   Occupational History   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

## 2022-01-10 ENCOUNTER — Ambulatory Visit: Payer: Medicare Other | Admitting: Rehabilitative and Restorative Service Providers"

## 2022-01-10 ENCOUNTER — Encounter: Payer: Self-pay | Admitting: Rehabilitative and Restorative Service Providers"

## 2022-01-10 DIAGNOSIS — M6281 Muscle weakness (generalized): Secondary | ICD-10-CM | POA: Diagnosis not present

## 2022-01-10 DIAGNOSIS — R2681 Unsteadiness on feet: Secondary | ICD-10-CM

## 2022-01-10 DIAGNOSIS — M25562 Pain in left knee: Secondary | ICD-10-CM | POA: Diagnosis not present

## 2022-01-10 DIAGNOSIS — R2689 Other abnormalities of gait and mobility: Secondary | ICD-10-CM

## 2022-01-10 DIAGNOSIS — M25662 Stiffness of left knee, not elsewhere classified: Secondary | ICD-10-CM | POA: Diagnosis not present

## 2022-01-10 DIAGNOSIS — R600 Localized edema: Secondary | ICD-10-CM | POA: Diagnosis not present

## 2022-01-10 NOTE — Therapy (Signed)
OUTPATIENT PHYSICAL THERAPY TREATMENT NOTE  Patient Name: Tara Davis MRN: [id number] DOB:[birth year], 76 y.o., female Today's Date: 01/10/2022  PCP: Glendale Chard, MD REFERRING PROVIDER: Suzan Slick, NP   END OF SESSION:   PT End of Session - 01/10/22 0936     Visit Number 20    Number of Visits 29    Date for PT Re-Evaluation 02/25/22    Authorization Type BCBS Medicare    Authorization Time Period $10 COPAY  Deductible ($0.00)Deductible has been met  Out-of-Pocket Limit ($3,950.00) $455.62 Paid  $3,494.38 to go    Progress Note Due on Visit 28    PT Start Time 0929    PT Stop Time 1020    PT Time Calculation (min) 51 min    Activity Tolerance Patient tolerated treatment well    Behavior During Therapy WFL for tasks assessed/performed               Past Medical History:  Diagnosis Date   Adenomatous colon polyp    Anxiety    Arthritis    Bilateral cataracts    within 6 mos.   Depression    Diverticulosis    Fatty liver    GERD (gastroesophageal reflux disease)    H. pylori infection    Hyperlipidemia    Ulcer 1988   Uterine fibroid    Past Surgical History:  Procedure Laterality Date   APPENDECTOMY     CATARACT EXTRACTION, BILATERAL     CHOLECYSTECTOMY     COLONOSCOPY     spontaneous vaginal delivery     x2   TONSILLECTOMY     TOTAL KNEE ARTHROPLASTY Left 10/11/2021   Procedure: LEFT TOTAL KNEE ARTHROPLASTY;  Surgeon: Newt Minion, MD;  Location: [facility];  Service: Orthopedics;  Laterality: Left;   WISDOM TOOTH EXTRACTION     Patient Active Problem List   Diagnosis Date Noted   Total knee replacement status, left 10/11/2021   Unilateral primary osteoarthritis, left knee    Personal history of colonic polyps 08/07/2020   Gastroesophageal reflux disease 08/23/2019   Tobacco abuse 01/12/2018   Cigarette smoker 01/12/2018   IBS 07/05/2008    REFERRING DIAG: M17.12 (ICD-10-CM) - Unilateral primary osteoarthritis, left knee  THERAPY DIAG:   Stiffness of left knee, not elsewhere classified  Acute pain of left knee  Muscle weakness (generalized)  Localized edema  Unsteadiness on feet  Other abnormalities of gait and mobility  Rationale for Evaluation and Treatment Rehabilitation  PERTINENT HISTORY: OA, Left TKR 10/11/21, anxiety, depresssion, IBS diverticulosis  PRECAUTIONS: None  SUBJECTIVE: Pt indicated nighttimes still can be "miserable".  Pt indicated feeling achy down the Lt leg.     PAIN:  NPRS scale:  upon arrival:  1/10 Pain location: left lateral leg back.  Pain description: sore/achy Aggravating factors: after walking uneven surfaces earlier, nighttime.  Relieving factors: ice, heat   OBJECTIVE: (objective measures completed at initial evaluation unless otherwise dated)   DIAGNOSTIC FINDINGS: 10/24/2021: 2 view radiographs of the left knee shows a stable total knee arthroplasty  no complicating features.   PATIENT SURVEYS:  12/24/2021:  FOTO update:   61%  11/22/2021:  FOTO 55%  10/29/21: FOTO   43% and target 61%   COGNITION: 10/29/21:  Overall cognitive status: Within functional limits for tasks assessed                        POSTURE: 10/29/21:  weight shift right   EDEMA:  10/29/21:  RLE:  above knee 42.3cm around knee 40.3cm below knee  34.8cm 10/29/21:  LLE:  above knee 48cm around knee 44.3cm below knee 39.3cm   PALPATION: 10/29/21:  pt has ~6 areas on incision that appear to have internal sutures working out. Edema pocket and tenderness proximal incision. Tenderness along incision & joint line.     LOWER EXTREMITY ROM:   ROM P:passive A:active Right Eval 10/29/21 Left Eval 10/29/21 Left 11/11/21 Left 11/22/2021 Left 11/28/2021 Left 12/11/2021 Left 12/24/2021 Left 01/10/2022  Knee flexion   Seated P: 89* Supine  A: 65* Supine P: 94* Supine AROM heel slide: 102  Supine heel slide AROM 109  Supine AROM heel slide: 110 c pain end range noted  Supine AROM heel slide 114 Supine AROM 115  heel slide  Knee extension   Seated A: -18* Supine P: -9*  Seated LAQ -15  Supine quad set AROM -5 Seated LAQ : -12  Supine quad set: -4   Passive heel prop -3 Seated LAQ -5 Seated LAQ -5 AROM  Passive heel prop overpressure -3   (Blank rows = not tested)   LOWER EXTREMITY MMT:   MMT Left 10/29/21 Right 11/22/2021 Left 11/22/2021 Left 12/03/2021 Left 12/19/2021 Left 12/31/2021  Knee flexion 3-/5       Knee extension 3-/5 5/5 36.8, 35.8 lbs 4/5 19.2, 18.5 lbs 4/5 20, 19 lbs 4+/5  32.4, 31.8 lbs 34 lbs, 33 lbs   (Blank rows = not tested)     GAIT: 12/24/2021:  independent  11/15/2021:  independent ambulation in clinic today (reported no cane use at home)  10/29/21:  Distance walked: 100' Assistive device utilized: Single point cane Level of assistance: SBA verbal cues Comments: antalgic with decreased stance LLE, left knee flexed in stance & minimal knee flexion in swing.      TODAY'S TREATMENT: 01/10/2022 Therex UBE LE only lvl 3 full revolutions seat 9 10 mins Lt leg LAQ 2 second end range hold  x 15 6 lbs, SLR c lateral/medial lift over weight 2 x 5, LAQ 2 sec end range hold x 15 6 lbs  Supine heel slide x 2 Seated heel prop TKE 5 mins 3 lbs   Manual STM c tool assisted percussive device to Lt lateral hip. Lateral thigh and lateral quad for trigger point relief.  End range screw home mechanism in extension g4 mobs in runner stretch at wall  Vasopneumatic device 10 mins Lt knee 34 deg medium compression  01/02/2022 Therex UBE LE only lvl 1 full revolutions seat 9 10 mins Lt leg LAQ  x 15 6 lbs  TherActivity Step up on Lt leg 6 inch x 15 no UE assist Lateral step up 6 inch x 15    Neuro Re-ed Tandem stance 1 min x 2 bilateral Tandem ambulation fwd/back on foam 8 ft x 6 each way in // bars  Vasopneumatic device 10 mins Lt knee 34 deg medium compression    HOME EXERCISE PROGRAM: Access Code: [email or web address] URL: [email or web address] Date:  12/31/2021 Prepared by: Scot Jun  Exercises - Supine Heel Slide with Strap  - 2-3 x daily - 7 x weekly - 2-3 sets - 10 reps - 5 seconds hold - Seated Knee Flexion Extension AROM   - 2-4 x daily - 7 x weekly - 2-3 sets - 10 reps - 5 seconds hold - Seated Long Arc Quad (Mirrored)  - 2-3 x daily - 7 x weekly - 2 sets - 10 reps - 5 seconds  hold - Seated Hamstring Stretch with Strap  - 2-4 x daily - 7 x weekly - 1 sets - 3 reps - 20-30 seconds hold - Seated Straight Leg Heel Taps  - 1-2 x daily - 7 x weekly - 1-2 sets - 10 reps - Seated Quad Set (Mirrored)  - 3-5 x daily - 7 x weekly - 1 sets - 10 reps - 5 hold - Sit to Stand  - 1-2 x daily - 7 x weekly - 1-2 sets - 10 reps - Step Up (Mirrored)  - 1 x daily - 7 x weekly - 3 sets - 10 reps   ASSESSMENT:   CLINICAL IMPRESSION: Lateral hip and lateral quad trigger points noted c possible connection to lateral thigh/leg complaints reported.  Discussion and education on use of rolling pin for home relief as desired.    Continued mild deficits in end range extension and in WB strength control that indicate continued skilled PT use.    OBJECTIVE IMPAIRMENTS Abnormal gait, decreased activity tolerance, decreased balance, decreased knowledge of condition, decreased knowledge of use of DME, decreased mobility, difficulty walking, decreased ROM, decreased strength, increased edema, increased muscle spasms, impaired flexibility, postural dysfunction, and pain.    ACTIVITY LIMITATIONS carrying, lifting, bending, sitting, standing, squatting, sleeping, stairs, transfers, and locomotion level   PARTICIPATION LIMITATIONS: meal prep, cleaning, laundry, driving, community activity, and occupation   [place] and 1-2 comorbidities: see PMH above  are also affecting patient's functional outcome.    REHAB POTENTIAL: Good   CLINICAL DECISION MAKING: Stable/uncomplicated   EVALUATION COMPLEXITY: Low     GOALS: Goals reviewed with patient?  Yes   SHORT TERM GOALS: Target date: 11/29/2021   Patient independent and verbalizes compliance with initial HEP Baseline: SEE OBJECTIVE DATA Goal status: MET - assessed 11/22/2021   2.  Patient reports 50% improvement in left knee pain. Baseline: SEE OBJECTIVE DATA Goal status: partially met - assessed 11/22/2021   3.  PROM left knee extension -3* to flexion 90* Baseline: SEE OBJECTIVE DATA Goal status: partially met - assessed 11/22/2021   LONG TERM GOALS: Target date: 02/25/2022   Patient will improve FOTO score to 61% Baseline: SEE OBJECTIVE DATA Goal status: MET 12/24/2021   2.  Patient reports left knee pain </= 1/10 with standing & gait activities.  Baseline: SEE OBJECTIVE DATA Goal status: Revised - 12/31/2021   3.  Left Knee PROM 0* extension to 100* flexion Baseline: SEE OBJECTIVE DATA Goal status: partially MET - 12/31/2021   4.  Left Knee AROM seated 0* extension to 110* flexion Baseline: SEE OBJECTIVE DATA Goal status: Revised - 12/31/2021   5.  Patient ambulates >500' community distances including negotiating ramps, curbs & stairs without device independently. Baseline: SEE OBJECTIVE DATA Goal status: Met - 12/24/2021  6.  Patient will demonstrate reciprocal gait pattern up/down stairs c one handrail assist for household navigation.  Goal status:  New - 12/31/2021   PLAN: PT FREQUENCY: 1-2x/week   PT DURATION:  6 weeks   PLANNED INTERVENTIONS: Therapeutic exercises, Therapeutic activity, Neuromuscular re-education, Balance training, Gait training, Patient/Family education, Joint mobilization, Stair training, DME instructions, Electrical stimulation, Cryotherapy, Moist heat, Taping, Vasopneumatic device, and Manual therapy   PLAN FOR NEXT SESSION: TKE improvements, WB strength.  Lateral hip myofascial release follow up   Scot Jun, PT, DPT, OCS, ATC 01/10/22  10:17 AM

## 2022-01-17 ENCOUNTER — Encounter: Payer: Self-pay | Admitting: Rehabilitative and Restorative Service Providers"

## 2022-01-17 ENCOUNTER — Ambulatory Visit: Payer: Medicare Other | Admitting: Rehabilitative and Restorative Service Providers"

## 2022-01-17 DIAGNOSIS — R2681 Unsteadiness on feet: Secondary | ICD-10-CM

## 2022-01-17 DIAGNOSIS — M6281 Muscle weakness (generalized): Secondary | ICD-10-CM

## 2022-01-17 DIAGNOSIS — M25662 Stiffness of left knee, not elsewhere classified: Secondary | ICD-10-CM

## 2022-01-17 DIAGNOSIS — R600 Localized edema: Secondary | ICD-10-CM | POA: Diagnosis not present

## 2022-01-17 DIAGNOSIS — M25562 Pain in left knee: Secondary | ICD-10-CM

## 2022-01-17 DIAGNOSIS — R2689 Other abnormalities of gait and mobility: Secondary | ICD-10-CM

## 2022-01-17 NOTE — Therapy (Signed)
OUTPATIENT PHYSICAL THERAPY TREATMENT NOTE  Patient Name: DANILYN COCKE MRN: [id number] DOB:April 12, 1946, 76 y.o., female Today's Date: 01/17/2022  PCP: Glendale Chard, MD REFERRING PROVIDER: Suzan Slick, NP   END OF SESSION:   PT End of Session - 01/17/22 1150     Visit Number 21    Number of Visits 29    Date for PT Re-Evaluation 02/25/22    Authorization Type BCBS Medicare    Authorization Time Period $10 COPAY  Deductible ($0.00)Deductible has been met  Out-of-Pocket Limit ($3,950.00) $455.62 Paid  $3,494.38 to go    Progress Note Due on Visit 28    PT Start Time 1143    PT Stop Time 1231    PT Time Calculation (min) 48 min    Activity Tolerance Patient tolerated treatment well    Behavior During Therapy WFL for tasks assessed/performed                Past Medical History:  Diagnosis Date   Adenomatous colon polyp    Anxiety    Arthritis    Bilateral cataracts    within 6 mos.   Depression    Diverticulosis    Fatty liver    GERD (gastroesophageal reflux disease)    H. pylori infection    Hyperlipidemia    Ulcer 1988   Uterine fibroid    Past Surgical History:  Procedure Laterality Date   APPENDECTOMY     CATARACT EXTRACTION, BILATERAL     CHOLECYSTECTOMY     COLONOSCOPY     spontaneous vaginal delivery     x2   TONSILLECTOMY     TOTAL KNEE ARTHROPLASTY Left 10/11/2021   Procedure: LEFT TOTAL KNEE ARTHROPLASTY;  Surgeon: Newt Minion, MD;  Location: [place];  Service: Orthopedics;  Laterality: Left;   WISDOM TOOTH EXTRACTION     Patient Active Problem List   Diagnosis Date Noted   Total knee replacement status, left 10/11/2021   Unilateral primary osteoarthritis, left knee    Personal history of colonic polyps 08/07/2020   Gastroesophageal reflux disease 08/23/2019   Tobacco abuse 01/12/2018   Cigarette smoker 01/12/2018   IBS 07/05/2008    REFERRING DIAG: M17.12 (ICD-10-CM) - Unilateral primary osteoarthritis, left knee  THERAPY DIAG:   Stiffness of left knee, not elsewhere classified  Acute pain of left knee  Muscle weakness (generalized)  Localized edema  Unsteadiness on feet  Other abnormalities of gait and mobility  Rationale for Evaluation and Treatment Rehabilitation  PERTINENT HISTORY: OA, Left TKR 10/11/21, anxiety, depresssion, IBS diverticulosis  PRECAUTIONS: None  SUBJECTIVE:  Pt indicated still having complaints of difficulty and pain doing the stairs over and over in house.  Pt indicated feeling like curb stepping has improved.   Reporting sleeping better.    PAIN:  NPRS scale:  pain at worst with step 4/10 Pain location: left lateral leg back.  Pain description: sore/achy Aggravating factors: after walking uneven surfaces earlier, nighttime.  Relieving factors: ice, heat   OBJECTIVE: (objective measures completed at initial evaluation unless otherwise dated)   DIAGNOSTIC FINDINGS: 10/24/2021: 2 view radiographs of the left knee shows a stable total knee arthroplasty  no complicating features.   PATIENT SURVEYS:  12/24/2021:  FOTO update:   61%  11/22/2021:  FOTO 55%  10/29/21: FOTO   43% and target 61%   COGNITION: 10/29/21:  Overall cognitive status: Within functional limits for tasks assessed  POSTURE: 10/29/21:  weight shift right   EDEMA: 10/29/21:  RLE:  above knee 42.3cm around knee 40.3cm below knee  34.8cm 10/29/21:  LLE:  above knee 48cm around knee 44.3cm below knee 39.3cm   PALPATION: 10/29/21:  pt has ~6 areas on incision that appear to have internal sutures working out. Edema pocket and tenderness proximal incision. Tenderness along incision & joint line.     LOWER EXTREMITY ROM:   ROM P:passive A:active Right Eval 10/29/21 Left Eval 10/29/21 Left 11/11/21 Left 11/22/2021 Left 11/28/2021 Left 12/11/2021 Left 12/24/2021 Left 01/10/2022  Knee flexion   Seated P: 89* Supine  A: 65* Supine P: 94* Supine AROM heel slide: 102  Supine heel slide AROM 109   Supine AROM heel slide: 110 c pain end range noted  Supine AROM heel slide 114 Supine AROM 115 heel slide  Knee extension   Seated A: -18* Supine P: -9*  Seated LAQ -15  Supine quad set AROM -5 Seated LAQ : -12  Supine quad set: -4   Passive heel prop -3 Seated LAQ -5 Seated LAQ -5 AROM  Passive heel prop overpressure -3   (Blank rows = not tested)   LOWER EXTREMITY MMT:   MMT Left 10/29/21 Right 11/22/2021 Left 11/22/2021 Left 12/03/2021 Left 12/19/2021 Left 12/31/2021  Knee flexion 3-/5       Knee extension 3-/5 5/5 36.8, 35.8 lbs 4/5 19.2, 18.5 lbs 4/5 20, 19 lbs 4+/5  32.4, 31.8 lbs 34 lbs, 33 lbs   (Blank rows = not tested)     Functional testing: 01/17/2022:  lateral step down on 6 inch step :  fair control c pain noted.   -Eccentric step down painful on normal step   GAIT:  12/24/2021:  independent  11/15/2021:  independent ambulation in clinic today (reported no cane use at home)  10/29/21:  Distance walked: 100' Assistive device utilized: Single point cane Level of assistance: SBA verbal cues Comments: antalgic with decreased stance LLE, left knee flexed in stance & minimal knee flexion in swing.      TODAY'S TREATMENT: 01/17/2022 Therex UBE LE only lvl 2 full revolutions seat 9 10 mins Gastroc stretch on incline board in runner stretch c posterior femoral glide sustained 30 x 3 Lt leg Machine extension Double leg up, Lt leg lowering 2 x 10 5 lbs    TherActivity Leg press Double leg 75 lbs pause in end ranges slow lowering focus x 20, Lt single 50 lbs 2 x 15 Lateral step down 6 inch x 10 c hand assist on rail, lateral step down 4 inch x10  Sit to stand 19 inch table height c slow lowering c cues for Rt leg out slightly to load Lt leg more.   Vasopneumatic device 10 mins Lt knee 34 deg medium compression  01/10/2022 Therex UBE LE only lvl 3 full revolutions seat 9 10 mins Lt leg LAQ 2 second end range hold  x 15 6 lbs, SLR c lateral/medial lift over  weight 2 x 5, LAQ 2 sec end range hold x 15 6 lbs  Supine heel slide x 2 Seated heel prop TKE 5 mins 3 lbs    Manual STM c tool assisted percussive device to Lt lateral hip. Lateral thigh and lateral quad for trigger point relief.  End range screw home mechanism in extension g4 mobs in runner stretch at wall  Vasopneumatic device 10 mins Lt knee 34 deg medium compression  01/02/2022 Therex UBE LE only lvl 1 full revolutions  seat 9 10 mins Lt leg LAQ  x 15 6 lbs  TherActivity Step up on Lt leg 6 inch x 15 no UE assist Lateral step up 6 inch x 15    Neuro Re-ed Tandem stance 1 min x 2 bilateral Tandem ambulation fwd/back on foam 8 ft x 6 each way in // bars  Vasopneumatic device 10 mins Lt knee 34 deg medium compression    HOME EXERCISE PROGRAM: Access Code: [email or web address] URL: [email or web address] Date: 12/31/2021 Prepared by: Scot Jun  Exercises - Supine Heel Slide with Strap  - 2-3 x daily - 7 x weekly - 2-3 sets - 10 reps - 5 seconds hold - Seated Knee Flexion Extension AROM   - 2-4 x daily - 7 x weekly - 2-3 sets - 10 reps - 5 seconds hold - Seated Long Arc Quad (Mirrored)  - 2-3 x daily - 7 x weekly - 2 sets - 10 reps - 5 seconds hold - Seated Hamstring Stretch with Strap  - 2-4 x daily - 7 x weekly - 1 sets - 3 reps - 20-30 seconds hold - Seated Straight Leg Heel Taps  - 1-2 x daily - 7 x weekly - 1-2 sets - 10 reps - Seated Quad Set (Mirrored)  - 3-5 x daily - 7 x weekly - 1 sets - 10 reps - 5 hold - Sit to Stand  - 1-2 x daily - 7 x weekly - 1-2 sets - 10 reps - Step Up (Mirrored)  - 1 x daily - 7 x weekly - 3 sets - 10 reps   ASSESSMENT:   CLINICAL IMPRESSION: Continued emphasis on progressive strengthening for Lt leg to improve stair navigation and reduce pain.  Continued skilled PT warranted to improve ability to navigate stairs at home.  Medical necessity indicated.    OBJECTIVE IMPAIRMENTS Abnormal gait, decreased activity tolerance,  decreased balance, decreased knowledge of condition, decreased knowledge of use of DME, decreased mobility, difficulty walking, decreased ROM, decreased strength, increased edema, increased muscle spasms, impaired flexibility, postural dysfunction, and pain.    ACTIVITY LIMITATIONS carrying, lifting, bending, sitting, standing, squatting, sleeping, stairs, transfers, and locomotion level   PARTICIPATION LIMITATIONS: meal prep, cleaning, laundry, driving, community activity, and occupation   [place] and 1-2 comorbidities: see PMH above  are also affecting patient's functional outcome.    REHAB POTENTIAL: Good   CLINICAL DECISION MAKING: Stable/uncomplicated   EVALUATION COMPLEXITY: Low     GOALS: Goals reviewed with patient? Yes   SHORT TERM GOALS: Target date: 11/29/2021   Patient independent and verbalizes compliance with initial HEP Baseline: SEE OBJECTIVE DATA Goal status: MET - assessed 11/22/2021   2.  Patient reports 50% improvement in left knee pain. Baseline: SEE OBJECTIVE DATA Goal status: partially met - assessed 11/22/2021   3.  PROM left knee extension -3* to flexion 90* Baseline: SEE OBJECTIVE DATA Goal status: partially met - assessed 11/22/2021   LONG TERM GOALS: Target date: 02/25/2022   Patient will improve FOTO score to 61% Baseline: SEE OBJECTIVE DATA Goal status: MET 12/24/2021   2.  Patient reports left knee pain </= 1/10 with standing & gait activities.  Baseline: SEE OBJECTIVE DATA Goal status: Revised - 12/31/2021   3.  Left Knee PROM 0* extension to 100* flexion Baseline: SEE OBJECTIVE DATA Goal status: partially MET - 12/31/2021   4.  Left Knee AROM seated 0* extension to 110* flexion Baseline: SEE OBJECTIVE DATA Goal status: Revised - 12/31/2021   5.  Patient ambulates >500' community distances including negotiating ramps, curbs & stairs without device independently. Baseline: SEE OBJECTIVE DATA Goal status: Met - 12/24/2021  6.   Patient will demonstrate reciprocal gait pattern up/down stairs c one handrail assist for household navigation.  Goal status:  New - 12/31/2021   PLAN: PT FREQUENCY: 1-2x/week   PT DURATION:  6 weeks   PLANNED INTERVENTIONS: Therapeutic exercises, Therapeutic activity, Neuromuscular re-education, Balance training, Gait training, Patient/Family education, Joint mobilization, Stair training, DME instructions, Electrical stimulation, Cryotherapy, Moist heat, Taping, Vasopneumatic device, and Manual therapy   PLAN FOR NEXT SESSION:   Continue strengthening for stair activity.  Recheck strength with dynamometer.    Scot Jun, PT, DPT, OCS, ATC 01/17/22  12:34 PM

## 2022-01-23 ENCOUNTER — Encounter: Payer: Medicare Other | Admitting: Rehabilitative and Restorative Service Providers"

## 2022-01-31 ENCOUNTER — Encounter: Payer: Self-pay | Admitting: Rehabilitative and Restorative Service Providers"

## 2022-01-31 ENCOUNTER — Ambulatory Visit: Payer: Medicare Other | Admitting: Rehabilitative and Restorative Service Providers"

## 2022-01-31 DIAGNOSIS — R2681 Unsteadiness on feet: Secondary | ICD-10-CM

## 2022-01-31 DIAGNOSIS — R600 Localized edema: Secondary | ICD-10-CM

## 2022-01-31 DIAGNOSIS — M25562 Pain in left knee: Secondary | ICD-10-CM

## 2022-01-31 DIAGNOSIS — M25662 Stiffness of left knee, not elsewhere classified: Secondary | ICD-10-CM | POA: Diagnosis not present

## 2022-01-31 DIAGNOSIS — Z1231 Encounter for screening mammogram for malignant neoplasm of breast: Secondary | ICD-10-CM | POA: Diagnosis not present

## 2022-01-31 DIAGNOSIS — M6281 Muscle weakness (generalized): Secondary | ICD-10-CM | POA: Diagnosis not present

## 2022-01-31 DIAGNOSIS — R2689 Other abnormalities of gait and mobility: Secondary | ICD-10-CM

## 2022-01-31 NOTE — Therapy (Addendum)
OUTPATIENT PHYSICAL THERAPY TREATMENT NOTE [place] Patient Name: Tara Davis MRN: [id number] DOB:1946/04/29, 76 y.o., female Today's Date: 01/31/2022  PCP: Glendale Chard, MD REFERRING PROVIDER: Suzan Slick, NP   END OF SESSION:   PT End of Session - 01/31/22 1054     Visit Number 22    Number of Visits 29    Date for PT Re-Evaluation 02/25/22    Authorization Type BCBS Medicare    Authorization Time Period $10 COPAY  Deductible ($0.00)Deductible has been met  Out-of-Pocket Limit ($3,950.00) $455.62 Paid  $3,494.38 to go    Progress Note Due on Visit 28    PT Start Time 1035    PT Stop Time 1126    PT Time Calculation (min) 51 min    Activity Tolerance Patient tolerated treatment well    Behavior During Therapy WFL for tasks assessed/performed                 Past Medical History:  Diagnosis Date   Adenomatous colon polyp    Anxiety    Arthritis    Bilateral cataracts    within 6 mos.   Depression    Diverticulosis    Fatty liver    GERD (gastroesophageal reflux disease)    H. pylori infection    Hyperlipidemia    Ulcer 1988   Uterine fibroid    Past Surgical History:  Procedure Laterality Date   APPENDECTOMY     CATARACT EXTRACTION, BILATERAL     CHOLECYSTECTOMY     COLONOSCOPY     spontaneous vaginal delivery     x2   TONSILLECTOMY     TOTAL KNEE ARTHROPLASTY Left 10/11/2021   Procedure: LEFT TOTAL KNEE ARTHROPLASTY;  Surgeon: Newt Minion, MD;  Location: [facility];  Service: Orthopedics;  Laterality: Left;   WISDOM TOOTH EXTRACTION     Patient Active Problem List   Diagnosis Date Noted   Total knee replacement status, left 10/11/2021   Unilateral primary osteoarthritis, left knee    Personal history of colonic polyps 08/07/2020   Gastroesophageal reflux disease 08/23/2019   Tobacco abuse 01/12/2018   Cigarette smoker 01/12/2018   IBS 07/05/2008    REFERRING DIAG: M17.12 (ICD-10-CM) - Unilateral primary osteoarthritis, left  knee  THERAPY DIAG:  Stiffness of left knee, not elsewhere classified  Acute pain of left knee  Muscle weakness (generalized)  Localized edema  Unsteadiness on feet  Other abnormalities of gait and mobility  Rationale for Evaluation and Treatment Rehabilitation  PERTINENT HISTORY: OA, Left TKR 10/11/21, anxiety, depresssion, IBS diverticulosis  PRECAUTIONS: None  SUBJECTIVE:  Pt indicated still having complaints of difficulty and pain doing the stairs over and over in house.  Pt indicated feeling like curb stepping has improved.   Reporting sleeping better.    PAIN:  NPRS scale:  pain at worst with step 4/10 Pain location: left lateral leg back.  Pain description: sore/achy Aggravating factors: after walking uneven surfaces earlier, nighttime.  Relieving factors: ice, heat   OBJECTIVE: (objective measures completed at initial evaluation unless otherwise dated)   DIAGNOSTIC FINDINGS: 10/24/2021: 2 view radiographs of the left knee shows a stable total knee arthroplasty  no complicating features.   PATIENT SURVEYS:  12/24/2021:  FOTO update:   61%  11/22/2021:  FOTO 55%  10/29/21: FOTO   43% and target 61%   COGNITION: 10/29/21:  Overall cognitive status: Within functional limits for tasks assessed  POSTURE: 10/29/21:  weight shift right   EDEMA: 10/29/21:  RLE:  above knee 42.3cm around knee 40.3cm below knee  34.8cm 10/29/21:  LLE:  above knee 48cm around knee 44.3cm below knee 39.3cm   PALPATION: 10/29/21:  pt has ~6 areas on incision that appear to have internal sutures working out. Edema pocket and tenderness proximal incision. Tenderness along incision & joint line.     LOWER EXTREMITY ROM:   ROM P:passive A:active Right Eval 10/29/21 Left Eval 10/29/21 Left 11/11/21 Left 11/22/2021 Left 11/28/2021 Left 12/11/2021 Left 12/24/2021 Left 01/10/2022  Knee flexion   Seated P: 89* Supine  A: 65* Supine P: 94* Supine AROM heel slide: 102  Supine  heel slide AROM 109  Supine AROM heel slide: 110 c pain end range noted  Supine AROM heel slide 114 Supine AROM 115 heel slide  Knee extension   Seated A: -18* Supine P: -9*  Seated LAQ -15  Supine quad set AROM -5 Seated LAQ : -12  Supine quad set: -4   Passive heel prop -3 Seated LAQ -5 Seated LAQ -5 AROM  Passive heel prop overpressure -3   (Blank rows = not tested)   LOWER EXTREMITY MMT:   MMT Left 10/29/21 Right 11/22/2021 Left 11/22/2021 Left 12/03/2021 Left 12/19/2021 Left 12/31/2021 Left 01/31/2022  Knee flexion 3-/5        Knee extension 3-/5 5/5 36.8, 35.8 lbs 4/5 19.2, 18.5 lbs 4/5 20, 19 lbs 4+/5  32.4, 31.8 lbs 34 lbs, 33 lbs 5/5 34, 34 lbs   (Blank rows = not tested)     Functional testing: 01/31/2022:  Step Lt UE good control no symptoms, no UE assist 6 inch step  Lateral step down 6 inch step fair to good control, no pain, no UE assist required.   01/17/2022:  lateral step down on 6 inch step :  fair control c pain noted.   -Eccentric step down painful on normal step   GAIT:  12/24/2021:  independent  11/15/2021:  independent ambulation in clinic today (reported no cane use at home)  10/29/21:  Distance walked: 100' Assistive device utilized: Single point cane Level of assistance: SBA verbal cues Comments: antalgic with decreased stance LLE, left knee flexed in stance & minimal knee flexion in swing.      TODAY'S TREATMENT: 01/31/2022 Therex UBE LE only lvl 2 full revolutions seat 9 12 mins Seated SLR bilateral 2 x 10 c cues for control movement.  Machine extension Double leg up, Lt leg lowering 2 x 10 5 lbs  Additional time spent in educational on continued use of HEP and importance of routine use for future gains.  Extended time spent in education on step up activity in various ways at home for continued strengthening program.    TherActivity Step up Lt leg first on 6 inch step 2 x 10  Lateral step down 6 inch x 15 c hand assist on  rail  Vasopneumatic device 10 mins Lt knee 34 deg medium compression  01/17/2022 Therex UBE LE only lvl 2 full revolutions seat 9 10 mins Gastroc stretch on incline board in runner stretch c posterior femoral glide sustained 30 x 3 Lt leg Machine extension Double leg up, Lt leg lowering 2 x 10 5 lbs    TherActivity Leg press Double leg 75 lbs pause in end ranges slow lowering focus x 20, Lt single 50 lbs 2 x 15 Lateral step down 6 inch x 10 c hand assist on rail, lateral step  down 4 inch x10  Sit to stand 19 inch table height c slow lowering c cues for Rt leg out slightly to load Lt leg more.   Vasopneumatic device 10 mins Lt knee 34 deg medium compression  01/10/2022 Therex UBE LE only lvl 3 full revolutions seat 9 10 mins Lt leg LAQ 2 second end range hold  x 15 6 lbs, SLR c lateral/medial lift over weight 2 x 5, LAQ 2 sec end range hold x 15 6 lbs  Supine heel slide x 2 Seated heel prop TKE 5 mins 3 lbs    Manual STM c tool assisted percussive device to Lt lateral hip. Lateral thigh and lateral quad for trigger point relief.  End range screw home mechanism in extension g4 mobs in runner stretch at wall  Vasopneumatic device 10 mins Lt knee 34 deg medium compression  01/02/2022 Therex UBE LE only lvl 1 full revolutions seat 9 10 mins Lt leg LAQ  x 15 6 lbs  TherActivity Step up on Lt leg 6 inch x 15 no UE assist Lateral step up 6 inch x 15    Neuro Re-ed Tandem stance 1 min x 2 bilateral Tandem ambulation fwd/back on foam 8 ft x 6 each way in // bars  Vasopneumatic device 10 mins Lt knee 34 deg medium compression    HOME EXERCISE PROGRAM: Access Code: [email or web address] URL: [email or web address] Date: 12/31/2021 Prepared by: Scot Jun  Exercises - Supine Heel Slide with Strap  - 2-3 x daily - 7 x weekly - 2-3 sets - 10 reps - 5 seconds hold - Seated Knee Flexion Extension AROM   - 2-4 x daily - 7 x weekly - 2-3 sets - 10 reps - 5 seconds hold -  Seated Long Arc Quad (Mirrored)  - 2-3 x daily - 7 x weekly - 2 sets - 10 reps - 5 seconds hold - Seated Hamstring Stretch with Strap  - 2-4 x daily - 7 x weekly - 1 sets - 3 reps - 20-30 seconds hold - Seated Straight Leg Heel Taps  - 1-2 x daily - 7 x weekly - 1-2 sets - 10 reps - Seated Quad Set (Mirrored)  - 3-5 x daily - 7 x weekly - 1 sets - 10 reps - 5 hold - Sit to Stand  - 1-2 x daily - 7 x weekly - 1-2 sets - 10 reps - Step Up (Mirrored)  - 1 x daily - 7 x weekly - 3 sets - 10 reps   ASSESSMENT:   CLINICAL IMPRESSION: After discussion c Pt today, decision was made to trial HEP at this time to continue to help improve functional movements such as stair climbing and general walking/standing.  At this time, good knowledge of HEP noted at this time.     OBJECTIVE IMPAIRMENTS Abnormal gait, decreased activity tolerance, decreased balance, decreased knowledge of condition, decreased knowledge of use of DME, decreased mobility, difficulty walking, decreased ROM, decreased strength, increased edema, increased muscle spasms, impaired flexibility, postural dysfunction, and pain.    ACTIVITY LIMITATIONS carrying, lifting, bending, sitting, standing, squatting, sleeping, stairs, transfers, and locomotion level   PARTICIPATION LIMITATIONS: meal prep, cleaning, laundry, driving, community activity, and occupation   [place] and 1-2 comorbidities: see PMH above  are also affecting patient's functional outcome.    REHAB POTENTIAL: Good   CLINICAL DECISION MAKING: Stable/uncomplicated   EVALUATION COMPLEXITY: Low     GOALS: Goals reviewed with patient? Yes   SHORT TERM GOALS:  Target date: 11/29/2021   Patient independent and verbalizes compliance with initial HEP Baseline: SEE OBJECTIVE DATA Goal status: MET - assessed 11/22/2021   2.  Patient reports 50% improvement in left knee pain. Baseline: SEE OBJECTIVE DATA Goal status: partially met - assessed 11/22/2021   3.   PROM left knee extension -3* to flexion 90* Baseline: SEE OBJECTIVE DATA Goal status: partially met - assessed 11/22/2021   LONG TERM GOALS: Target date: 02/25/2022   Patient will improve FOTO score to 61% Baseline: SEE OBJECTIVE DATA Goal status: MET 12/24/2021   2.  Patient reports left knee pain </= 1/10 with standing & gait activities.  Baseline: SEE OBJECTIVE DATA Goal status: on going 01/31/2022   3.  Left Knee PROM 0* extension to 100* flexion Baseline: SEE OBJECTIVE DATA Goal status: partially MET - 12/31/2021   4.  Left Knee AROM seated 0* extension to 110* flexion Baseline: SEE OBJECTIVE DATA Goal status: on going 01/31/2022   5.  Patient ambulates >500' community distances including negotiating ramps, curbs & stairs without device independently. Baseline: SEE OBJECTIVE DATA Goal status: Met - 12/24/2021  6.  Patient will demonstrate reciprocal gait pattern up/down stairs c one handrail assist for household navigation.  Goal status:  Partial MET 01/31/2022   PLAN: PT FREQUENCY: 1-2x/week   PT DURATION:  6 weeks   PLANNED INTERVENTIONS: Therapeutic exercises, Therapeutic activity, Neuromuscular re-education, Balance training, Gait training, Patient/Family education, Joint mobilization, Stair training, DME instructions, Electrical stimulation, Cryotherapy, Moist heat, Taping, Vasopneumatic device, and Manual therapy   PLAN FOR NEXT SESSION:   Trial HEP  Scot Jun, PT, DPT, OCS, ATC 01/31/22  11:48 AM   PHYSICAL THERAPY DISCHARGE SUMMARY  Visits from Start of Care: 22  Current functional level related to goals / functional outcomes: See note   Remaining deficits: See note   Education / Equipment: HEP  Patient goals were  mostly met . Patient is being discharged due to being pleased with the current functional level.  Scot Jun, PT, DPT, OCS, ATC 04/09/22  11:35 AM

## 2022-02-05 DIAGNOSIS — F331 Major depressive disorder, recurrent, moderate: Secondary | ICD-10-CM | POA: Diagnosis not present

## 2022-02-06 DIAGNOSIS — R928 Other abnormal and inconclusive findings on diagnostic imaging of breast: Secondary | ICD-10-CM | POA: Diagnosis not present

## 2022-02-06 DIAGNOSIS — N6321 Unspecified lump in the left breast, upper outer quadrant: Secondary | ICD-10-CM | POA: Diagnosis not present

## 2022-02-11 ENCOUNTER — Other Ambulatory Visit: Payer: Self-pay

## 2022-02-11 DIAGNOSIS — C50412 Malignant neoplasm of upper-outer quadrant of left female breast: Secondary | ICD-10-CM | POA: Diagnosis not present

## 2022-02-13 ENCOUNTER — Encounter: Payer: Self-pay | Admitting: Internal Medicine

## 2022-02-13 ENCOUNTER — Telehealth: Payer: Self-pay | Admitting: Hematology

## 2022-02-13 NOTE — Telephone Encounter (Signed)
Spoke to patient to confirm afternoon clinic appointment for 9/13

## 2022-02-17 ENCOUNTER — Encounter: Payer: Self-pay | Admitting: *Deleted

## 2022-02-17 DIAGNOSIS — C50412 Malignant neoplasm of upper-outer quadrant of left female breast: Secondary | ICD-10-CM | POA: Insufficient documentation

## 2022-02-17 DIAGNOSIS — Z170 Estrogen receptor positive status [ER+]: Secondary | ICD-10-CM | POA: Insufficient documentation

## 2022-02-17 NOTE — Progress Notes (Signed)
Radiation Oncology         [phone] ________________________________  Name: Tara Davis        MRN: [id number]  Date of Service: 02/19/2022 DOB: Jul 16, 1945  BE:[id number], No Pcp Per  Stark Klein, MD     REFERRING PHYSICIAN: Stark Klein, MD   DIAGNOSIS: There were no encounter diagnoses.   HISTORY OF PRESENT ILLNESS: Tara Davis is a 76 y.o. female seen in the multidisciplinary breast clinic for a new diagnosis of left breast cancer. The patient was noted to have a screening detected mass in the left breast, upper outer quadrant.  By ultrasound this measured 4 mm in greatest dimension in the 2 o'clock position correlating to the mammographic findings.  There is also focal asymmetry in the lower outer quadrant seen on mammogram that did not show any persistent finding.  Her left axilla was negative for adenopathy.  Biopsy on 02/11/2022 showed grade 2 invasive ductal carcinoma with associated intermediate grade DCIS.  Her cancer was ER/PR positive, HER2 negative with a Ki 67 of 20%.  She is seen today to discuss treatment recommendations of her cancer.Marland Kitchen    PREVIOUS RADIATION THERAPY: No   PAST MEDICAL HISTORY:  Past Medical History:  Diagnosis Date   Adenomatous colon polyp    Anxiety    Arthritis    Bilateral cataracts    within 6 mos.   Depression    Diverticulosis    Fatty liver    GERD (gastroesophageal reflux disease)    H. pylori infection    Hyperlipidemia    Ulcer 1988   Uterine fibroid        PAST SURGICAL HISTORY: Past Surgical History:  Procedure Laterality Date   APPENDECTOMY     CATARACT EXTRACTION, BILATERAL     CHOLECYSTECTOMY     COLONOSCOPY     spontaneous vaginal delivery     x2   TONSILLECTOMY     TOTAL KNEE ARTHROPLASTY Left 10/11/2021   Procedure: LEFT TOTAL KNEE ARTHROPLASTY;  Surgeon: Newt Minion, MD;  Location: [facility];  Service: Orthopedics;  Laterality: Left;   WISDOM TOOTH EXTRACTION       FAMILY HISTORY:  Family History   Problem Relation Age of Onset   Alzheimer's disease Mother    Depression Father    Diabetes Father    Breast cancer Sister    Colon cancer Neg Hx    Esophageal cancer Neg Hx    Stomach cancer Neg Hx      SOCIAL HISTORY:  reports that she has been smoking cigarettes. She has a 60.00 pack-year smoking history. She has never used smokeless tobacco. She reports current alcohol use of about 10.0 standard drinks of alcohol per week. She reports that she does not use drugs. The patient is single but in a relationship. 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 Her daughter in law Rosine Beat joins [place] by phone and she works in Brewing technologist.   ALLERGIES: Amoxicillin-pot clavulanate, Codeine, and Penicillins   MEDICATIONS:  Current Outpatient Medications  Medication Sig Dispense Refill   ARIPiprazole (ABILIFY) 2 MG tablet Take 4 mg by mouth daily.     atorvastatin (LIPITOR) 20 MG tablet TAKE ONE TABLET BY MOUTH DAILY 76 tablet 1   Bacillus Coagulans-Inulin (BENEFIBER PREBIOTIC+PROBIOTIC PO) Take 1 Scoop by mouth daily.     dicyclomine (BENTYL) 10 MG capsule Take 10 mg by mouth in the morning and at bedtime.  HYDROcodone-acetaminophen (NORCO/VICODIN) 5-325 MG tablet Take 1 tablet by mouth every 6 (six) hours as needed for moderate pain. 30 tablet 0   hyoscyamine (LEVSIN) 0.125 MG tablet Take 0.125 mg by mouth every 4 (four) hours as needed for cramping.     loperamide (IMODIUM A-D) 2 MG tablet Take 2 mg by mouth 4 (four) times daily as needed for diarrhea or loose stools.     LORazepam (ATIVAN) 0.5 MG tablet Take 1 mg by mouth 2 (two) times daily.      methocarbamol (ROBAXIN) 500 MG tablet Take 1 tablet (500 mg total) by mouth every 8 (eight) hours as needed for muscle spasms. 30 tablet 0   Na Sulfate-K Sulfate-Mg Sulf (SUPREP BOWEL PREP KIT) 17.5-3.13-1.6 GM/177ML SOLN Take 1 kit by mouth as directed. 324 mL 0   nicotine (NICODERM CQ -  DOSED IN MG/24 HOURS) 21 mg/24hr patch Place 21 mg onto the skin daily as needed (nicotine).     omeprazole (PRILOSEC) 40 MG capsule Take 40 mg by mouth 2 (two) times daily before a meal.     ondansetron (ZOFRAN) 4 MG tablet Take 4 mg by mouth every 8 (eight) hours as needed for nausea or vomiting.     oxyCODONE-acetaminophen (PERCOCET/ROXICET) 5-325 MG tablet Take 1 tablet by mouth every 4 (four) hours as needed. 30 tablet 0   vortioxetine HBr (TRINTELLIX) 20 MG TABS tablet Take 20 mg by mouth daily.     No current facility-administered medications for this visit.     REVIEW OF SYSTEMS: On review of systems, the patient reports that she is doing pretty well overall. She does not have any breast specific complaints.      PHYSICAL EXAM:  Wt Readings from Last 3 Encounters:  10/11/21 200 lb (90.7 kg)  10/04/21 205 lb 9.6 oz (93.3 kg)  10/03/21 204 lb (92.5 kg)   Temp Readings from Last 3 Encounters:  10/12/21 98.1 F (36.7 C) (Oral)  10/04/21 98.1 F (36.7 C) (Oral)  10/03/21 98.1 F (36.7 C) (Oral)   BP Readings from Last 3 Encounters:  10/12/21 118/63  10/04/21 (!) 152/74  10/03/21 128/62   Pulse Readings from Last 3 Encounters:  10/12/21 81  10/04/21 79  10/03/21 78    In general this is a well appearing caucasian female in no acute distress. She's alert and oriented x4 and appropriate throughout the examination. Cardiopulmonary assessment is negative for acute distress and she exhibits normal effort. Bilateral breast exam is deferred.    ECOG = 0  0 - Asymptomatic (Fully active, able to carry on all predisease activities without restriction)  1 - Symptomatic but completely ambulatory (Restricted in physically strenuous activity but ambulatory and able to carry out work of a light or sedentary nature. For example, light housework, office work)  2 - Symptomatic, <50% in bed during the day (Ambulatory and capable of all self care but unable to carry out any work  activities. Up and about more than 50% of waking hours)  3 - Symptomatic, >50% in bed, but not bedbound (Capable of only limited self-care, confined to bed or chair 50% or more of waking hours)  4 - Bedbound (Completely disabled. Cannot carry on any self-care. Totally confined to bed or chair)  5 - Death   Eustace Pen MM, Creech RH, Tormey DC, et al. [phone]). "Toxicity and response criteria of the [facility]". [facility] Oncol. 5 (6): 649-55    LABORATORY DATA:  Lab Results  Component  Value Date   WBC 9.8 10/03/2021   HGB 13.7 10/03/2021   HCT 38.2 10/03/2021   MCV 97 10/03/2021   PLT 288 10/03/2021   Lab Results  Component Value Date   NA 144 10/03/2021   K 4.1 10/03/2021   CL 106 10/03/2021   CO2 21 10/03/2021   Lab Results  Component Value Date   ALT 58 (H) 10/03/2021   AST 28 10/03/2021   ALKPHOS 124 (H) 10/03/2021   BILITOT <0.2 10/03/2021      RADIOGRAPHY: No results found.     IMPRESSION/PLAN: 1. Stage IA, [id number], grade 2 ER/PR positive, invasive ductal carcinoma of the left breast. Dr. Lisbeth Renshaw discusses the pathology findings and reviews the nature of left breast disease. The consensus from the breast conference includes breast conservation with lumpectomy. Depending on the size of the final tumor measurements rendered by pathology, the tumor may be tested for Oncotype Dx score to determine a role for systemic therapy. Provided that chemotherapy is not indicated, the patient's course would then be followed by external radiotherapy to the breast  to reduce risks of local recurrence followed by antiestrogen therapy. We discussed the risks, benefits, short, and long term effects of radiotherapy, as well as the curative intent. Dr. Lisbeth Renshaw also discusses cases in which radiation may be optional for favorable cases based on final pathology.  We will follow-up with her final results of surgery, but if recommended Dr. Lisbeth Renshaw would anticipate a course of 4  weeks of radiotherapy.  We will see her back a few weeks after surgery to discuss the simulation process and anticipate we starting radiotherapy about 4-6 weeks after surgery.  2. Possible genetic predisposition to malignancy. The patient is a candidate for genetic testing given her personal and family history. She will meet with our geneticist today in clinic.   In a visit lasting 60 minutes, greater than 50% of the time was spent face to face reviewing her case, as well as in preparation of, discussing, and coordinating the patient's care.  The above documentation reflects my direct findings during this shared patient visit. Please see the separate note by Dr. Lisbeth Renshaw on this date for the remainder of the patient's plan of care.    Carola Rhine, [facility]    **Disclaimer: This note was dictated with voice recognition software. Similar sounding words can inadvertently be transcribed and this note may contain transcription errors which may not have been corrected upon publication of note.**

## 2022-02-18 ENCOUNTER — Other Ambulatory Visit: Payer: Self-pay | Admitting: General Surgery

## 2022-02-18 ENCOUNTER — Encounter: Payer: Self-pay | Admitting: *Deleted

## 2022-02-18 DIAGNOSIS — C50412 Malignant neoplasm of upper-outer quadrant of left female breast: Secondary | ICD-10-CM

## 2022-02-18 DIAGNOSIS — Z170 Estrogen receptor positive status [ER+]: Secondary | ICD-10-CM

## 2022-02-19 ENCOUNTER — Ambulatory Visit: Payer: Medicare Other | Admitting: Physical Therapy

## 2022-02-19 ENCOUNTER — Encounter: Payer: Self-pay | Admitting: Hematology

## 2022-02-19 ENCOUNTER — Encounter: Payer: Self-pay | Admitting: Genetic Counselor

## 2022-02-19 ENCOUNTER — Inpatient Hospital Stay (HOSPITAL_BASED_OUTPATIENT_CLINIC_OR_DEPARTMENT_OTHER): Payer: Medicare Other | Admitting: Genetic Counselor

## 2022-02-19 ENCOUNTER — Ambulatory Visit
Admission: RE | Admit: 2022-02-19 | Discharge: 2022-02-19 | Disposition: A | Discharge status: Home / Self Care | Payer: Medicare Other | Source: Ambulatory Visit | Attending: Radiation Oncology | Admitting: Radiation Oncology

## 2022-02-19 ENCOUNTER — Inpatient Hospital Stay: Payer: Medicare Other

## 2022-02-19 ENCOUNTER — Encounter: Payer: Self-pay | Admitting: *Deleted

## 2022-02-19 ENCOUNTER — Inpatient Hospital Stay: Payer: Medicare Other | Attending: Hematology | Admitting: Hematology

## 2022-02-19 ENCOUNTER — Encounter: Payer: Self-pay | Admitting: Emergency Medicine

## 2022-02-19 VITALS — BP 166/67 | HR 93 | Temp 97.7°F | Resp 18 | Ht 63.0 in | Wt 195.4 lb

## 2022-02-19 DIAGNOSIS — C50412 Malignant neoplasm of upper-outer quadrant of left female breast: Secondary | ICD-10-CM

## 2022-02-19 DIAGNOSIS — F17210 Nicotine dependence, cigarettes, uncomplicated: Secondary | ICD-10-CM | POA: Diagnosis not present

## 2022-02-19 DIAGNOSIS — H269 Unspecified cataract: Secondary | ICD-10-CM | POA: Insufficient documentation

## 2022-02-19 DIAGNOSIS — Z170 Estrogen receptor positive status [ER+]: Secondary | ICD-10-CM | POA: Insufficient documentation

## 2022-02-19 DIAGNOSIS — F419 Anxiety disorder, unspecified: Secondary | ICD-10-CM | POA: Insufficient documentation

## 2022-02-19 DIAGNOSIS — K5790 Diverticulosis of intestine, part unspecified, without perforation or abscess without bleeding: Secondary | ICD-10-CM | POA: Insufficient documentation

## 2022-02-19 DIAGNOSIS — E785 Hyperlipidemia, unspecified: Secondary | ICD-10-CM | POA: Diagnosis not present

## 2022-02-19 DIAGNOSIS — Z79899 Other long term (current) drug therapy: Secondary | ICD-10-CM | POA: Diagnosis not present

## 2022-02-19 DIAGNOSIS — Z96659 Presence of unspecified artificial knee joint: Secondary | ICD-10-CM | POA: Diagnosis not present

## 2022-02-19 DIAGNOSIS — K760 Fatty (change of) liver, not elsewhere classified: Secondary | ICD-10-CM | POA: Diagnosis not present

## 2022-02-19 DIAGNOSIS — F32A Depression, unspecified: Secondary | ICD-10-CM | POA: Insufficient documentation

## 2022-02-19 DIAGNOSIS — K219 Gastro-esophageal reflux disease without esophagitis: Secondary | ICD-10-CM | POA: Diagnosis not present

## 2022-02-19 DIAGNOSIS — Z803 Family history of malignant neoplasm of breast: Secondary | ICD-10-CM

## 2022-02-19 DIAGNOSIS — M1990 Unspecified osteoarthritis, unspecified site: Secondary | ICD-10-CM | POA: Insufficient documentation

## 2022-02-19 LAB — CBC WITH DIFFERENTIAL (CANCER CENTER ONLY)
Abs Immature Granulocytes: 0.06 10*3/uL (ref 0.00–0.07)
Basophils Absolute: 0 10*3/uL (ref 0.0–0.1)
Basophils Relative: 0 %
Eosinophils Absolute: 0.1 10*3/uL (ref 0.0–0.5)
Eosinophils Relative: 1 %
HCT: 43 % (ref 36.0–46.0)
Hemoglobin: 14.7 g/dL (ref 12.0–15.0)
Immature Granulocytes: 1 %
Lymphocytes Relative: 28 %
Lymphs Abs: 2.7 10*3/uL (ref 0.7–4.0)
MCH: 32.7 pg (ref 26.0–34.0)
MCHC: 34.2 g/dL (ref 30.0–36.0)
MCV: 95.6 fL (ref 80.0–100.0)
Monocytes Absolute: 0.5 10*3/uL (ref 0.1–1.0)
Monocytes Relative: 5 %
Neutro Abs: 6.4 10*3/uL (ref 1.7–7.7)
Neutrophils Relative %: 65 %
Platelet Count: 300 10*3/uL (ref 150–400)
RBC: 4.5 MIL/uL (ref 3.87–5.11)
RDW: 13.8 % (ref 11.5–15.5)
WBC Count: 9.7 10*3/uL (ref 4.0–10.5)
nRBC: 0 % (ref 0.0–0.2)

## 2022-02-19 LAB — CMP (CANCER CENTER ONLY)
ALT: 25 U/L (ref 0–44)
AST: 21 U/L (ref 15–41)
Albumin: 4.6 g/dL (ref 3.5–5.0)
Alkaline Phosphatase: 82 U/L (ref 38–126)
Anion gap: 7 (ref 5–15)
BUN: 21 mg/dL (ref 8–23)
CO2: 27 mmol/L (ref 22–32)
Calcium: 9.9 mg/dL (ref 8.9–10.3)
Chloride: 105 mmol/L (ref 98–111)
Creatinine: 1.03 mg/dL — ABNORMAL HIGH (ref 0.44–1.00)
GFR, Estimated: 57 mL/min — ABNORMAL LOW (ref >60)
Glucose, Bld: 115 mg/dL — ABNORMAL HIGH (ref 70–99)
Potassium: 3.9 mmol/L (ref 3.5–5.1)
Sodium: 139 mmol/L (ref 135–145)
Total Bilirubin: 0.4 mg/dL (ref 0.3–1.2)
Total Protein: 7.3 g/dL (ref 6.5–8.1)

## 2022-02-19 NOTE — Research (Signed)
[employer] 2021-05 - Specimen Collection Study to Evaluate Biomarkers in Subjects with Cancer   INTRO STUDY/CONSENTS  Patient Tara Davis was identified by this Clinical Research Nurse as a potential candidate for the above listed study.  This Clinical Research Nurse met with [name], [id number], on 02/19/22 in a manner and location that ensures patient privacy to discuss participation in the above listed research study.  Patient is Unaccompanied.  A copy of the informed consent document and separate HIPAA Authorization was provided to the patient.  Patient reads, speaks, and understands [place].   Patient was provided with the business card of this Nurse and encouraged to contact the research team with any questions.  Approximately 20 minutes were spent with the patient reviewing the informed consent documents.  Patient was provided the option of taking informed consent documents home to review and was encouraged to review at their convenience with their support network, including other care providers. Patient took the consent documents home to review.  Will f/u with pt in the next few days to determine interest.  Wells Guiles 'Donell Sievert, RN, BSN Clinical Research Nurse I 02/19/22 4:13 PM

## 2022-02-19 NOTE — Progress Notes (Signed)
REFERRING PROVIDER: Truitt Merle, MD [place], [place] [address]  PRIMARY PROVIDER:  Patient, No Pcp Per  PRIMARY REASON FOR VISIT:  1. Malignant neoplasm of upper-outer quadrant of left breast in female, estrogen receptor positive ([place])   2. Family history of breast cancer     HISTORY OF PRESENT ILLNESS:   Tara Davis, a 76 y.o. female, was seen for a [facility] cancer genetics consultation at the request of Dr. Burr Medico due to a personal and family history of cancer.  Tara Davis presents to clinic today to discuss the possibility of a hereditary predisposition to cancer, to discuss genetic testing, and to further clarify her future cancer risks, as well as potential cancer risks for family members.   In September 2023, at the age of 52, Tara Davis was diagnosed with invasive ductal carcinoma of the left breast.  CANCER HISTORY:  Oncology History  Malignant neoplasm of upper-outer quadrant of left breast in female, estrogen receptor positive ([place])  02/11/2022 Initial Biopsy   Diagnosis Breast, left, needle core biopsy, 2 o'clock, 8cmfn - INVASIVE DUCTAL CARCINOMA, SEE NOTE - DUCTAL CARCINOMA IN SITU, INTERMEDIATE GRADE - TUBULE FORMATION: SCORE 3 - NUCLEAR PLEOMORPHISM: SCORE 2 - MITOTIC COUNT: SCORE 1 - TOTAL SCORE: 6 - OVERALL GRADE: 2 - LYMPHOVASCULAR INVASION: NOT IDENTIFIED - CANCER LENGTH: 0.3 CM - CALCIFICATIONS: NOT IDENTIFIED - OTHER FINDINGS: NONE   02/17/2022 Initial Diagnosis   Malignant neoplasm of upper-outer quadrant of left breast in female, estrogen receptor positive ([place])      RISK FACTORS:  Menarche was at age 8.  First live birth at age 69.  OCP use for approximately  22  years.  Ovaries intact: yes.  Uterus intact: yes.  Menopausal status: postmenopausal.  HRT use: 0 years. Colonoscopy: yes Mammogram within the last year: yes. Any excessive radiation exposure in the past: no  Past Medical History:  Diagnosis Date   Adenomatous  colon polyp    Anxiety    Arthritis    Bilateral cataracts    within 6 mos.   Depression    Diverticulosis    Fatty liver    GERD (gastroesophageal reflux disease)    H. pylori infection    Hyperlipidemia    Ulcer 1988   Uterine fibroid     Past Surgical History:  Procedure Laterality Date   APPENDECTOMY     CATARACT EXTRACTION, BILATERAL     CHOLECYSTECTOMY     COLONOSCOPY     spontaneous vaginal delivery     x2   TONSILLECTOMY     TOTAL KNEE ARTHROPLASTY Left 10/11/2021   Procedure: LEFT TOTAL KNEE ARTHROPLASTY;  Surgeon: Newt Minion, MD;  Location: [facility];  Service: Orthopedics;  Laterality: Left;   WISDOM TOOTH EXTRACTION      Social History   Socioeconomic History   Marital status: Single    Spouse name: Not on file   Number of children: 2   Years of education: 16   Highest education level: Bachelor's degree (e.g., BA, AB, BS)  Occupational History   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 She lives with her long time boyfriend of 25+ years ina 2 level home. She drinks green tea, 2-4 cups a day. She does not exercise.   Social Determinants of Health   Financial Resource Strain: Low Risk  (06/14/2020)   Overall Financial Resource Strain (CARDIA)    Difficulty of Paying Living Expenses: Not hard at all  Food Insecurity: No Food Insecurity (06/14/2020)   Hunger Vital Sign    Worried About Running Out of Food in the Last Year: Never true    Ran Out of Food in the Last Year:  Never true  Transportation Needs: No Transportation Needs (06/14/2020)   PRAPARE - Hydrologist (Medical): No    Lack of Transportation (Non-Medical): No  Physical Activity: Inactive (06/14/2020)   Exercise Vital Sign    Days of Exercise per Week: 0 days    Minutes of Exercise per Session: 0 min  Stress: No Stress Concern Present (06/14/2020)   [place]    Feeling of Stress : Not at all  Social Connections: Not on file     FAMILY HISTORY:  We obtained a detailed, 4-generation family history.  Significant diagnoses are listed below: Family History  Problem Relation Age of Onset   Alzheimer's disease Mother    Depression Father    Diabetes Father    Breast cancer Sister 64   Colon cancer Neg Hx    Esophageal cancer Neg Hx    Stomach cancer Neg Hx      Tara Davis's sister was diagnosed with breast cancer at age 7 and died due to metastatic breast cancer at age 55. Tara Davis is unaware of previous family history of genetic testing for hereditary cancer risks. There is no reported Ashkenazi Jewish ancestry.   GENETIC COUNSELING ASSESSMENT: Tara Davis is a 76 y.o. female with a personal and family history of cancer which is somewhat suggestive of a hereditary predisposition to cancer. We, therefore, discussed and recommended the following at today's visit.   DISCUSSION: We discussed that 5 - 10% of cancer is hereditary, with most cases of breast cancer associated with BRCA1/2.  There are other genes that can be associated with hereditary breast cancer syndromes.  We discussed that testing is beneficial for several reasons including knowing how to follow individuals after completing their treatment, identifying whether potential treatment options would be beneficial, and understanding if other family members could be at risk for cancer and allowing them to undergo genetic testing.   We reviewed the  characteristics, features and inheritance patterns of hereditary cancer syndromes. We also discussed genetic testing, including the appropriate family members to test, the process of testing, insurance coverage and turn-around-time for results. We discussed the implications of a negative, positive, carrier and/or variant of uncertain significant result. We recommended Tara Davis pursue genetic testing for a panel that includes genes associated with breast cancer.   Tara Davis elected to have [facility] Panel. The CancerNext gene panel offered by [employer] includes sequencing, rearrangement analysis, and RNA analysis for the following 36 genes:   APC, ATM, AXIN2, BARD1, BMPR1A, BRCA1, BRCA2, BRIP1, CDH1, CDK4, CDKN2A, CHEK2, DICER1, HOXB13, EPCAM, GREM1, MLH1, MSH2, MSH3, MSH6, MUTYH, NBN, NF1, NTHL1, PALB2,  PMS2, POLD1, POLE, PTEN, RAD51C, RAD51D, RECQL, SMAD4, SMARCA4, STK11, and TP53.   Based on Tara Davis's personal and family history of cancer, she does not meet medical criteria for genetic testing. She may have an out of pocket cost. We discussed that if her out of pocket cost for testing is over $100, the laboratory will call and confirm whether she wants to proceed with testing.  If the out of pocket cost of testing is less than $100 she will be billed by the genetic testing laboratory.   PLAN: After considering the risks, benefits, and limitations, Tara Davis provided informed consent to pursue genetic testing and the blood sample was sent to [facility] for analysis of the CancerNext Panel. Results should be available within approximately 2-3 weeks' time, at which point they will be disclosed by telephone to Tara Davis, as will any additional recommendations warranted by these results. Tara Davis will receive a summary of her genetic counseling visit and a copy of her results once available. This information will also be available in Epic.   Tara Davis's questions were answered to  her satisfaction today. Our contact information was provided should additional questions or concerns arise. Thank you for the referral and allowing [place] to share in the care of your patient.   Lucille Passy, MS, [facility] Genetic Counselor [email or web address] (P) [phone]  The patient was seen for a total of 20 minutes in face-to-face genetic counseling.  The patient was seen alone.  Drs. Lindi Adie and/or Burr Medico were available to discuss this case as needed.   _______________________________________________________________________ For Office Staff:  Number of people involved in session: 1 Was an Intern/ student involved with case: no

## 2022-02-19 NOTE — Progress Notes (Signed)
[place] Psychosocial Distress Screening Spiritual Care  Met with Tara Davis in [facility] to introduce [facility] team/resources, reviewing distress screen per protocol.  The patient scored a 5 on the Psychosocial Distress Thermometer which indicates moderate distress. Also assessed for distress and other psychosocial needs.      02/19/2022    4:03 PM  ONCBCN DISTRESS SCREENING  Distress experienced in past week (1-10) 5  Emotional problem type Depression;Nervousness/Anxiety;Adjusting to illness  Information Concerns Type Lack of info about complementary therapy choices  Physical Problem type Sleep/insomnia;Getting around;Swollen arms/legs  Referral to support programs Yes   Patient reported they have past experience with anxiety and depression. Patient reported she experienced anxiety this week but the anxiety has dissipated after meeting the doctors and team today.   Patient's sister passed away when her sister was in her 32's. The patient reported that after her sister's death she "gathered sisters" and has a tight knit group of girlfriends.  The patient reported having a good support systems and has the counselor's card if needs arise.   Follow up needed: No.  _0 @

## 2022-02-19 NOTE — Progress Notes (Signed)
[place]   [phone] Fax:([phone]) [place] Note   Patient Care Team: Patient, No Pcp Per as PCP - General (General Practice) Pieter Partridge, DO as Consulting Physician (Neurology) Rockwell Germany, RN as Oncology Nurse Navigator Mauro Kaufmann, RN as Oncology Nurse Navigator Stark Klein, MD as Consulting Physician (General Surgery) Truitt Merle, MD as Consulting Physician (Hematology) Kyung Rudd, MD as Consulting Physician (Radiation Oncology) Percell Belt, MD as Consulting Physician (Gastroenterology)  Date of Service:  02/19/2022   CHIEF COMPLAINTS/PURPOSE OF CONSULTATION:  Left Breast Cancer, ER+  REFERRING PHYSICIAN:  [facility]   ASSESSMENT & PLAN:  Tara Davis is a 76 y.o. postmenopausal female with  1. Malignant neoplasm of upper-outer quadrant of left breast, Stage IA, c([id number]), ER+/PR+/HER2-, Grade 2 -found on screening mammogram. Left MM and [place] 02/06/22 showed 0.5 cm mass at 2 o'clock. Biopsy on 02/19/22 showed IDC and DCIS, grade 2. --We discussed her imaging findings and the biopsy results in great details. -Given the early stage disease, she likely need a lumpectomy. She is agreeable with that. She was seen by Dr. Barry Dienes today and likely will proceed with surgery soon.  -if her tumor is larger than 10 mm, I recommend a Oncotype Dx test on the surgical sample and we'll make a decision about adjuvant chemotherapy based on the Oncotype result. Written material of this test was given to her. She is 76 yo, not a great candidate for chemotherapy, but she would like to her risk of recurrence.  -Giving the strong ER and PR expression in her postmenopausal status, I recommend adjuvant endocrine therapy with aromatase inhibitor for a total of 5-10 years to reduce the risk of cancer recurrence. Potential benefits and side effects were discussed with patient and she is interested. -She was also seen by radiation oncologist Dr. Lisbeth Renshaw  today. She will likely benefit from breast radiation if she undergo lumpectomy to decrease the risk of breast cancer.  -We also discussed the breast cancer surveillance after her surgery. She will continue annual screening mammogram, self exam, and a routine office visit with lab and exam with [place]. -I encouraged her to have healthy diet and exercise regularly.   2. Bone Health  -Her most recent DEXA on 01/30/20 was normal, T-score of +0.3.   PLAN:  -proceed with lumpectomy -oncotype on surgical sample only if tumor >1cm -will see her back after radiation or surgery   Oncology History  Malignant neoplasm of upper-outer quadrant of left breast in female, estrogen receptor positive ([place])  02/11/2022 Initial Biopsy   Diagnosis Breast, left, needle core biopsy, 2 o'clock, 8cmfn - INVASIVE DUCTAL CARCINOMA, SEE NOTE - DUCTAL CARCINOMA IN SITU, INTERMEDIATE GRADE - TUBULE FORMATION: SCORE 3 - NUCLEAR PLEOMORPHISM: SCORE 2 - MITOTIC COUNT: SCORE 1 - TOTAL SCORE: 6 - OVERALL GRADE: 2 - LYMPHOVASCULAR INVASION: NOT IDENTIFIED - CANCER LENGTH: 0.3 CM - CALCIFICATIONS: NOT IDENTIFIED - OTHER FINDINGS: NONE   02/17/2022 Initial Diagnosis   Malignant neoplasm of upper-outer quadrant of left breast in female, estrogen receptor positive ([place])      HISTORY OF PRESENTING ILLNESS:  Tara Davis 76 y.o. female is a here because of breast cancer. The patient was referred by [facility]. The patient presents to the clinic today alone, with her daughter-in-law (who is an Therapist, sports) on speaker phone.   She had routine screening mammography on 01/31/22 showing a possible abnormality in the left breast. She underwent left diagnostic mammography and left  breast ultrasonography on 02/06/22 showing: 0.5 cm mass at 2 o'clock.  Biopsy on 02/11/22 showed: invasive ductal carcinoma; DCIS. Prognostic indicators significant for: estrogen receptor, 100% positive and progesterone receptor, 70% positive. Proliferation marker Ki67  at 20%. HER2 negative by FISH.   She has a PMHx of.... -recent knee replacement, 10/2021 -depression, on trintelix and abilify -s/p cholecystectomy  Socially... -she works with Diplomatic Services operational officer. -she is divorced but has a long-time partner. -she reports breast cancer in her sister, who passed away at age 13. -current smoker, for many years, has tried to quit without success -currently drinks one cocktail a night.   GYN HISTORY  Menarchal: 76 years old LMP: ~age 37 Contraceptive: HRT: none GP: 2, first at age 34    REVIEW OF SYSTEMS:    Constitutional: Denies fevers, chills or abnormal night sweats Eyes: Denies blurriness of vision, double vision or watery eyes Ears, nose, mouth, throat, and face: Denies mucositis or sore throat Respiratory: Denies cough, dyspnea or wheezes Cardiovascular: Denies palpitation, chest discomfort or lower extremity swelling Gastrointestinal:  Denies nausea, heartburn or change in bowel habits Skin: Denies abnormal skin rashes Lymphatics: Denies new lymphadenopathy or easy bruising Neurological:Denies numbness, tingling or new weaknesses Behavioral/Psych: Mood is stable, no new changes  All other systems were reviewed with the patient and are negative.   MEDICAL HISTORY:  Past Medical History:  Diagnosis Date   Adenomatous colon polyp    Anxiety    Arthritis    Bilateral cataracts    within 6 mos.   Depression    Diverticulosis    Fatty liver    GERD (gastroesophageal reflux disease)    H. pylori infection    Hyperlipidemia    Ulcer 1988   Uterine fibroid     SURGICAL HISTORY: Past Surgical History:  Procedure Laterality Date   APPENDECTOMY     CATARACT EXTRACTION, BILATERAL     CHOLECYSTECTOMY     COLONOSCOPY     spontaneous vaginal delivery     x2   TONSILLECTOMY     TOTAL KNEE ARTHROPLASTY Left 10/11/2021   Procedure: LEFT TOTAL KNEE ARTHROPLASTY;  Surgeon: Newt Minion, MD;  Location: [facility];  Service: Orthopedics;   Laterality: Left;   WISDOM TOOTH EXTRACTION      SOCIAL HISTORY: Social History   Socioeconomic History   Marital status: Single    Spouse name: Not on file   Number of children: 2   Years of education: 16   Highest education level: Bachelor's degree (e.g., BA, AB, BS)  Occupational History   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 She lives with her long time boyfriend of 25+ years ina 2 level home. She drinks green tea, 2-4 cups a day. She does not exercise.   Social Determinants of Health   Financial Resource Strain: Low Risk  (06/14/2020)   Overall Emergency planning/management officer Strain (  CARDIA)    Difficulty of Paying Living Expenses: Not hard at all  Food Insecurity: No Food Insecurity (06/14/2020)   Hunger Vital Sign    Worried About Running Out of Food in the Last Year: Never true    Ran Out of Food in the Last Year: Never true  Transportation Needs: No Transportation Needs (06/14/2020)   PRAPARE - Hydrologist (Medical): No    Lack of Transportation (Non-Medical): No  Physical Activity: Inactive (06/14/2020)   Exercise Vital Sign    Days of Exercise per Week: 0 days    Minutes of Exercise per Session: 0 min  Stress: No Stress Concern Present (06/14/2020)   [place]    Feeling of Stress : Not at all  Social Connections: Not on file  Intimate Partner Violence: Not on file    FAMILY HISTORY: Family  History  Problem Relation Age of Onset   Alzheimer's disease Mother    Depression Father    Diabetes Father    Breast cancer Sister 49   Colon cancer Neg Hx    Esophageal cancer Neg Hx    Stomach cancer Neg Hx     ALLERGIES:  is allergic to amoxicillin-pot clavulanate, codeine, and penicillins.  MEDICATIONS:  Current Outpatient Medications  Medication Sig Dispense Refill   ARIPiprazole (ABILIFY) 2 MG tablet Take 4 mg by mouth daily.     atorvastatin (LIPITOR) 20 MG tablet TAKE ONE TABLET BY MOUTH DAILY 76 tablet 1   Bacillus Coagulans-Inulin (BENEFIBER PREBIOTIC+PROBIOTIC PO) Take 1 Scoop by mouth daily.     dicyclomine (BENTYL) 10 MG capsule Take 10 mg by mouth in the morning and at bedtime.     loperamide (IMODIUM A-D) 2 MG tablet Take 2 mg by mouth 4 (four) times daily as needed for diarrhea or loose stools.     LORazepam (ATIVAN) 0.5 MG tablet Take 1 mg by mouth 2 (two) times daily.      nicotine (NICODERM CQ - DOSED IN MG/24 HOURS) 21 mg/24hr patch Place 21 mg onto the skin daily as needed (nicotine).     omeprazole (PRILOSEC) 40 MG capsule Take 40 mg by mouth 2 (two) times daily before a meal.     ondansetron (ZOFRAN) 4 MG tablet Take 4 mg by mouth every 8 (eight) hours as needed for nausea or vomiting.     vortioxetine HBr (TRINTELLIX) 20 MG TABS tablet Take 20 mg by mouth daily.     No current facility-administered medications for this visit.    PHYSICAL EXAMINATION: ECOG PERFORMANCE STATUS: 1 - Symptomatic but completely ambulatory  Vitals:   02/19/22 1233  BP: (!) 166/67  Pulse: 93  Resp: 18  Temp: 97.7 F (36.5 C)  SpO2: 98%   Filed Weights   02/19/22 1233  Weight: 195 lb 6.4 oz (88.6 kg)    GENERAL:alert, no distress and comfortable SKIN: skin color, texture, turgor are normal, no rashes or significant lesions EYES: normal, Conjunctiva are pink and non-injected, sclera clear  NECK: supple, thyroid normal size, non-tender, without nodularity LYMPH:  no  palpable lymphadenopathy in the cervical, axillary  LUNGS: clear to auscultation and percussion with normal breathing effort HEART: regular rate & rhythm and no murmurs and no lower extremity edema ABDOMEN:abdomen soft, non-tender and normal bowel sounds Musculoskeletal:no cyanosis of digits and no clubbing  NEURO: alert & oriented x 3 with fluent speech, no focal motor/sensory deficits BREAST: No palpable mass, nodules  or adenopathy bilaterally. Breast exam benign.  LABORATORY DATA:  I have reviewed the data as listed    Latest Ref Rng & Units 02/19/2022   12:15 PM 10/03/2021    3:51 PM 12/07/2018    9:22 AM  CBC  WBC 4.0 - 10.5 K/uL 9.7  9.8  9.7   Hemoglobin 12.0 - 15.0 g/dL 14.7  13.7  14.5   Hematocrit 36.0 - 46.0 % 43.0  38.2  41.9   Platelets 150 - 400 K/uL 300  288  299        Latest Ref Rng & Units 02/19/2022   12:15 PM 10/03/2021    3:51 PM 12/07/2018    9:22 AM  CMP  Glucose 70 - 99 mg/dL 115  99  124   BUN 8 - 23 mg/dL 21  17  15    Creatinine 0.44 - 1.00 mg/dL 1.03  0.93  0.91   Sodium 135 - 145 mmol/L 139  144  142   Potassium 3.5 - 5.1 mmol/L 3.9  4.1  4.5   Chloride 98 - 111 mmol/L 105  106  102   CO2 22 - 32 mmol/L 27  21  19    Calcium 8.9 - 10.3 mg/dL 9.9  9.2  9.0   Total Protein 6.5 - 8.1 g/dL 7.3  6.5  6.7   Total Bilirubin 0.3 - 1.2 mg/dL 0.4  <0.2  0.3   Alkaline Phos 38 - 126 U/L 82  124  70   AST 15 - 41 U/L 21  28  21    ALT 0 - 44 U/L 25  58  30      RADIOGRAPHIC STUDIES: I have personally reviewed the radiological images as listed and agreed with the findings in the report. No results found.   No orders of the defined types were placed in this encounter.   All questions were answered. The patient knows to call the clinic with any problems, questions or concerns. The total time spent in the appointment was 45 minutes.     Truitt Merle, MD 02/19/2022   I, Wilburn Mylar, am acting as scribe for Truitt Merle, MD.   I have reviewed the above  documentation for accuracy and completeness, and I agree with the above.

## 2022-02-20 LAB — GENETIC SCREENING ORDER

## 2022-02-23 ENCOUNTER — Encounter: Payer: Self-pay | Admitting: Hematology

## 2022-02-24 ENCOUNTER — Telehealth: Payer: Self-pay | Admitting: Emergency Medicine

## 2022-02-24 NOTE — Telephone Encounter (Signed)
[employer] 2021-05 - Specimen Collection Study to Evaluate Biomarkers in Subjects with Cancer   Called to f/u on interest in study.  Pt would like to participate, but needs to call back for scheduling.  Wells Guiles 'Learta CoddingNeysa Bonito, RN, BSN Clinical Research Nurse I 02/24/22 11:19 AM

## 2022-02-27 ENCOUNTER — Telehealth: Payer: Self-pay | Admitting: *Deleted

## 2022-02-27 ENCOUNTER — Encounter: Payer: Self-pay | Admitting: *Deleted

## 2022-02-27 NOTE — Telephone Encounter (Signed)
Left VM for a return phone call to follow up from [facility] 9/13

## 2022-02-28 ENCOUNTER — Telehealth: Payer: Self-pay | Admitting: Emergency Medicine

## 2022-02-28 NOTE — Telephone Encounter (Signed)
[employer] 2021-05 - Specimen Collection Study to Evaluate Biomarkers in Subjects with Cancer   Patient called to schedule consent/lab appt for EXACT study.  Appt created for 1:30 pm on 03/05/22.  Pt aware to call back before then as needed with any questions/concerns.  Wells Guiles 'Learta CoddingNeysa Bonito, RN, BSN Clinical Research Nurse I 02/28/22 9:42 AM

## 2022-03-04 ENCOUNTER — Telehealth: Payer: Self-pay | Admitting: Genetic Counselor

## 2022-03-04 ENCOUNTER — Encounter: Payer: Self-pay | Admitting: Genetic Counselor

## 2022-03-04 DIAGNOSIS — Z1379 Encounter for other screening for genetic and chromosomal anomalies: Secondary | ICD-10-CM | POA: Insufficient documentation

## 2022-03-04 NOTE — Telephone Encounter (Signed)
I contacted Ms. Tara Davis to discuss her genetic testing results. No pathogenic variants were identified in the 36 genes analyzed. Detailed clinic note to follow.  The test report has been scanned into EPIC and is located under the Molecular Pathology section of the Results Review tab.  A portion of the result report is included below for reference.   Lucille Passy, MS, [facility] Genetic Counselor [email or web address] (P) [phone]

## 2022-03-04 NOTE — Progress Notes (Signed)
Surgical Instructions    Your procedure is scheduled on 03/12/22.  Report to [facility] Main Entrance "A" at 7:00 A.M., then check in with the Admitting office.  Call this number if you have problems the morning of surgery:  [phone]   If you have any questions prior to your surgery date call [phone]: Open Monday-Friday 8am-4pm    Remember:  Do not eat after midnight the night before your surgery  You may drink clear liquids until 6:00am the morning of your surgery.   Clear liquids allowed are: Water, Non-Citrus Juices (without pulp), Carbonated Beverages, Clear Tea, Black Coffee ONLY (NO MILK, CREAM OR POWDERED CREAMER of any kind), and Gatorade    Take these medicines the morning of surgery with A SIP OF WATER:  ARIPiprazole (ABILIFY) atorvastatin (LIPITOR) dicyclomine (BENTYL)  omeprazole (PRILOSEC)  LORazepam (ATIVAN)- if needed ondansetron [employer])- if needed  As of today, STOP taking any Aspirin (unless otherwise instructed by your surgeon) Aleve, Naproxen, Ibuprofen, Motrin, Advil, Goody's, BC's, all herbal medications, fish oil, and all vitamins.           Do not wear jewelry or makeup. Do not wear lotions, powders, perfumes or deodorant. Do not shave 48 hours prior to surgery.   Do not bring valuables to the hospital. Do not wear nail polish, gel polish, artificial nails, or any other type of covering on natural nails (fingers and toes) If you have artificial nails or gel coating that need to be removed by a nail salon, please have this removed prior to surgery. Artificial nails or gel coating may interfere with anesthesia's ability to adequately monitor your vital signs.  [facility] is not responsible for any belongings or valuables.    Do NOT Smoke (Tobacco/Vaping)  24 hours prior to your procedure  If you use a CPAP at night, you may bring your mask for your overnight stay.   Contacts, glasses, hearing aids, dentures or partials may not be worn into  surgery, please bring cases for these belongings   For patients admitted to the hospital, discharge time will be determined by your treatment team.   Patients discharged the day of surgery will not be allowed to drive home, and someone needs to stay with them for 24 hours.   SURGICAL WAITING ROOM VISITATION Patients having surgery or a procedure may have no more than 2 support people in the waiting area - these visitors may rotate.   Children under the age of 51 must have an adult with them who is not the patient. If the patient needs to stay at the hospital during part of their recovery, the visitor guidelines for inpatient rooms apply. Pre-op nurse will coordinate an appropriate time for 1 support person to accompany patient in pre-op.  This support person may not rotate.   Please refer to the [facility] website for the visitor guidelines for Inpatients (after your surgery is over and you are in a regular room).    Special instructions:    Oral Hygiene is also important to reduce your risk of infection.  Remember - BRUSH YOUR TEETH THE MORNING OF SURGERY WITH YOUR REGULAR TOOTHPASTE   [facility]- Preparing For Surgery  Before surgery, you can play an important role. Because skin is not sterile, your skin needs to be as free of germs as possible. You can reduce the number of germs on your skin by washing with CHG (chlorahexidine gluconate) Soap before surgery.  CHG is an antiseptic cleaner which kills germs and  bonds with the skin to continue killing germs even after washing.     Please do not use if you have an allergy to CHG or antibacterial soaps. If your skin becomes reddened/irritated stop using the CHG.  Do not shave (including legs and underarms) for at least 48 hours prior to first CHG shower. It is OK to shave your face.  Please follow these instructions carefully.     Shower the NIGHT BEFORE SURGERY and the MORNING OF SURGERY with CHG Soap.   If you chose to wash your  hair, wash your hair first as usual with your normal shampoo. After you shampoo, rinse your hair and body thoroughly to remove the shampoo.  Then [employer] and genitals (private parts) with your normal soap and rinse thoroughly to remove soap.  After that Use CHG Soap as you would any other liquid soap. You can apply CHG directly to the skin and wash gently with a scrungie or a clean washcloth.   Apply the CHG Soap to your body ONLY FROM THE NECK DOWN.  Do not use on open wounds or open sores. Avoid contact with your eyes, ears, mouth and genitals (private parts). Wash Face and genitals (private parts)  with your normal soap.   Wash thoroughly, paying special attention to the area where your surgery will be performed.  Thoroughly rinse your body with warm water from the neck down.  DO NOT shower/wash with your normal soap after using and rinsing off the CHG Soap.  Pat yourself dry with a CLEAN TOWEL.  Wear CLEAN PAJAMAS to bed the night before surgery  Place CLEAN SHEETS on your bed the night before your surgery  DO NOT SLEEP WITH PETS.   Day of Surgery: Take a shower with CHG soap. Wear Clean/Comfortable clothing the morning of surgery Do not apply any deodorants/lotions.   Remember to brush your teeth WITH YOUR REGULAR TOOTHPASTE.    If you received a COVID test during your pre-op visit, it is requested that you wear a mask when out in public, stay away from anyone that may not be feeling well, and notify your surgeon if you develop symptoms. If you have been in contact with anyone that has tested positive in the last 10 days, please notify your surgeon.    Please read over the following fact sheets that you were given.

## 2022-03-04 NOTE — Telephone Encounter (Signed)
I attempted to contact Tara Davis to discuss her genetic testing results (36 genes). I left a voicemail requesting she call me back at [phone].  Lucille Passy, MS, [facility] Genetic Counselor [email or web address] (P) [phone]

## 2022-03-05 ENCOUNTER — Encounter (HOSPITAL_COMMUNITY): Payer: Self-pay

## 2022-03-05 ENCOUNTER — Encounter (HOSPITAL_COMMUNITY)
Admission: RE | Admit: 2022-03-05 | Discharge: 2022-03-05 | Disposition: A | Discharge status: Home / Self Care | Payer: Medicare Other | Source: Ambulatory Visit | Attending: General Surgery | Admitting: General Surgery

## 2022-03-05 ENCOUNTER — Inpatient Hospital Stay: Payer: Medicare Other

## 2022-03-05 ENCOUNTER — Encounter: Payer: Self-pay | Admitting: Emergency Medicine

## 2022-03-05 ENCOUNTER — Other Ambulatory Visit: Payer: Self-pay

## 2022-03-05 ENCOUNTER — Inpatient Hospital Stay: Payer: Medicare Other | Admitting: Emergency Medicine

## 2022-03-05 DIAGNOSIS — Z170 Estrogen receptor positive status [ER+]: Secondary | ICD-10-CM

## 2022-03-05 DIAGNOSIS — Z01818 Encounter for other preprocedural examination: Secondary | ICD-10-CM | POA: Insufficient documentation

## 2022-03-05 LAB — RESEARCH LABS

## 2022-03-05 NOTE — Research (Signed)
[employer] 2021-05 - Specimen Collection Study to Evaluate Biomarkers in Subjects with Cancer   CONSENT  Patient Tara Davis was identified by this Clinical Research Nurse as a potential candidate for the above listed study.  This Clinical Research Nurse met with [name], [id number] on 03/05/22 in a manner and location that ensures patient privacy to discuss participation in the above listed research study.  Patient is Unaccompanied.  Patient was previously provided with informed consent documents.  Patient has not yet read the informed consent documents and so documents were reviewed page by page today.  As outlined in the informed consent form, this Nurse and [name] discussed the purpose of the research study, the investigational nature of the study, study procedures and requirements for study participation, potential risks and benefits of study participation, as well as alternatives to participation.  This study is not blinded or double-blinded. The patient understands participation is voluntary and they may withdraw from study participation at any time.  This study does not involve randomization.  This study does not involve an investigational drug or device. This study does not involve a placebo. Patient understands enrollment is pending full eligibility review.   Confidentiality and how the patient's information will be used as part of study participation were discussed.  Patient was informed there is reimbursement provided for their time and effort spent on trial participation.  The patient is encouraged to discuss research study participation with their insurance provider to determine what costs they may incur as part of study participation, including research related injury.    All questions were answered to patient's satisfaction.  The informed consent with embedded HIPAA language was reviewed page by page.  The patient's mental and emotional status is appropriate to  provide informed consent, and the patient verbalizes an understanding of study participation.  Patient has agreed to participate in the above listed research study and has voluntarily signed the informed consent version date 06/22/20 (revised 07/08/21) with embedded HIPAA language, version date 06/22/20 (revised 07/08/21)  on 03/05/22 at 1:33 PM.  The patient was provided with a copy of the signed informed consent form with embedded HIPAA language for their reference.  No study specific procedures were obtained prior to the signing of the informed consent document.  Approximately 25 minutes were spent with the patient reviewing the informed consent documents.  Patient was not requested to complete a Release of Information form.  Wells Guiles 'Learta CoddingNeysa Bonito, RN, BSN Clinical Research Nurse I 03/05/22 1:51 PM

## 2022-03-05 NOTE — Research (Signed)
[employer] 2021-05 - Specimen Collection Study to Evaluate Biomarkers in Subjects with Cancer   LATE ENTRY  Patient is post-menopausal with LMP over 12 months ago.  Wells Guiles 'Learta CoddingNeysa Bonito, RN, BSN Clinical Research Nurse I 03/05/22 3:03 PM

## 2022-03-05 NOTE — Research (Signed)
[employer] 2021-05 - Specimen Collection Study to Evaluate Biomarkers in Subjects with Cancer    03/05/2022  SECOND ELIGIBILITY:  This Coordinator has reviewed this patient's inclusion and exclusion criteria as a second review and confirms Tara Davis is eligible for study participation.  Patient may continue with enrollment.   Carol Ada, RT(R)(T) Clinical Research Coordinator

## 2022-03-05 NOTE — Research (Signed)
[employer] 2021-05 - Specimen Collection Study to Evaluate Biomarkers in Subjects with Cancer   PATIENT HISTORY  Medical History:  High Blood Pressure  No Coronary Artery Disease No Lupus    No Rheumatoid Arthritis  No Diabetes   No      If yes, which type?      N/A Lynch Syndrome  No  Is the patient currently taking a magnesium supplement?   No If yes, dose and frequency? N/A Indication? N/A Start date? N/A  Does the patient have a personal history of cancer (greater than 5 years ago)?  No If yes, Cancer type and date of diagnosis?   N/A  Has this previous diagnosis been treated? N/A      If so, treatment type? N/A   Start and end dates of last treatment cycle? N/A  Does the patient have a family history of cancer in 1st or 2nd degree relatives? Yes If yes, Relationship(s) and Cancer type(s)? Sister- breast  Does the patient have history of alcohol consumption? Yes   If yes, current or former? Current If former, year stopped? N/A Number of years? 64 Drinks per week? 10  Does the patient have history of cigarette, cigar, pipe, or chewing tobacco use?  Yes  If yes, current for former? Current If yes, type (Cigarette, cigar, pipe, and/or chewing tobacco)? Cigarette   If former, year stopped? N/A Number of years? 60 Packs/number/containers per day? 1 pack daily  Tara Davis 'Tara CoddingNeysa Bonito, RN, BSN Clinical Research Nurse I 03/05/22 2:00 PM

## 2022-03-05 NOTE — Progress Notes (Signed)
PCP - Tara Davis was primary beforehand)  Cardiologist - denies Pulmonologist: Icard last seen in 2020  PPM/ICD - denies   Chest x-ray - n/a EKG - 10/03/21 Stress Test - over 10 years ago- normal per patient ECHO - denies Cardiac Cath - denies  Sleep Study - denies   Follow your surgeon's instructions on when to stop Aspirin.  If no instructions were given by your surgeon then you will need to call the office to get those instructions.     ERAS Protcol -yes PRE-SURGERY Ensure or G2- not ordered  COVID TEST- not needed   Anesthesia review: no  Patient denies shortness of breath, fever, cough and chest pain at PAT appointment   All instructions explained to the patient, with a verbal understanding of the material. Patient agrees to go over the instructions while at home for a better understanding. Patient also instructed to self quarantine after being tested for COVID-19. The opportunity to ask questions was provided.

## 2022-03-05 NOTE — Research (Signed)
[employer] 2021-05 - Specimen Collection Study to Evaluate Biomarkers in Subjects with Cancer   ELIGIBILITY CHECK  This Nurse has reviewed this patient's inclusion and exclusion criteria and confirmed Tara Davis is eligible for study participation.  Patient will continue with enrollment.  Eligibility confirmed by treating investigator, who also agrees that patient should proceed with enrollment.  Patient confirmed the following verbally today: - They are willing/able to comply with all study procedures. - They have not been/are not currently enrolled in another [employer] example collection study or another cohort within this study   Reviewed exclusion criteria for 'all subjects' as well as 'treatment naive cohort' with patient today who verbally denied all.  Confirmed with chart review as well.  Per MD Burr Medico patient has no condition that would interfere with ability to fully participate in enrollment or protocol.  Blood Collection: Research blood obtained by Fresh venipuncture per patient's preference. Patient tolerated well without any adverse events.  Gift Card: $50 gift card given to patient for her participation in this study.   Wells Guiles 'Learta CoddingNeysa Bonito, RN, BSN Clinical Research Nurse I 03/05/22 1:52 PM

## 2022-03-10 NOTE — H&P (Signed)
REFERRING PHYSICIAN:  Karie Fetch, MD   PROVIDER:  Georgianne Fick, MD   Care Team: Patient Care Team: Georgianne Fick, MD as Consulting Provider (Surgical Oncology) Truitt Merle, MD (Hematology and Oncology) Marye Round, MD (Radiation Oncology)    MRN: [id number] DOB: April 11, 1946 DATE OF ENCOUNTER: 02/19/2022   Subjective    Chief Complaint: left breast cancer   History of Present Illness: Tara Davis is a 76 y.o. female who is seen today as an office consultation at the request of Dr. Burr Medico for evaluation of left breast cancer   Pt presents with a new diagnosis of left breast cancer 02/2022.  She had a screening detected mass.  Diagnostic imaging was performed.  This showed a 5 mm mass at 2 o'clock.  Core needle biopsy was performed.  Path was grade 2 invasive ductal carcinoma with DCIS, receptors +/+/-, Ki 67 20%.     She personally hasn't had cancer before this.     She has a sister who was diagnosed with breast cancer age 66 and passed away from her cancer.     Interestingly her son is a Freight forwarder for the [employer] baseball team.     Menarche 16 Menopause 23 ish Parity 2, first age 84 No HRT/ around 3 years of hormonal contraception   Work: Musician.     Diagnostic mammogram/us: 02/06/2022 [facility]   Pathology core needle biopsy: 02/11/2022 Breast, left, needle core biopsy, 2 o'clock, 8cmfn - INVASIVE DUCTAL CARCINOMA, SEE NOTE - DUCTAL CARCINOMA IN SITU, INTERMEDIATE GRADE - TUBULE FORMATION: SCORE 3 - NUCLEAR PLEOMORPHISM: SCORE 2 - MITOTIC COUNT: SCORE 1 - TOTAL SCORE: 6 - OVERALL GRADE: 2 - LYMPHOVASCULAR INVASION: NOT IDENTIFIED - CANCER LENGTH: 0.3 CM   Receptors: not showing up in epic, but 100% strong positive/ 70% moderate positive/neg her 2 and Ki 67 20%   Review of Systems: A complete review of systems was obtained from the patient.  I have reviewed this information and discussed as appropriate with the patient.  ROS positive  for dry cough, IBS, anxiety and deprssion       Medical History: Past Medical History      Past Medical History:  Diagnosis Date   Anxiety     Arthritis     GERD (gastroesophageal reflux disease)     Hyperlipidemia             Patient Active Problem List  Diagnosis   Malignant neoplasm of upper-outer quadrant of left breast in female, estrogen receptor positive (CMS-HCC)   Family history of breast cancer in sister   Gastroesophageal reflux disease   Malignant neoplasm of upper-outer quadrant of left breast in female, estrogen receptor positive (CMS-HCC)   Irritable bowel syndrome with diarrhea      Past Surgical History       Past Surgical History:  Procedure Laterality Date   ARTHROPLASTY TOTAL KNEE Left 10/11/2021   APPENDECTOMY       CATARACT EXTRACTION       CHOLECYSTECTOMY            Allergies       Allergies  Allergen Reactions   Codeine Other (See Comments) and Hallucination      hallucinations   REACTION: hallucinations   Penicillins Itching and Rash              Current Outpatient Medications on File Prior to Visit  Medication Sig Dispense Refill   ARIPiprazole (ABILIFY) 2 MG tablet Take by  mouth       atorvastatin (LIPITOR) 20 MG tablet Take 1 tablet by mouth once daily       dicyclomine (BENTYL) 10 mg capsule Take by mouth       methocarbamoL (ROBAXIN) 500 MG tablet Take by mouth       omeprazole (PRILOSEC) 40 MG DR capsule Take by mouth       zolpidem (AMBIEN) 10 mg tablet         LORazepam (ATIVAN) 0.5 MG tablet Take 1 mg by mouth 2 (two) times daily       nicotine (NICODERM CQ) 21 mg/24 hr patch Place onto the skin       vortioxetine (TRINTELLIX) 20 mg tablet Take 20 mg by mouth once daily        No current facility-administered medications on file prior to visit.      Family History       Family History  Problem Relation Age of Onset   Hyperlipidemia (Elevated cholesterol) Mother     Diabetes Father     Breast cancer Sister           Social History        Tobacco Use  Smoking Status Every Day   Packs/day: 1.00   Types: Cigarettes  Smokeless Tobacco Not on file      Social History  Social History         Socioeconomic History   Marital status: Single  Tobacco Use   Smoking status: Every Day      Packs/day: 1.00      Types: Cigarettes  Vaping Use   Vaping Use: Unknown  Substance and Sexual Activity   Alcohol use: Yes      Comment: "One to two drinks a day"   Drug use: Never   Sexual activity: Defer        Objective:         Vitals:    02/19/22 1611  BP: (!) 166/67  Pulse: 93  Resp: 18  Temp: 36.5 C (97.7 F)  Weight: 88.6 kg (195 lb 6.4 oz)  Height: 160 cm ('5\' 3"'$ )    Body mass index is 34.61 kg/m.   Gen:  No acute distress.  Well nourished and well groomed.   Neurological: Alert and oriented to person, place, and time. Coordination normal.  Head: Normocephalic and atraumatic.  Eyes: Conjunctivae are normal. Pupils are equal, round, and reactive to light. No scleral icterus.  Neck: Normal range of motion. Neck supple. No tracheal deviation or thyromegaly present.  Cardiovascular: Normal rate, regular rhythm, normal heart sounds and intact distal pulses.  Exam reveals no gallop and no friction rub.  No murmur heard. Breast: left breast with some bruising.  Similar size.  Ptotic bilaterally. No palpable masses.  No skin dimpling, no nipple retraction or nipple discharge.  No Lad.   Respiratory: Effort normal.  No respiratory distress. No chest wall tenderness. Breath sounds normal.  No wheezes, rales or rhonchi.  GI: Soft. Bowel sounds are normal. The abdomen is soft and nontender.  There is no rebound and no guarding.  Musculoskeletal: Normal range of motion. Extremities are nontender.  Lymphadenopathy: No cervical, preauricular, postauricular or axillary adenopathy is present Skin: Skin is warm and dry. No rash noted. No diaphoresis. No erythema. No pallor. No clubbing, cyanosis, or  edema.   Psychiatric: Normal mood and affect. Behavior is normal. Judgment and thought content normal.      Labs CBC, CMET essentially normal 02/19/2022  Assessment and Plan:        ICD-10-CM    1. Malignant neoplasm of upper-outer quadrant of left breast in female, estrogen receptor positive (CMS-HCC)  C50.412      Z17.0       2. Family history of breast cancer in sister  Z80.3         Pt has a new diagnosis of cT1aN0 left breast cancer. She is a candidate for breast conservation.  Due to age, size of tumor, and prognostic profile, we can omit sentinel node biopsy.  Radiation tx would be determined on final path including margins. Antihormonal treatment will be recommended.     Genetic testing was offered.     The surgical procedure was described to the patient.  I discussed the incision type and location and that we will need radiology involved on with a seed marker.       Her daughter in Sports coach (nurse in [place]) was present by phone.     We discussed the risks bleeding, infection, damage to other structures, need for further procedures/surgeries.  We discussed the risk of seroma.  The patient was advised if the breast has cancer, we may need to go back to surgery for additional tissue to obtain negative margins or for a lymph node biopsy. The patient was advised that these are the most common complications, but that others can occur as well. I discussed the risk of alteration in breast contour or size.  I discussed risk of chronic pain.  There are rare instances of heart/lung issues post op as well as blood clots.      They were advised against taking aspirin or other anti-inflammatory agents/blood thinners the week before surgery.     The risks and benefits of the procedure were described to the patient and she wishes to proceed.       Milus Height, MD FACS Surgical Oncology, General Surgery, Trauma and [facility] [place]

## 2022-03-11 DIAGNOSIS — C50412 Malignant neoplasm of upper-outer quadrant of left female breast: Secondary | ICD-10-CM | POA: Diagnosis not present

## 2022-03-12 ENCOUNTER — Encounter (HOSPITAL_COMMUNITY): Payer: Self-pay | Admitting: General Surgery

## 2022-03-12 ENCOUNTER — Ambulatory Visit (HOSPITAL_COMMUNITY)
Admission: RE | Admit: 2022-03-12 | Discharge: 2022-03-12 | Disposition: A | Discharge status: Home / Self Care | Payer: Medicare Other | Attending: General Surgery | Admitting: General Surgery

## 2022-03-12 ENCOUNTER — Other Ambulatory Visit: Payer: Self-pay

## 2022-03-12 ENCOUNTER — Ambulatory Visit (HOSPITAL_COMMUNITY): Payer: Medicare Other | Admitting: Certified Registered Nurse Anesthetist

## 2022-03-12 ENCOUNTER — Encounter (HOSPITAL_COMMUNITY)
Admission: RE | Disposition: A | Discharge status: Home / Self Care | Payer: Self-pay | Source: Home / Self Care | Attending: General Surgery

## 2022-03-12 ENCOUNTER — Ambulatory Visit (HOSPITAL_BASED_OUTPATIENT_CLINIC_OR_DEPARTMENT_OTHER): Payer: Medicare Other | Admitting: Certified Registered Nurse Anesthetist

## 2022-03-12 DIAGNOSIS — F17210 Nicotine dependence, cigarettes, uncomplicated: Secondary | ICD-10-CM | POA: Insufficient documentation

## 2022-03-12 DIAGNOSIS — Z170 Estrogen receptor positive status [ER+]: Secondary | ICD-10-CM

## 2022-03-12 DIAGNOSIS — M1990 Unspecified osteoarthritis, unspecified site: Secondary | ICD-10-CM

## 2022-03-12 DIAGNOSIS — E669 Obesity, unspecified: Secondary | ICD-10-CM | POA: Diagnosis not present

## 2022-03-12 DIAGNOSIS — C50912 Malignant neoplasm of unspecified site of left female breast: Secondary | ICD-10-CM | POA: Diagnosis not present

## 2022-03-12 DIAGNOSIS — K219 Gastro-esophageal reflux disease without esophagitis: Secondary | ICD-10-CM | POA: Diagnosis not present

## 2022-03-12 DIAGNOSIS — Z803 Family history of malignant neoplasm of breast: Secondary | ICD-10-CM | POA: Diagnosis not present

## 2022-03-12 DIAGNOSIS — Z6834 Body mass index (BMI) 34.0-34.9, adult: Secondary | ICD-10-CM | POA: Diagnosis not present

## 2022-03-12 DIAGNOSIS — C50412 Malignant neoplasm of upper-outer quadrant of left female breast: Secondary | ICD-10-CM | POA: Insufficient documentation

## 2022-03-12 DIAGNOSIS — F418 Other specified anxiety disorders: Secondary | ICD-10-CM

## 2022-03-12 HISTORY — PX: BREAST LUMPECTOMY WITH RADIOACTIVE SEED LOCALIZATION: SHX6424

## 2022-03-12 SURGERY — BREAST LUMPECTOMY WITH RADIOACTIVE SEED LOCALIZATION
Anesthesia: Monitor Anesthesia Care | Site: Breast | Laterality: Left

## 2022-03-12 MED ORDER — ONDANSETRON HCL 8 MG PO TABS: 4.0000 mg | ORAL_TABLET | Freq: Three times a day (TID) | ORAL | 1 refills | Status: DC | PRN

## 2022-03-12 MED ORDER — ONDANSETRON HCL 4 MG PO TABS
4.0000 mg | ORAL_TABLET | Freq: Three times a day (TID) | ORAL | 5 refills | Status: AC | PRN
Start: 2022-03-12 — End: ?

## 2022-03-12 MED ORDER — CHLORHEXIDINE GLUCONATE CLOTH 2 % EX PADS: 6.0000 | MEDICATED_PAD | Freq: Once | CUTANEOUS | Status: DC

## 2022-03-12 MED ORDER — PHENYLEPHRINE HCL-NACL 20-0.9 MG/250ML-% IV SOLN
INTRAVENOUS | Status: DC | PRN
  Administered 2022-03-12: 75 ug/min via INTRAVENOUS
  Administered 2022-03-12: 50 ug/min via INTRAVENOUS

## 2022-03-12 MED ORDER — CHLORHEXIDINE GLUCONATE 0.12 % MT SOLN
15.0000 mL | Freq: Once | OROMUCOSAL | Status: AC
  Administered 2022-03-12: 15 mL via OROMUCOSAL
  Filled 2022-03-12: qty 15

## 2022-03-12 MED ORDER — TRAMADOL HCL 50 MG PO TABS: 50.0000 mg | ORAL_TABLET | Freq: Four times a day (QID) | ORAL | 0 refills | Status: DC | PRN

## 2022-03-12 MED ORDER — ORAL CARE MOUTH RINSE: 15.0000 mL | Freq: Once | OROMUCOSAL | Status: AC

## 2022-03-12 MED ORDER — BUPIVACAINE-EPINEPHRINE (PF) 0.25% -1:200000 IJ SOLN
INTRAMUSCULAR | Status: AC
  Filled 2022-03-12: qty 30

## 2022-03-12 MED ORDER — FENTANYL CITRATE (PF) 250 MCG/5ML IJ SOLN
INTRAMUSCULAR | Status: AC
  Filled 2022-03-12: qty 5

## 2022-03-12 MED ORDER — ACETAMINOPHEN 325 MG PO TABS: 325.0000 mg | ORAL_TABLET | ORAL | Status: DC | PRN

## 2022-03-12 MED ORDER — PROPOFOL 10 MG/ML IV BOLUS
INTRAVENOUS | Status: DC | PRN
  Administered 2022-03-12 (×2): 30 mg via INTRAVENOUS

## 2022-03-12 MED ORDER — 0.9 % SODIUM CHLORIDE (POUR BTL) OPTIME
TOPICAL | Status: DC | PRN
  Administered 2022-03-12: 1000 mL

## 2022-03-12 MED ORDER — PROPOFOL 500 MG/50ML IV EMUL
INTRAVENOUS | Status: DC | PRN
  Administered 2022-03-12: 100 ug/kg/min via INTRAVENOUS

## 2022-03-12 MED ORDER — LIDOCAINE HCL (PF) 1 % IJ SOLN
INTRAMUSCULAR | Status: AC
  Filled 2022-03-12: qty 30

## 2022-03-12 MED ORDER — LACTATED RINGERS IV SOLN: INTRAVENOUS | Status: DC

## 2022-03-12 MED ORDER — FENTANYL CITRATE (PF) 100 MCG/2ML IJ SOLN: 25.0000 ug | INTRAMUSCULAR | Status: DC | PRN

## 2022-03-12 MED ORDER — ONDANSETRON HCL 4 MG/2ML IJ SOLN
INTRAMUSCULAR | Status: DC | PRN
  Administered 2022-03-12: 4 mg via INTRAVENOUS

## 2022-03-12 MED ORDER — ONDANSETRON HCL 4 MG/2ML IJ SOLN: 4.0000 mg | Freq: Once | INTRAMUSCULAR | Status: DC | PRN

## 2022-03-12 MED ORDER — FENTANYL CITRATE (PF) 250 MCG/5ML IJ SOLN
INTRAMUSCULAR | Status: DC | PRN
  Administered 2022-03-12 (×4): 50 ug via INTRAVENOUS

## 2022-03-12 MED ORDER — CIPROFLOXACIN IN D5W 400 MG/200ML IV SOLN
400.0000 mg | INTRAVENOUS | Status: AC
  Administered 2022-03-12: 400 mg via INTRAVENOUS
  Filled 2022-03-12: qty 200

## 2022-03-12 MED ORDER — ACETAMINOPHEN 500 MG PO TABS
1000.0000 mg | ORAL_TABLET | ORAL | Status: AC
  Administered 2022-03-12: 1000 mg via ORAL
  Filled 2022-03-12: qty 2

## 2022-03-12 MED ORDER — MIDAZOLAM HCL 2 MG/2ML IJ SOLN
INTRAMUSCULAR | Status: AC
  Filled 2022-03-12: qty 2

## 2022-03-12 MED ORDER — MIDAZOLAM HCL 2 MG/2ML IJ SOLN
INTRAMUSCULAR | Status: DC | PRN
  Administered 2022-03-12: 2 mg via INTRAVENOUS

## 2022-03-12 MED ORDER — LIDOCAINE HCL 1 % IJ SOLN
INTRAMUSCULAR | Status: DC | PRN
  Administered 2022-03-12: 40 mL

## 2022-03-12 MED ORDER — OXYCODONE HCL 5 MG/5ML PO SOLN: 5.0000 mg | Freq: Once | ORAL | Status: DC | PRN

## 2022-03-12 MED ORDER — OXYCODONE HCL 5 MG PO TABS: 5.0000 mg | ORAL_TABLET | Freq: Once | ORAL | Status: DC | PRN

## 2022-03-12 MED ORDER — ACETAMINOPHEN 160 MG/5ML PO SOLN: 325.0000 mg | ORAL | Status: DC | PRN

## 2022-03-12 SURGICAL SUPPLY — 37 items
BAG COUNTER SPONGE SURGICOUNT (BAG) ×1 IMPLANT
BLADE SURG 10 STRL SS (BLADE) ×1 IMPLANT
CANISTER SUCT 3000ML PPV (MISCELLANEOUS) IMPLANT
CHLORAPREP W/TINT 26 (MISCELLANEOUS) ×1 IMPLANT
CLIP VESOCCLUDE LG 6/CT (CLIP) ×1 IMPLANT
COVER PROBE W GEL 5X96 (DRAPES) ×1 IMPLANT
COVER SURGICAL LIGHT HANDLE (MISCELLANEOUS) ×1 IMPLANT
DERMABOND ADVANCED .7 DNX12 (GAUZE/BANDAGES/DRESSINGS) ×1 IMPLANT
DEVICE DUBIN SPECIMEN MAMMOGRA (MISCELLANEOUS) ×1 IMPLANT
DRAPE CHEST BREAST 15X10 FENES (DRAPES) ×1 IMPLANT
ELECT COATED BLADE 2.86 ST (ELECTRODE) ×1 IMPLANT
ELECT REM PT RETURN 9FT ADLT (ELECTROSURGICAL) ×1
ELECTRODE REM PT RTRN 9FT ADLT (ELECTROSURGICAL) ×1 IMPLANT
GAUZE PAD ABD 8X10 STRL (GAUZE/BANDAGES/DRESSINGS) ×1 IMPLANT
GAUZE SPONGE 4X4 12PLY STRL LF (GAUZE/BANDAGES/DRESSINGS) ×1 IMPLANT
GLOVE BIO SURGEON STRL SZ 6 (GLOVE) ×1 IMPLANT
GLOVE INDICATOR 6.5 STRL GRN (GLOVE) ×1 IMPLANT
GOWN STRL REUS W/ TWL LRG LVL3 (GOWN DISPOSABLE) ×1 IMPLANT
GOWN STRL REUS W/TWL 2XL LVL3 (GOWN DISPOSABLE) ×1 IMPLANT
GOWN STRL REUS W/TWL LRG LVL3 (GOWN DISPOSABLE) ×1
KIT BASIN OR (CUSTOM PROCEDURE TRAY) ×1 IMPLANT
KIT MARKER MARGIN INK (KITS) ×1 IMPLANT
NDL HYPO 25GX1X1/2 BEV (NEEDLE) ×1 IMPLANT
NEEDLE HYPO 25GX1X1/2 BEV (NEEDLE) ×1 IMPLANT
NS IRRIG 1000ML POUR BTL (IV SOLUTION) IMPLANT
PACK GENERAL/GYN (CUSTOM PROCEDURE TRAY) ×1 IMPLANT
STRIP CLOSURE SKIN 1/2X4 (GAUZE/BANDAGES/DRESSINGS) ×1 IMPLANT
STRIP CLOSURE SKIN 1/4X3 (GAUZE/BANDAGES/DRESSINGS) IMPLANT
SUT MNCRL AB 4-0 PS2 18 (SUTURE) ×1 IMPLANT
SUT SILK 2 0 SH (SUTURE) IMPLANT
SUT VIC AB 2-0 SH 27 (SUTURE) ×1
SUT VIC AB 2-0 SH 27XBRD (SUTURE) ×1 IMPLANT
SUT VIC AB 3-0 SH 27 (SUTURE) ×1
SUT VIC AB 3-0 SH 27X BRD (SUTURE) ×1 IMPLANT
SYR CONTROL 10ML LL (SYRINGE) ×1 IMPLANT
TOWEL GREEN STERILE (TOWEL DISPOSABLE) ×1 IMPLANT
TOWEL GREEN STERILE FF (TOWEL DISPOSABLE) ×1 IMPLANT

## 2022-03-12 NOTE — Anesthesia Preprocedure Evaluation (Signed)
Anesthesia Evaluation  Patient identified by MRN, date of birth, ID band Patient awake    Reviewed: Allergy & Precautions, NPO status , Patient's Chart, lab work & pertinent test results  Airway Mallampati: I       Dental no notable dental hx.    Pulmonary neg pulmonary ROS, Current Smoker and Patient abstained from smoking.,    Pulmonary exam normal        Cardiovascular negative cardio ROS Normal cardiovascular exam     Neuro/Psych PSYCHIATRIC DISORDERS Anxiety Depression negative neurological ROS     GI/Hepatic Neg liver ROS, GERD  Medicated,  Endo/Other  negative endocrine ROS  Renal/GU negative Renal ROS  negative genitourinary   Musculoskeletal  (+) Arthritis , Osteoarthritis,    Abdominal (+) + obese,   Peds  Hematology negative hematology ROS (+)   Anesthesia Other Findings   Reproductive/Obstetrics                             Anesthesia Physical Anesthesia Plan  ASA: 2  Anesthesia Plan: MAC   Post-op Pain Management:    Induction:   PONV Risk Score and Plan: 1 and Propofol infusion, TIVA and Ondansetron  Airway Management Planned: Natural Airway and Simple Face Mask  Additional Equipment: None  Intra-op Plan:   Post-operative Plan:   Informed Consent: I have reviewed the patients History and Physical, chart, labs and discussed the procedure including the risks, benefits and alternatives for the proposed anesthesia with the patient or authorized representative who has indicated his/her understanding and acceptance.     Dental advisory given  Plan Discussed with: CRNA  Anesthesia Plan Comments:         Anesthesia Quick Evaluation

## 2022-03-12 NOTE — Op Note (Signed)
Left Breast Radioactive seed localized lumpectomy  Indications: This patient presents with history of left breast cancer, UOQ, grade 2 invasive ductal carcinoma with DCIS, cT1bN0, strongly ER +/100%, moderate PR/70%, her 2 neg, Ki 67 20%  Pre-operative Diagnosis: left breast cancer  Post-operative Diagnosis: Same  Surgeon: [place]:  Malachi Pro, PA-C  Anesthesia: General endotracheal anesthesia  ASA Class: 2  Procedure Details  The patient was seen in the Holding Room. The risks, benefits, complications, treatment options, and expected outcomes were discussed with the patient. The possibilities of bleeding, infection, the need for additional procedures, failure to diagnose a condition, and creating a complication requiring transfusion or operation were discussed with the patient. The patient concurred with the proposed plan, giving informed consent.  The site of surgery properly noted/marked. The patient was taken to Operating Room # 6, identified, and the procedure verified as Left Breast Seed localized Lumpectomy with sentinel lymph node biopsy. A Time Out was held and the above information confirmed.  The left arm, breast, and chest were prepped and draped in standard fashion. The lumpectomy was performed by creating a transverse lateral incision near the previously placed radioactive seed.  Dissection was carried down to around the point of maximum signal intensity. The cautery was used to perform the dissection.  Hemostasis was achieved with cautery.   The specimen was inked with the margin marker paint kit.    Specimen radiography confirmed inclusion of the mammographic lesion, the clip, and the seed.  The background signal in the breast was zero.   The clip and seed appeared close to the medial and superior edges on the specimen mammogram, so additional margins were taken.  Inferior, medial, superior, and posterior margins were taken.  Anterior margin is skin.  The edges of  the cavity were marked with large clips, with one each medial, lateral, inferior and superior, and two clips posteriorly. The wound was irrigated and closed with 3-0 vicryl in layers and 4-0 monocryl subcuticular suture.    Sterile dressings were applied. At the end of the operation, all sponge, instrument, and needle counts were correct.  Findings: grossly clear surgical margins and no adenopathy, posterior margin is now pectoralis, anterior margin is skin  Estimated Blood Loss:  min         Specimens: left breast tissue with seed, additional inferior margin, additional medial margin, additional superior margin, additional posterior margin.           Complications:  None; patient tolerated the procedure well.         Disposition: PACU - hemodynamically stable.         Condition: stable

## 2022-03-12 NOTE — Anesthesia Postprocedure Evaluation (Signed)
Anesthesia Post Note  Patient: Tara Davis  Procedure(s) Performed: LEFT BREAST LUMPECTOMY WITH RADIOACTIVE SEED LOCALIZATION (Left: Breast)     Patient location during evaluation: PACU Anesthesia Type: MAC Level of consciousness: awake Pain management: pain level controlled Vital Signs Assessment: post-procedure vital signs reviewed and stable Respiratory status: spontaneous breathing Cardiovascular status: stable Postop Assessment: no apparent nausea or vomiting Anesthetic complications: no   No notable events documented.  Last Vitals:  Vitals:   03/12/22 1050 03/12/22 1105  BP: 136/64 (!) 157/69  Pulse: 71 70  Resp: 15 17  Temp:  36.5 C  SpO2: 94% 95%    Last Pain:  Vitals:   03/12/22 1105  TempSrc:   PainSc: Allison Jr

## 2022-03-12 NOTE — Interval H&P Note (Signed)
History and Physical Interval Note:  03/12/2022 8:58 AM  Tara Davis  has presented today for surgery, with the diagnosis of LEFT BREAST CANCER.  The various methods of treatment have been discussed with the patient and family. After consideration of risks, benefits and other options for treatment, the patient has consented to  Procedure(s): LEFT BREAST LUMPECTOMY WITH RADIOACTIVE SEED LOCALIZATION (Left) as a surgical intervention.  The patient's history has been reviewed, patient examined, no change in status, stable for surgery.  I have reviewed the patient's chart and labs.  Questions were answered to the patient's satisfaction.     Stark Klein

## 2022-03-12 NOTE — Discharge Instructions (Addendum)
[facility] Office Phone Number [phone]  BREAST BIOPSY/ PARTIAL MASTECTOMY: POST OP INSTRUCTIONS  Always review your discharge instruction sheet given to you by the facility where your surgery was performed.  IF YOU HAVE DISABILITY OR FAMILY LEAVE FORMS, YOU MUST BRING THEM TO THE OFFICE FOR PROCESSING.  DO NOT GIVE THEM TO YOUR DOCTOR.  Take 2 tylenol (acetominophen) three times a day for 3 days.  If you still have pain, add ibuprofen with food in between if able to take this (if you have kidney issues or stomach issues, do not take ibuprofen).  If both of those are not enough, add the narcotic pain pill.  If you find you are needing a lot of this overnight after surgery, call the next morning for a refill.    Prescriptions will not be filled after 5pm or on week-ends. Take your usually prescribed medications unless otherwise directed You should eat very light the first 24 hours after surgery, such as soup, crackers, pudding, etc.  Resume your normal diet the day after surgery. Most patients will experience some swelling and bruising in the breast.  Ice packs and a good support bra will help.  Swelling and bruising can take several days to resolve.  It is common to experience some constipation if taking pain medication after surgery.  Increasing fluid intake and taking a stool softener will usually help or prevent this problem from occurring.  A mild laxative (Milk of Magnesia or Miralax) should be taken according to package directions if there are no bowel movements after 48 hours. Unless discharge instructions indicate otherwise, you may remove your bandages 48 hours after surgery, and you may shower at that time.  You may have steri-strips (small skin tapes) in place directly over the incision.  These strips should be left on the skin at least for for 7-10 days.    ACTIVITIES:  You may resume regular daily activities (gradually increasing) beginning the next day.  Wearing a  good support bra or sports bra (or the breast binder) minimizes pain and swelling.  You may have sexual intercourse when it is comfortable. No heavy lifting for 1-2 weeks (not over around 10 pounds).  You may drive when you no longer are taking prescription pain medication, you can comfortably wear a seatbelt, and you can safely maneuver your car and apply brakes. RETURN TO WORK:  __________3-14 days depending on job. _______________ You should see your doctor in the office for a follow-up appointment approximately two weeks after your surgery.  Your doctor's nurse will typically make your follow-up appointment when she calls you with your pathology report.  Expect your pathology report 3-4 business days after your surgery.  You may call to check if you do not hear from us after three days.   WHEN TO CALL YOUR DOCTOR: Fever over 101.0 Nausea and/or vomiting. Extreme swelling or bruising. Continued bleeding from incision. Increased pain, redness, or drainage from the incision.  The clinic staff is available to answer your questions during regular business hours.  Please don't hesitate to call and ask to speak to one of the nurses for clinical concerns.  If you have a medical emergency, go to the nearest emergency room or call 911.  A surgeon from [facility] is always on call at the hospital.  For further questions, please visit [email or web address]   

## 2022-03-12 NOTE — Transfer of Care (Signed)
Immediate Anesthesia Transfer of Care Note  Patient: Tara Davis  Procedure(s) Performed: LEFT BREAST LUMPECTOMY WITH RADIOACTIVE SEED LOCALIZATION (Left: Breast)  Patient Location: PACU  Anesthesia Type:MAC  Level of Consciousness: awake, alert , and oriented  Airway & Oxygen Therapy: Patient Spontanous Breathing  Post-op Assessment: Report given to RN and Post -op Vital signs reviewed and stable  Post vital signs: Reviewed and stable  Last Vitals:  Vitals Value Taken Time  BP 160/71 03/12/22 1036  Temp 36.6 C 03/12/22 1035  Pulse 73 03/12/22 1040  Resp 17 03/12/22 1040  SpO2 93 % 03/12/22 1040  Vitals shown include unvalidated device data.  Last Pain:  Vitals:   03/12/22 0718  TempSrc:   PainSc: 0-No pain         Complications: No notable events documented.

## 2022-03-13 ENCOUNTER — Encounter (HOSPITAL_COMMUNITY): Payer: Self-pay | Admitting: General Surgery

## 2022-03-13 LAB — SURGICAL PATHOLOGY

## 2022-03-17 ENCOUNTER — Ambulatory Visit: Payer: Self-pay | Admitting: Genetic Counselor

## 2022-03-17 DIAGNOSIS — Z1379 Encounter for other screening for genetic and chromosomal anomalies: Secondary | ICD-10-CM

## 2022-03-17 NOTE — Progress Notes (Signed)
HPI:   Ms. Schroyer was previously seen in the [facility] clinic due to a personal and family history of cancer and concerns regarding a hereditary predisposition to cancer. Please refer to our prior cancer genetics clinic note for more information regarding our discussion, assessment and recommendations, at the time. Ms. Schiller recent genetic test results were disclosed to her, as were recommendations warranted by these results. These results and recommendations are discussed in more detail below.  CANCER HISTORY:  Oncology History  Malignant neoplasm of upper-outer quadrant of left breast in female, estrogen receptor positive ([place])  02/11/2022 Initial Biopsy   Diagnosis Breast, left, needle core biopsy, 2 o'clock, 8cmfn - INVASIVE DUCTAL CARCINOMA, SEE NOTE - DUCTAL CARCINOMA IN SITU, INTERMEDIATE GRADE - TUBULE FORMATION: SCORE 3 - NUCLEAR PLEOMORPHISM: SCORE 2 - MITOTIC COUNT: SCORE 1 - TOTAL SCORE: 6 - OVERALL GRADE: 2 - LYMPHOVASCULAR INVASION: NOT IDENTIFIED - CANCER LENGTH: 0.3 CM - CALCIFICATIONS: NOT IDENTIFIED - OTHER FINDINGS: NONE   02/11/2022 Cancer Staging   Staging form: Breast, AJCC 8th Edition - Clinical stage from 02/11/2022: Stage IA (cT1a, cN0, cM0, G2, ER+, PR+, HER2-) - Signed by Truitt Merle, MD on 02/23/2022 Stage prefix: Initial diagnosis Method of lymph node assessment: Clinical Histologic grading system: 3 grade system   02/17/2022 Initial Diagnosis   Malignant neoplasm of upper-outer quadrant of left breast in female, estrogen receptor positive ([place])    Genetic Testing   [facility] CancerNext was Negative. Report date is 03/01/2022.  The CancerNext gene panel offered by [employer] includes sequencing, rearrangement analysis, and RNA analysis for the following 36 genes:   APC, ATM, AXIN2, BARD1, BMPR1A, BRCA1, BRCA2, BRIP1, CDH1, CDK4, CDKN2A, CHEK2, DICER1, HOXB13, EPCAM, GREM1, MLH1, MSH2, MSH3, MSH6, MUTYH, NBN, NF1, NTHL1, PALB2, PMS2, POLD1,  POLE, PTEN, RAD51C, RAD51D, RECQL, SMAD4, SMARCA4, STK11, and TP53.      FAMILY HISTORY:  We obtained a detailed, 4-generation family history.  Significant diagnoses are listed below:      Family History  Problem Relation Age of Onset   Alzheimer's disease Mother     Depression Father     Diabetes Father     Breast cancer Sister 57   Colon cancer Neg Hx     Esophageal cancer Neg Hx     Stomach cancer Neg Hx         Ms. Norbeck's sister was diagnosed with breast cancer at age 46 and died due to metastatic breast cancer at age 71. Ms. Skeens is unaware of previous family history of genetic testing for hereditary cancer risks. There is no reported Ashkenazi Jewish ancestry.   GENETIC TEST RESULTS:  The CancerNext Panel found no pathogenic mutations.   The CancerNext gene panel offered by [employer] includes sequencing, rearrangement analysis, and RNA analysis for the following 36 genes:   APC, ATM, AXIN2, BARD1, BMPR1A, BRCA1, BRCA2, BRIP1, CDH1, CDK4, CDKN2A, CHEK2, DICER1, HOXB13, EPCAM, GREM1, MLH1, MSH2, MSH3, MSH6, MUTYH, NBN, NF1, NTHL1, PALB2, PMS2, POLD1, POLE, PTEN, RAD51C, RAD51D, RECQL, SMAD4, SMARCA4, STK11, and TP53.   The test report has been scanned into EPIC and is located under the Molecular Pathology section of the Results Review tab.  A portion of the result report is included below for reference. Genetic testing reported out on 03/01/2022.        Even though a pathogenic variant was not identified, possible explanations for the cancer in the family may include: There may be no hereditary risk for cancer in the  family. The cancers in Ms. Lenzo and/or her family may be due to other genetic or environmental factors. There may be a gene mutation in one of these genes that current testing methods cannot detect, but that chance is small. There could be another gene that has not yet been discovered, or that we have not yet tested, that is responsible for the cancer  diagnoses in the family.   Therefore, it is important to remain in touch with cancer genetics in the future so that we can continue to offer Ms. Alber the most up to date genetic testing.    ADDITIONAL GENETIC TESTING:  We discussed with Ms. Nierman that her genetic testing was fairly extensive.  If there are genes identified to increase cancer risk that can be analyzed in the future, we would be happy to discuss and coordinate this testing at that time.    CANCER SCREENING RECOMMENDATIONS:  Ms. Labarbera test result is considered negative (normal).  This means that we have not identified a hereditary cause for her personal and family history of cancer at this time.    An individual's cancer risk and medical management are not determined by genetic test results alone. Overall cancer risk assessment incorporates additional factors, including personal medical history, family history, and any available genetic information that may result in a personalized plan for cancer prevention and surveillance. Therefore, it is recommended she continue to follow the cancer management and screening guidelines provided by her oncology and primary healthcare provider.  RECOMMENDATIONS FOR FAMILY MEMBERS:   Since she did not inherit a mutation in a cancer predisposition gene included on this panel, her children could not have inherited a mutation from her in one of these genes. Individuals in this family might be at some increased risk of developing cancer, over the general population risk, due to the family history of cancer. We recommend women in this family have a yearly mammogram beginning at age 40, or 48 years younger than the earliest onset of cancer, an annual clinical breast exam, and perform monthly breast self-exams.  FOLLOW-UP:  Cancer genetics is a rapidly advancing field and it is possible that new genetic tests will be appropriate for her and/or her family members in the future. We encouraged her to  remain in contact with cancer genetics on an annual basis so we can update her personal and family histories and let her know of advances in cancer genetics that may benefit this family.   Our contact number was provided. Ms. Mandt questions were answered to her satisfaction, and she knows she is welcome to call us at anytime with additional questions or concerns.   Lucille Passy, MS, [facility] Genetic Counselor [email or web address] (P) [phone]

## 2022-03-18 ENCOUNTER — Encounter (HOSPITAL_COMMUNITY): Payer: Self-pay

## 2022-03-19 ENCOUNTER — Encounter: Payer: Self-pay | Admitting: *Deleted

## 2022-03-19 DIAGNOSIS — C50412 Malignant neoplasm of upper-outer quadrant of left female breast: Secondary | ICD-10-CM

## 2022-03-21 ENCOUNTER — Telehealth: Payer: Self-pay

## 2022-03-21 NOTE — Telephone Encounter (Signed)
Pt called stating that she got a call from Radiation regarding setting up and appt. Pt stated that was not what was initially discussed with Dr. Burr Medico and would like to speak with Dr. Burr Medico to see if something changed in her tx plan since they last talked.  Pt would like for Dr. Burr Medico to contact her.  Notified Dr. Burr Medico of the pt's request.

## 2022-03-25 ENCOUNTER — Other Ambulatory Visit: Payer: Self-pay | Admitting: Radiation Oncology

## 2022-03-25 ENCOUNTER — Ambulatory Visit
Admission: RE | Admit: 2022-03-25 | Discharge: 2022-03-25 | Disposition: A | Discharge status: Home / Self Care | Payer: Self-pay | Source: Ambulatory Visit | Attending: Radiation Oncology | Admitting: Radiation Oncology

## 2022-03-25 ENCOUNTER — Inpatient Hospital Stay
Admission: RE | Admit: 2022-03-25 | Discharge: 2022-03-25 | Disposition: A | Discharge status: Home / Self Care | Payer: Self-pay | Source: Ambulatory Visit | Attending: Radiation Oncology | Admitting: Radiation Oncology

## 2022-03-25 ENCOUNTER — Encounter: Payer: Self-pay | Admitting: *Deleted

## 2022-03-25 DIAGNOSIS — C50412 Malignant neoplasm of upper-outer quadrant of left female breast: Secondary | ICD-10-CM

## 2022-03-28 ENCOUNTER — Encounter: Payer: Self-pay | Admitting: *Deleted

## 2022-03-31 ENCOUNTER — Other Ambulatory Visit (HOSPITAL_COMMUNITY): Payer: Self-pay | Admitting: Psychiatry

## 2022-04-14 NOTE — Progress Notes (Signed)
Radiation Oncology         [phone] ________________________________  Name: Tara Davis        MRN: [id number]  Date of Service: 04/16/2022 DOB: 1945/09/09  [id number], No Pcp Per  Truitt Merle, MD     REFERRING PHYSICIAN: Truitt Merle, MD   DIAGNOSIS: The encounter diagnosis was Malignant neoplasm of upper-outer quadrant of left breast in female, estrogen receptor positive ([place]).   HISTORY OF PRESENT ILLNESS: Tara Davis is a 76 y.o. female originally seen in the multidisciplinary breast clinic for a new diagnosis of left breast cancer. The patient was noted to have a screening detected mass in the left breast, upper outer quadrant.  By ultrasound this measured 4 mm in greatest dimension in the 2 o'clock position correlating to the mammographic findings.  There is also focal asymmetry in the lower outer quadrant seen on mammogram that did not show any persistent finding.  Her left axilla was negative for adenopathy.  Biopsy on 02/11/2022 showed grade 2 invasive ductal carcinoma with associated intermediate grade DCIS.  Her cancer was ER/PR positive, HER2 negative with a Ki 67 of 20%.   Last visit, patient underwent left lumpectomy on 03/12/2022.  Final pathology showed grade 2 invasive ductal carcinoma that measured 5 mm in greatest dimension.  Her margins were all negative.  Additional margin excisions were all negative as well.  She has recovered well since surgery and also found out that she does not have any genetic increased risk of breast cancer from genetic testing.  She is seen today to discuss treatment recommendations for adjuvant radiotherapy.   PREVIOUS RADIATION THERAPY: No   PAST MEDICAL HISTORY:  Past Medical History:  Diagnosis Date   Adenomatous colon polyp    Anxiety    Arthritis    Bilateral cataracts    within 6 mos.   Depression    Diverticulosis    Fatty liver    GERD (gastroesophageal reflux disease)    H. pylori infection    Hyperlipidemia    Ulcer 1988    Uterine fibroid        PAST SURGICAL HISTORY: Past Surgical History:  Procedure Laterality Date   APPENDECTOMY     BREAST LUMPECTOMY WITH RADIOACTIVE SEED LOCALIZATION Left 03/12/2022   Procedure: LEFT BREAST LUMPECTOMY WITH RADIOACTIVE SEED LOCALIZATION;  Surgeon: Stark Klein, MD;  Location: [place];  Service: General;  Laterality: Left;   CATARACT EXTRACTION Bilateral    CATARACT EXTRACTION, BILATERAL     CHOLECYSTECTOMY     COLONOSCOPY     spontaneous vaginal delivery     x2   TONSILLECTOMY     TOTAL KNEE ARTHROPLASTY Left 10/11/2021   Procedure: LEFT TOTAL KNEE ARTHROPLASTY;  Surgeon: Newt Minion, MD;  Location: [place];  Service: Orthopedics;  Laterality: Left;   WISDOM TOOTH EXTRACTION       FAMILY HISTORY:  Family History  Problem Relation Age of Onset   Alzheimer's disease Mother    Depression Father    Diabetes Father    Breast cancer Sister 86   Colon cancer Neg Hx    Esophageal cancer Neg Hx    Stomach cancer Neg Hx      SOCIAL HISTORY:  reports that she has been smoking cigarettes. She has a 60.00 pack-year smoking history. She has never used smokeless tobacco. She reports current alcohol use of about 10.0 standard drinks of alcohol per week. She reports that she does not use drugs. The patient is single  but in a relationship. 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 Her daughter in law Rosine Beat has previously joined [place] she works in Brewing technologist.   ALLERGIES: Amoxicillin-pot clavulanate, Codeine, and Penicillins   MEDICATIONS:  Current Outpatient Medications  Medication Sig Dispense Refill   ARIPiprazole (ABILIFY) 2 MG tablet Take 2 mg by mouth daily.     atorvastatin (LIPITOR) 20 MG tablet TAKE ONE TABLET BY MOUTH DAILY 76 tablet 1   Bacillus Coagulans-Inulin (BENEFIBER PREBIOTIC+PROBIOTIC PO) Take 1 Scoop by mouth daily.     dicyclomine (BENTYL) 10 MG capsule Take 10 mg by mouth in the morning  and at bedtime.     loperamide (IMODIUM A-D) 2 MG tablet Take 2 mg by mouth 4 (four) times daily as needed for diarrhea or loose stools.     LORazepam (ATIVAN) 0.5 MG tablet Take 1 mg by mouth 2 (two) times daily.     nicotine (NICODERM CQ - DOSED IN MG/24 HOURS) 21 mg/24hr patch Place 21 mg onto the skin daily as needed (nicotine).     omeprazole (PRILOSEC) 40 MG capsule Take 40 mg by mouth daily.     ondansetron (ZOFRAN) 4 MG tablet Take 1 tablet (4 mg total) by mouth every 8 (eight) hours as needed for nausea. 8 tablet 5   ondansetron (ZOFRAN) 8 MG tablet Take 0.5 tablets (4 mg total) by mouth every 8 (eight) hours as needed for nausea or vomiting. 10 tablet 1   traMADol (ULTRAM) 50 MG tablet Take 1 tablet (50 mg total) by mouth every 6 (six) hours as needed for moderate pain or severe pain. 6 tablet 0   vortioxetine HBr (TRINTELLIX) 20 MG TABS tablet Take 20 mg by mouth daily.     zolpidem (AMBIEN) 10 MG tablet Take 10 mg by mouth at bedtime as needed for sleep.     No current facility-administered medications for this visit.     REVIEW OF SYSTEMS: On review of systems, the patient reports that she is doing well overall since surgery. No breast specific complaints are noted.      PHYSICAL EXAM:  Wt Readings from Last 3 Encounters:  03/12/22 196 lb 9.6 oz (89.2 kg)  03/05/22 196 lb 9.6 oz (89.2 kg)  02/19/22 195 lb 6.4 oz (88.6 kg)   Temp Readings from Last 3 Encounters:  03/12/22 97.7 F (36.5 C)  03/05/22 98.7 F (37.1 C) (Oral)  02/19/22 97.7 F (36.5 C) (Temporal)   BP Readings from Last 3 Encounters:  03/12/22 (!) 157/69  03/05/22 138/69  02/19/22 (!) 166/67   Pulse Readings from Last 3 Encounters:  03/12/22 70  03/05/22 87  02/19/22 93    In general this is a well appearing caucasian female in no acute distress. She's alert and oriented x4 and appropriate throughout the examination. Cardiopulmonary assessment is negative for acute distress and she exhibits normal  effort.  Her left breast has a well-healed incision site without erythema separation or drainage.    ECOG = 0  0 - Asymptomatic (Fully active, able to carry on all predisease activities without restriction)  1 - Symptomatic but completely ambulatory (Restricted in physically strenuous activity but ambulatory and able to carry out work of a light or sedentary nature. For example, light housework, office work)  2 - Symptomatic, <50% in bed during the day (Ambulatory and capable of all self care but unable to carry out any work activities. Up and about more than  50% of waking hours)  3 - Symptomatic, >50% in bed, but not bedbound (Capable of only limited self-care, confined to bed or chair 50% or more of waking hours)  4 - Bedbound (Completely disabled. Cannot carry on any self-care. Totally confined to bed or chair)  5 - Death   Eustace Pen MM, Creech RH, Tormey DC, et al. [phone]). "Toxicity and response criteria of the [facility]". [place] Oncol. 5 (6): 649-55    LABORATORY DATA:  Lab Results  Component Value Date   WBC 9.7 02/19/2022   HGB 14.7 02/19/2022   HCT 43.0 02/19/2022   MCV 95.6 02/19/2022   PLT 300 02/19/2022   Lab Results  Component Value Date   NA 139 02/19/2022   K 3.9 02/19/2022   CL 105 02/19/2022   CO2 27 02/19/2022   Lab Results  Component Value Date   ALT 25 02/19/2022   AST 21 02/19/2022   ALKPHOS 82 02/19/2022   BILITOT 0.4 02/19/2022      RADIOGRAPHY: No results found.     IMPRESSION/PLAN: 1. Stage IA, pT1a, cN0M0, grade 2 ER/PR positive, invasive ductal carcinoma of the left breast. Dr. Lisbeth Renshaw has reviewed her final pathology findings which he and I discussed, and today I reviewed the nature of left breast disease.  No systemic chemotherapy is planned given the small tumor site no Oncotype score was ordered.  We previously discussed the use of external radiotherapy to the breast  to reduce risks of local recurrence followed  by antiestrogen therapy as well as scenarios where radiation may be optional. We discussed the risks, benefits, short, and long term effects of radiotherapy, as well as the curative intent. At the conclusion of our discussion, she is comfortable forgoing radiation and now proceeding with antiestrogen therapy and surveillance with Dr. Burr Medico and Dr. Barry Dienes. We would be happy to see her back as needed moving forward.   In a visit lasting 45 minutes, greater than 50% of the time was spent face to face reviewing her case, as well as in preparation of, discussing, and coordinating the patient's care.    Carola Rhine, [facility]    **Disclaimer: This note was dictated with voice recognition software. Similar sounding words can inadvertently be transcribed and this note may contain transcription errors which may not have been corrected upon publication of note.**

## 2022-04-16 ENCOUNTER — Ambulatory Visit
Admission: RE | Admit: 2022-04-16 | Discharge: 2022-04-16 | Disposition: A | Discharge status: Home / Self Care | Payer: Medicare Other | Source: Ambulatory Visit | Attending: Radiation Oncology | Admitting: Radiation Oncology

## 2022-04-16 ENCOUNTER — Other Ambulatory Visit: Payer: Self-pay

## 2022-04-16 ENCOUNTER — Ambulatory Visit: Payer: Medicare Other | Admitting: Radiation Oncology

## 2022-04-16 ENCOUNTER — Encounter: Payer: Self-pay | Admitting: Radiation Oncology

## 2022-04-16 VITALS — BP 153/64 | HR 80 | Temp 97.7°F | Resp 18 | Ht 62.0 in | Wt 199.5 lb

## 2022-04-16 DIAGNOSIS — Z803 Family history of malignant neoplasm of breast: Secondary | ICD-10-CM | POA: Diagnosis not present

## 2022-04-16 DIAGNOSIS — Z86010 Personal history of colonic polyps: Secondary | ICD-10-CM | POA: Insufficient documentation

## 2022-04-16 DIAGNOSIS — Z800 Family history of malignant neoplasm of digestive organs: Secondary | ICD-10-CM | POA: Diagnosis not present

## 2022-04-16 DIAGNOSIS — C50412 Malignant neoplasm of upper-outer quadrant of left female breast: Secondary | ICD-10-CM

## 2022-04-16 DIAGNOSIS — Z79899 Other long term (current) drug therapy: Secondary | ICD-10-CM | POA: Diagnosis not present

## 2022-04-16 DIAGNOSIS — Z170 Estrogen receptor positive status [ER+]: Secondary | ICD-10-CM | POA: Insufficient documentation

## 2022-04-16 DIAGNOSIS — E785 Hyperlipidemia, unspecified: Secondary | ICD-10-CM | POA: Diagnosis not present

## 2022-04-16 DIAGNOSIS — K219 Gastro-esophageal reflux disease without esophagitis: Secondary | ICD-10-CM | POA: Diagnosis not present

## 2022-04-16 NOTE — Progress Notes (Signed)
Consult for Malignant neoplasm of upper-outer quadrant of left breast in female, estrogen receptor positive ([place]).  I verified patient's identity and began nursing interview. Patient states "She is doing well." No issues to report at this time.  Meaningful use complete. Postmenopausal  BP (!) 153/64 (BP Location: Right Arm, Patient Position: Sitting, Cuff Size: Large)   Pulse 80   Temp 97.7 F (36.5 C) (Oral)   Resp 18   Ht '5\' 2"'$  (1.575 m)   Wt 199 lb 8 oz (90.5 kg)   SpO2 97%   BMI 36.49 kg/m   This concludes this nursing interview.  Leandra Kern, LPN

## 2022-04-17 ENCOUNTER — Encounter: Payer: Self-pay | Admitting: *Deleted

## 2022-04-21 ENCOUNTER — Encounter: Payer: Self-pay | Admitting: Hematology

## 2022-04-21 ENCOUNTER — Inpatient Hospital Stay: Payer: Medicare Other | Attending: Hematology | Admitting: Hematology

## 2022-04-21 VITALS — BP 146/59 | HR 86 | Temp 98.4°F | Resp 18 | Ht 62.0 in | Wt 199.4 lb

## 2022-04-21 DIAGNOSIS — Z79899 Other long term (current) drug therapy: Secondary | ICD-10-CM | POA: Diagnosis not present

## 2022-04-21 DIAGNOSIS — Z79811 Long term (current) use of aromatase inhibitors: Secondary | ICD-10-CM | POA: Diagnosis not present

## 2022-04-21 DIAGNOSIS — C50412 Malignant neoplasm of upper-outer quadrant of left female breast: Secondary | ICD-10-CM | POA: Diagnosis not present

## 2022-04-21 DIAGNOSIS — Z170 Estrogen receptor positive status [ER+]: Secondary | ICD-10-CM | POA: Diagnosis not present

## 2022-04-21 MED ORDER — ANASTROZOLE 1 MG PO TABS: 1.0000 mg | ORAL_TABLET | Freq: Every day | ORAL | 5 refills | Status: DC

## 2022-04-21 NOTE — Progress Notes (Signed)
[facility]   Telephone:([phone] Fax:([phone]) [phone]   Clinic Follow up Note   Patient Care Team: Patient, No Pcp Per as PCP - General (General Practice) Pieter Partridge, DO as Consulting Physician (Neurology) Rockwell Germany, RN as Oncology Nurse Navigator Mauro Kaufmann, RN as Oncology Nurse Navigator Stark Klein, MD as Consulting Physician (General Surgery) Truitt Merle, MD as Consulting Physician (Hematology) Kyung Rudd, MD as Consulting Physician (Radiation Oncology) Percell Belt, MD as Consulting Physician (Gastroenterology)  Date of Service:  04/21/2022  CHIEF COMPLAINT: f/u of left breast cancer  CURRENT THERAPY:  To start anastrozole  ASSESSMENT & PLAN:  Tara Davis is a 76 y.o. post-menopausal female with   1. Malignant neoplasm of upper-outer quadrant of left breast, Stage IA, pT1a c(N0M0), ER+/PR+/HER2-, Grade 2 -found on screening mammogram. S/p left lumpectomy 03/12/22 by Dr. Barry Dienes, path showed 2m IDC and DCIS. -given small tumor size, she and Dr. MLisbeth Renshawagreed to forego radiation therapy. ---Given the strong ER and PR positivity, I recommend adjuvant antiestrogen therapy for 5 years to reduce her risk of cancer recurrence. We discussed the differences between aromatase inhibitors versus tamoxifen. The potential benefit and side effects of both include but not limited to hot flash, skin and vaginal dryness, metabolic changes ( increased blood glucose, cholesterol, weight, etc.), slightly in increased risk of cardiovascular disease, cataracts, muscular and joint discomfort. AI generally causes more joint pain than tamoxifen.  AI carries the risk of weakening her bones, while tamoxifen will strengthen. Tamoxifen carries the small risk of thrombosis and endometrial cancer. These were discussed with her in great details. I recommend she try anastrozole first.  2. Bone Health  -Her most recent DEXA on 01/30/20 was normal, T-score of  +0.3.   PLAN: -start anastrozole in next few weeks, I called in today  -survivorship in 3 months -lab and f/u in 6 months   No problem-specific Assessment & Plan notes found for this encounter.   SUMMARY OF ONCOLOGIC HISTORY: Oncology History  Malignant neoplasm of upper-outer quadrant of left breast in female, estrogen receptor positive ([facility]  02/11/2022 Initial Biopsy   Diagnosis Breast, left, needle core biopsy, 2 o'clock, 8cmfn - INVASIVE DUCTAL CARCINOMA, SEE NOTE - DUCTAL CARCINOMA IN SITU, INTERMEDIATE GRADE - TUBULE FORMATION: SCORE 3 - NUCLEAR PLEOMORPHISM: SCORE 2 - MITOTIC COUNT: SCORE 1 - TOTAL SCORE: 6 - OVERALL GRADE: 2 - LYMPHOVASCULAR INVASION: NOT IDENTIFIED - CANCER LENGTH: 0.3 CM - CALCIFICATIONS: NOT IDENTIFIED - OTHER FINDINGS: NONE   02/11/2022 Cancer Staging   Staging form: Breast, AJCC 8th Edition - Clinical stage from 02/11/2022: Stage IA (cT1a, cN0, cM0, G2, ER+, PR+, HER2-) - Signed by FTruitt Merle MD on 02/23/2022 Stage prefix: Initial diagnosis Method of lymph node assessment: Clinical Histologic grading system: 3 grade system   02/17/2022 Initial Diagnosis   Malignant neoplasm of upper-outer quadrant of left breast in female, estrogen receptor positive ([facility]    Genetic Testing   [facility] was Negative. Report date is 03/01/2022.  The CancerNext gene panel offered by [employer] Homesincludes sequencing, rearrangement analysis, and RNA analysis for the following 36 genes:   APC, ATM, AXIN2, BARD1, BMPR1A, BRCA1, BRCA2, BRIP1, CDH1, CDK4, CDKN2A, CHEK2, DICER1, HOXB13, EPCAM, GREM1, MLH1, MSH2, MSH3, MSH6, MUTYH, NBN, NF1, NTHL1, PALB2, PMS2, POLD1, POLE, PTEN, RAD51C, RAD51D, RECQL, SMAD4, SMARCA4, STK11, and TP53.    03/12/2022 Cancer Staging   Staging form: Breast, AJCC 8th Edition - Pathologic stage from 03/12/2022: Stage Unknown (pT1a, pNX,  cM0, G2, ER+, PR+, HER2-) - Signed by Truitt Merle, MD on 04/21/2022 Stage prefix: Initial  diagnosis Histologic grading system: 3 grade system Residual tumor (R): R0 - None      INTERVAL HISTORY:  Tara Davis is here for a follow up of breast cancer. She was last seen by me on 02/19/22 in consultation. She presents to the clinic alone. She reports she did very well with surgery and has recovered with no residual side effects.   All other systems were reviewed with the patient and are negative.  MEDICAL HISTORY:  Past Medical History:  Diagnosis Date   Adenomatous colon polyp    Anxiety    Arthritis    Bilateral cataracts    within 6 mos.   Depression    Diverticulosis    Fatty liver    GERD (gastroesophageal reflux disease)    H. pylori infection    Hyperlipidemia    Ulcer 1988   Uterine fibroid     SURGICAL HISTORY: Past Surgical History:  Procedure Laterality Date   APPENDECTOMY     BREAST LUMPECTOMY WITH RADIOACTIVE SEED LOCALIZATION Left 03/12/2022   Procedure: LEFT BREAST LUMPECTOMY WITH RADIOACTIVE SEED LOCALIZATION;  Surgeon: Stark Klein, MD;  Location: [place];  Service: General;  Laterality: Left;   CATARACT EXTRACTION Bilateral    CATARACT EXTRACTION, BILATERAL     CHOLECYSTECTOMY     COLONOSCOPY     spontaneous vaginal delivery     x2   TONSILLECTOMY     TOTAL KNEE ARTHROPLASTY Left 10/11/2021   Procedure: LEFT TOTAL KNEE ARTHROPLASTY;  Surgeon: Newt Minion, MD;  Location: [facility];  Service: Orthopedics;  Laterality: Left;   WISDOM TOOTH EXTRACTION      I have reviewed the social history and family history with the patient and they are unchanged from previous note.  ALLERGIES:  is allergic to amoxicillin-pot clavulanate, codeine, and penicillins.  MEDICATIONS:  Current Outpatient Medications  Medication Sig Dispense Refill   anastrozole (ARIMIDEX) 1 MG tablet Take 1 tablet (1 mg total) by mouth daily. 30 tablet 5   ARIPiprazole (ABILIFY) 2 MG tablet Take 2 mg by mouth daily.     atorvastatin (LIPITOR) 20 MG tablet TAKE ONE TABLET BY  MOUTH DAILY 76 tablet 1   Bacillus Coagulans-Inulin (BENEFIBER PREBIOTIC+PROBIOTIC PO) Take 1 Scoop by mouth daily.     dicyclomine (BENTYL) 10 MG capsule Take 10 mg by mouth in the morning and at bedtime.     loperamide (IMODIUM A-D) 2 MG tablet Take 2 mg by mouth 4 (four) times daily as needed for diarrhea or loose stools.     LORazepam (ATIVAN) 0.5 MG tablet Take 1 mg by mouth 2 (two) times daily.     nicotine (NICODERM CQ - DOSED IN MG/24 HOURS) 21 mg/24hr patch Place 21 mg onto the skin daily as needed (nicotine).     omeprazole (PRILOSEC) 40 MG capsule Take 40 mg by mouth daily.     ondansetron (ZOFRAN) 4 MG tablet Take 1 tablet (4 mg total) by mouth every 8 (eight) hours as needed for nausea. 8 tablet 5   vortioxetine HBr (TRINTELLIX) 20 MG TABS tablet Take 20 mg by mouth daily.     zolpidem (AMBIEN) 10 MG tablet Take 10 mg by mouth at bedtime as needed for sleep.     No current facility-administered medications for this visit.    PHYSICAL EXAMINATION: ECOG PERFORMANCE STATUS: 0 - Asymptomatic  Vitals:   04/21/22 1359  BP: Marland Kitchen)  146/59  Pulse: 86  Resp: 18  Temp: 98.4 F (36.9 C)  SpO2: 96%   Wt Readings from Last 3 Encounters:  04/21/22 199 lb 6.4 oz (90.4 kg)  04/16/22 199 lb 8 oz (90.5 kg)  03/12/22 196 lb 9.6 oz (89.2 kg)     GENERAL:alert, no distress and comfortable SKIN: skin color normal, no rashes or significant lesions EYES: normal, Conjunctiva are pink and non-injected, sclera clear  NEURO: alert & oriented x 3 with fluent speech  LABORATORY DATA:  I have reviewed the data as listed    Latest Ref Rng & Units 02/19/2022   12:15 PM 10/03/2021    3:51 PM 12/07/2018    9:22 AM  CBC  WBC 4.0 - 10.5 K/uL 9.7  9.8  9.7   Hemoglobin 12.0 - 15.0 g/dL 14.7  13.7  14.5   Hematocrit 36.0 - 46.0 % 43.0  38.2  41.9   Platelets 150 - 400 K/uL 300  288  299         Latest Ref Rng & Units 02/19/2022   12:15 PM 10/03/2021    3:51 PM 12/07/2018    9:22 AM  CMP   Glucose 70 - 99 mg/dL 115  99  124   BUN 8 - 23 mg/dL _0 Creatinine 0.44 - 1.00 mg/dL 1.03  0.93  0.91   Sodium 135 - 145 mmol/L 139  144  142   Potassium 3.5 - 5.1 mmol/L 3.9  4.1  4.5   Chloride 98 - 111 mmol/L 105  106  102   CO2 22 - 32 mmol/L _1 Calcium 8.9 - 10.3 mg/dL 9.9  9.2  9.0   Total Protein 6.5 - 8.1 g/dL 7.3  6.5  6.7   Total Bilirubin 0.3 - 1.2 mg/dL 0.4  <0.2  0.3   Alkaline Phos 38 - 126 U/L 82  124  70   AST 15 - 41 U/L _2 ALT 0 - 44 U/L 25  58  30       RADIOGRAPHIC STUDIES: I have personally reviewed the radiological images as listed and agreed with the findings in the report. No results found.    No orders of the defined types were placed in this encounter.  All questions were answered. The patient knows to call the clinic with any problems, questions or concerns. No barriers to learning was detected. The total time spent in the appointment was 30 minutes.     Truitt Merle, MD 04/21/2022   I, Wilburn Mylar, am acting as scribe for Truitt Merle, MD.   I have reviewed the above documentation for accuracy and completeness, and I agree with the above.

## 2022-04-24 ENCOUNTER — Encounter: Payer: Self-pay | Admitting: *Deleted

## 2022-04-24 DIAGNOSIS — Z170 Estrogen receptor positive status [ER+]: Secondary | ICD-10-CM

## 2022-04-25 DIAGNOSIS — C50919 Malignant neoplasm of unspecified site of unspecified female breast: Secondary | ICD-10-CM | POA: Diagnosis not present

## 2022-04-25 DIAGNOSIS — Z86010 Personal history of colonic polyps: Secondary | ICD-10-CM | POA: Diagnosis not present

## 2022-04-25 DIAGNOSIS — I700 Atherosclerosis of aorta: Secondary | ICD-10-CM | POA: Diagnosis not present

## 2022-05-05 ENCOUNTER — Telehealth: Payer: Self-pay | Admitting: Orthopedic Surgery

## 2022-05-05 NOTE — Telephone Encounter (Signed)
She needs to make an appt to f/u with Dr. Sharol Given for further exams. Please call to schedule. Thank you!

## 2022-05-05 NOTE — Telephone Encounter (Signed)
Patient would like to talk to Dr Sharol Given about her lack of improvement on the stairs. [phone]

## 2022-05-09 DIAGNOSIS — F331 Major depressive disorder, recurrent, moderate: Secondary | ICD-10-CM | POA: Diagnosis not present

## 2022-05-19 ENCOUNTER — Ambulatory Visit: Payer: Medicare Other | Admitting: Orthopedic Surgery

## 2022-05-19 ENCOUNTER — Ambulatory Visit (INDEPENDENT_AMBULATORY_CARE_PROVIDER_SITE_OTHER): Payer: Medicare Other

## 2022-05-19 ENCOUNTER — Encounter: Payer: Self-pay | Admitting: Orthopedic Surgery

## 2022-05-19 DIAGNOSIS — Z96652 Presence of left artificial knee joint: Secondary | ICD-10-CM | POA: Diagnosis not present

## 2022-05-19 NOTE — Progress Notes (Signed)
Office Visit Note   Patient: Tara Davis           Date of Birth: [birth year]           MRN: [id number] Visit Date: 05/19/2022              Requested by: Sueanne Margarita, [address] [place],  [place] [address] PCP: Sueanne Margarita, DO  Chief Complaint  Patient presents with   Left Knee - Follow-up    10/11/2021 left total knee replacement      HPI: Patient is a 75 year old woman who is 7 months status post left total knee arthroplasty.  Patient states she only has pain when she goes upstairs.  She is doing a lot of walking.  Assessment & Plan: Visit Diagnoses:  1. Status post total left knee replacement     Plan: Patient was demonstrated exercises for quad strengthening.  Discussed the importance of daily quad strengthening exercises.  Follow-Up Instructions: Return if symptoms worsen or fail to improve.   Ortho Exam  Patient is alert, oriented, no adenopathy, well-dressed, normal affect, normal respiratory effort. Examination patient has no effusion collateral ligaments are stable she has full extension and good flexion.  Imaging: XR Knee 1-2 Views Left  Result Date: 05/19/2022 2 view radiographs of the left knee shows a stable total knee arthroplasty.  The patella is midline there is no loosening around the implants.  No varus or valgus malalignment.  No images are attached to the encounter.  Labs: Lab Results  Component Value Date   HGBA1C 6.2 (H) 10/03/2021   HGBA1C 5.9 (H) 04/13/2018   LABORGA Multiple bacterial morphotypes present, none 12/17/2014   LABORGA predominant. Suggest appropriate recollection if 12/17/2014   LABORGA clinically indicated. 12/17/2014     Lab Results  Component Value Date   ALBUMIN 4.6 02/19/2022   ALBUMIN 4.4 10/03/2021   ALBUMIN 4.5 12/07/2018    Lab Results  Component Value Date   MG 2.1 01/07/2008   Lab Results  Component Value Date   VD25OH 31.2 04/13/2018    No results found for: "PREALBUMIN"    Latest Ref Rng  & Units 02/19/2022   12:15 PM 10/03/2021    3:51 PM 12/07/2018    9:22 AM  CBC EXTENDED  WBC 4.0 - 10.5 K/uL 9.7  9.8  9.7   RBC 3.87 - 5.11 MIL/uL 4.50  3.93  4.34   Hemoglobin 12.0 - 15.0 g/dL 14.7  13.7  14.5   HCT 36.0 - 46.0 % 43.0  38.2  41.9   Platelets 150 - 400 K/uL 300  288  299   NEUT# 1.7 - 7.7 K/uL 6.4   6.8   Lymph# 0.7 - 4.0 K/uL 2.7   2.1      There is no height or weight on file to calculate BMI.  Orders:  Orders Placed This Encounter  Procedures   XR Knee 1-2 Views Left   No orders of the defined types were placed in this encounter.    Procedures: No procedures performed  Clinical Data: No additional findings.  ROS:  All other systems negative, except as noted in the HPI. Review of Systems  Objective: Vital Signs: There were no vitals taken for this visit.  Specialty Comments:  No specialty comments available.  PMFS History: Patient Active Problem List   Diagnosis Date Noted   Genetic testing 03/04/2022   Family history of breast cancer 02/19/2022   Malignant neoplasm of upper-outer quadrant of left  breast in female, estrogen receptor positive ([place]) 02/17/2022   Total knee replacement status, left 10/11/2021   Unilateral primary osteoarthritis, left knee    Personal history of colonic polyps 08/07/2020   Gastroesophageal reflux disease 08/23/2019   Tobacco abuse 01/12/2018   Cigarette smoker 01/12/2018   IBS 07/05/2008   Past Medical History:  Diagnosis Date   Adenomatous colon polyp    Anxiety    Arthritis    Bilateral cataracts    within 6 mos.   Depression    Diverticulosis    Fatty liver    GERD (gastroesophageal reflux disease)    H. pylori infection    Hyperlipidemia    Ulcer 1988   Uterine fibroid     Family History  Problem Relation Age of Onset   Alzheimer's disease Mother    Depression Father    Diabetes Father    Breast cancer Sister 45   Colon cancer Neg Hx    Esophageal cancer Neg Hx    Stomach cancer Neg Hx      Past Surgical History:  Procedure Laterality Date   APPENDECTOMY     BREAST LUMPECTOMY WITH RADIOACTIVE SEED LOCALIZATION Left 03/12/2022   Procedure: LEFT BREAST LUMPECTOMY WITH RADIOACTIVE SEED LOCALIZATION;  Surgeon: Stark Klein, MD;  Location: [place];  Service: General;  Laterality: Left;   CATARACT EXTRACTION Bilateral    CATARACT EXTRACTION, BILATERAL     CHOLECYSTECTOMY     COLONOSCOPY     spontaneous vaginal delivery     x2   TONSILLECTOMY     TOTAL KNEE ARTHROPLASTY Left 10/11/2021   Procedure: LEFT TOTAL KNEE ARTHROPLASTY;  Surgeon: Newt Minion, MD;  Location: [facility];  Service: Orthopedics;  Laterality: Left;   WISDOM TOOTH EXTRACTION     Social History   Occupational History   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

## 2022-06-12 DIAGNOSIS — K080 Exfoliation of teeth due to systemic causes: Secondary | ICD-10-CM | POA: Diagnosis not present

## 2022-06-17 DIAGNOSIS — K080 Exfoliation of teeth due to systemic causes: Secondary | ICD-10-CM | POA: Diagnosis not present

## 2022-06-23 DIAGNOSIS — K080 Exfoliation of teeth due to systemic causes: Secondary | ICD-10-CM | POA: Diagnosis not present

## 2022-07-21 ENCOUNTER — Encounter: Payer: Self-pay | Admitting: *Deleted

## 2022-07-21 ENCOUNTER — Encounter: Payer: Self-pay | Admitting: Adult Health

## 2022-07-21 ENCOUNTER — Inpatient Hospital Stay: Payer: Medicare Other | Attending: Hematology | Admitting: Adult Health

## 2022-07-21 VITALS — BP 147/64 | HR 93 | Temp 97.9°F | Resp 18 | Ht 62.0 in | Wt 206.6 lb

## 2022-07-21 DIAGNOSIS — E2839 Other primary ovarian failure: Secondary | ICD-10-CM

## 2022-07-21 DIAGNOSIS — Z720 Tobacco use: Secondary | ICD-10-CM

## 2022-07-21 DIAGNOSIS — Z6836 Body mass index (BMI) 36.0-36.9, adult: Secondary | ICD-10-CM | POA: Insufficient documentation

## 2022-07-21 DIAGNOSIS — Z803 Family history of malignant neoplasm of breast: Secondary | ICD-10-CM | POA: Diagnosis not present

## 2022-07-21 DIAGNOSIS — F321 Major depressive disorder, single episode, moderate: Secondary | ICD-10-CM | POA: Insufficient documentation

## 2022-07-21 DIAGNOSIS — Z170 Estrogen receptor positive status [ER+]: Secondary | ICD-10-CM | POA: Diagnosis not present

## 2022-07-21 DIAGNOSIS — F17210 Nicotine dependence, cigarettes, uncomplicated: Secondary | ICD-10-CM | POA: Insufficient documentation

## 2022-07-21 DIAGNOSIS — C50412 Malignant neoplasm of upper-outer quadrant of left female breast: Secondary | ICD-10-CM | POA: Diagnosis not present

## 2022-07-21 DIAGNOSIS — Z79811 Long term (current) use of aromatase inhibitors: Secondary | ICD-10-CM | POA: Insufficient documentation

## 2022-07-21 DIAGNOSIS — Z79899 Other long term (current) drug therapy: Secondary | ICD-10-CM | POA: Insufficient documentation

## 2022-07-21 DIAGNOSIS — I700 Atherosclerosis of aorta: Secondary | ICD-10-CM | POA: Insufficient documentation

## 2022-07-21 NOTE — Progress Notes (Signed)
SURVIVORSHIP VISIT:   BRIEF ONCOLOGIC HISTORY:  Oncology History  Malignant neoplasm of upper-outer quadrant of left breast in female, estrogen receptor positive ([place])  02/11/2022 Initial Biopsy   Diagnosis Breast, left, needle core biopsy, 2 o'clock, 8cmfn - INVASIVE DUCTAL CARCINOMA, SEE NOTE - DUCTAL CARCINOMA IN SITU, INTERMEDIATE GRADE - TUBULE FORMATION: SCORE 3 - NUCLEAR PLEOMORPHISM: SCORE 2 - MITOTIC COUNT: SCORE 1 - TOTAL SCORE: 6 - OVERALL GRADE: 2 - LYMPHOVASCULAR INVASION: NOT IDENTIFIED - CANCER LENGTH: 0.3 CM - CALCIFICATIONS: NOT IDENTIFIED - OTHER FINDINGS: NONE   02/11/2022 Cancer Staging   Staging form: Breast, AJCC 8th Edition - Clinical stage from 02/11/2022: Stage IA (cT1a, cN0, cM0, G2, ER+, PR+, HER2-) - Signed by Truitt Merle, MD on 02/23/2022 Stage prefix: Initial diagnosis Method of lymph node assessment: Clinical Histologic grading system: 3 grade system   02/17/2022 Initial Diagnosis   Malignant neoplasm of upper-outer quadrant of left breast in female, estrogen receptor positive ([place])    Genetic Testing   [facility] was Negative. Report date is 03/01/2022.  The CancerNext gene panel offered by [employer] includes sequencing, rearrangement analysis, and RNA analysis for the following 36 genes:   APC, ATM, AXIN2, BARD1, BMPR1A, BRCA1, BRCA2, BRIP1, CDH1, CDK4, CDKN2A, CHEK2, DICER1, HOXB13, EPCAM, GREM1, MLH1, MSH2, MSH3, MSH6, MUTYH, NBN, NF1, NTHL1, PALB2, PMS2, POLD1, POLE, PTEN, RAD51C, RAD51D, RECQL, SMAD4, SMARCA4, STK11, and TP53.    03/12/2022 Cancer Staging   Staging form: Breast, AJCC 8th Edition - Pathologic stage from 03/12/2022: Stage Unknown (pT1a, pNX, cM0, G2, ER+, PR+, HER2-) - Signed by Truitt Merle, MD on 04/21/2022 Stage prefix: Initial diagnosis Histologic grading system: 3 grade system Residual tumor (R): R0 - None   04/2022 -  Anti-estrogen oral therapy   Anastrozole     INTERVAL HISTORY:  Tara Davis to review her  survivorship care plan detailing her treatment course for breast cancer, as well as monitoring long-term side effects of that treatment, education regarding health maintenance, screening, and overall wellness and health promotion.     Overall, Tara Davis reports feeling quite well.  She is taking Anastrozole daily and is tolerating it well.  She denies any significant issues including hot flashes, arthralgias, or vaginal dryness.  She reviewed with me that she is a current everyday smoker of 1ppd x 60 years.  She is not yet ready to quit.   REVIEW OF SYSTEMS:  Review of Systems  Constitutional:  Negative for appetite change, chills, fatigue, fever and unexpected weight change.  HENT:   Negative for hearing loss, lump/mass and trouble swallowing.   Eyes:  Negative for eye problems and icterus.  Respiratory:  Negative for chest tightness, cough and shortness of breath.   Cardiovascular:  Negative for chest pain, leg swelling and palpitations.  Gastrointestinal:  Negative for abdominal distention, abdominal pain, constipation, diarrhea, nausea and vomiting.  Endocrine: Negative for hot flashes.  Genitourinary:  Negative for difficulty urinating.   Musculoskeletal:  Negative for arthralgias.  Skin:  Negative for itching and rash.  Neurological:  Negative for dizziness, extremity weakness, headaches and numbness.  Hematological:  Negative for adenopathy. Does not bruise/bleed easily.  Psychiatric/Behavioral:  Negative for depression. [name] is not nervous/anxious.   Breast: Denies any new nodularity, masses, tenderness, nipple changes, or nipple discharge.     PAST MEDICAL/SURGICAL HISTORY:  Past Medical History:  Diagnosis Date   Adenomatous colon polyp    Anxiety    Arthritis    Bilateral cataracts  within 6 mos.   Depression    Diverticulosis    Fatty liver    GERD (gastroesophageal reflux disease)    H. pylori infection    Hyperlipidemia    Ulcer 1988   Uterine fibroid     Past Surgical History:  Procedure Laterality Date   APPENDECTOMY     BREAST LUMPECTOMY WITH RADIOACTIVE SEED LOCALIZATION Left 03/12/2022   Procedure: LEFT BREAST LUMPECTOMY WITH RADIOACTIVE SEED LOCALIZATION;  Surgeon: Stark Klein, MD;  Location: [facility];  Service: General;  Laterality: Left;   CATARACT EXTRACTION Bilateral    CATARACT EXTRACTION, BILATERAL     CHOLECYSTECTOMY     COLONOSCOPY     spontaneous vaginal delivery     x2   TONSILLECTOMY     TOTAL KNEE ARTHROPLASTY Left 10/11/2021   Procedure: LEFT TOTAL KNEE ARTHROPLASTY;  Surgeon: Newt Minion, MD;  Location: [facility];  Service: Orthopedics;  Laterality: Left;   WISDOM TOOTH EXTRACTION       ALLERGIES:  Allergies  Allergen Reactions   Amoxicillin-Pot Clavulanate Itching   Codeine Other (See Comments)    REACTION: hallucinations   Penicillins Itching     CURRENT MEDICATIONS:  Outpatient Encounter Medications as of 07/21/2022  Medication Sig Note   anastrozole (ARIMIDEX) 1 MG tablet Take 1 tablet (1 mg total) by mouth daily.    ARIPiprazole (ABILIFY) 2 MG tablet Take 2 mg by mouth daily.    atorvastatin (LIPITOR) 20 MG tablet TAKE ONE TABLET BY MOUTH DAILY    Bacillus Coagulans-Inulin (BENEFIBER PREBIOTIC+PROBIOTIC PO) Take 1 Scoop by mouth daily.    dicyclomine (BENTYL) 10 MG capsule Take 10 mg by mouth in the morning and at bedtime.    loperamide (IMODIUM A-D) 2 MG tablet Take 2 mg by mouth 4 (four) times daily as needed for diarrhea or loose stools.    LORazepam (ATIVAN) 0.5 MG tablet Take 1 mg by mouth 2 (two) times daily.    nicotine (NICODERM CQ - DOSED IN MG/24 HOURS) 21 mg/24hr patch Place 21 mg onto the skin daily as needed (nicotine). 03/03/2022: Used when traveling   omeprazole (PRILOSEC) 40 MG capsule Take 40 mg by mouth daily.    ondansetron (ZOFRAN) 4 MG tablet Take 1 tablet (4 mg total) by mouth every 8 (eight) hours as needed for nausea.    vortioxetine HBr (TRINTELLIX) 20 MG TABS tablet Take 20 mg  by mouth daily.    zolpidem (AMBIEN) 10 MG tablet Take 10 mg by mouth at bedtime as needed for sleep.    No facility-administered encounter medications on file as of 07/21/2022.     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 She lives with her long time boyfriend of 25+ years ina 2 level home. She drinks green tea, 2-4 cups a day. She does not exercise.   Social Determinants of Health   Financial Resource Strain: Low Risk  (06/14/2020)   Overall Financial Resource Strain (CARDIA)    Difficulty of Paying Living Expenses: Not hard at all  Food Insecurity: No Food Insecurity (06/14/2020)   Hunger Vital Sign    Worried About Running Out of Food in the Last Year: Never true    Ran Out of Food in the Last Year: Never true  Transportation Needs: No Transportation Needs (06/14/2020)   PRAPARE - Hydrologist (Medical): No    Lack of Transportation (Non-Medical): No  Physical  Activity: Inactive (06/14/2020)   Exercise Vital Sign    Days of Exercise per Week: 0 days    Minutes of Exercise per Session: 0 min  Stress: No Stress Concern Present (06/14/2020)   [place]    Feeling of Stress : Not at all  Social Connections: Not on file  Intimate Partner Violence: Not on file     OBSERVATIONS/OBJECTIVE:  BP (!) 147/64 (BP Location: Left Arm, Tara Position: Sitting)   Pulse 93   Temp 97.9 F (36.6 C) (Tympanic)   Resp 18   Ht 5' 2"$  (1.575 m)   Wt 206 lb 9.6 oz (93.7 kg)   SpO2 100%   BMI 37.79 kg/m  GENERAL: Tara is a well appearing female in no acute distress HEENT:  Sclerae anicteric.  Oropharynx clear and moist. No ulcerations or evidence of oropharyngeal candidiasis. Neck is supple.  NODES:  No cervical, supraclavicular, or axillary lymphadenopathy palpated.  BREAST EXAM:  right breast benign, left breast s/p lumpectomy, no sign of local recurrence LUNGS:  Clear to auscultation bilaterally.  No wheezes or rhonchi. HEART:  Regular rate and rhythm. No murmur appreciated. ABDOMEN:  Soft, nontender.  Positive, normoactive bowel sounds. No organomegaly palpated. MSK:  No focal spinal tenderness to palpation. Full range of motion bilaterally in the upper extremities. EXTREMITIES:  No peripheral edema.   SKIN:  Clear with no obvious rashes or skin changes. No nail dyscrasia. NEURO:  Nonfocal. Well oriented.  Appropriate affect.   LABORATORY DATA:  None for this visit.  DIAGNOSTIC IMAGING:  None for this visit.      ASSESSMENT AND PLAN:  Ms.. Davis is a pleasant 77 y.o. female with Stage IA left breast invasive ductal carcinoma, ER+/PR+/HER2-, diagnosed in 02/2022, treated with lumpectomy, and anti-estrogen therapy with Anastrozole beginning in 04/2022.  She presents to the Survivorship Clinic for our initial meeting and routine follow-up post-completion of treatment for breast cancer.     1. Stage IA left breast cancer:  Tara Davis is continuing to recover from definitive treatment for breast cancer. She will follow-up with her medical oncologist, Dr. Burr Medico in 10/2022 with history and physical exam per surveillance protocol.  She will continue her anti-estrogen therapy with Anastrozole. Thus far, she is tolerating the Anastrozole well,  with minimal side effects. She was instructed to make Dr. Lindi Adie or myself aware if she begins to experience any worsening side effects of the medication and I could see her back in clinic to help manage those side effects, as needed. Her mammogram is due 01/2023; orders placed today. Today, a comprehensive survivorship care plan and treatment summary was reviewed with [name] today detailing her breast cancer diagnosis, treatment course, potential late/long-term effects of treatment, appropriate follow-up care with recommendations for the future, and Tara education resources.  A copy of this summary, along with a letter will be sent to [name]'s primary care provider via mail/fax/In Basket message after today's visit.    2. Bone health:  Given Tara Davis's age/history of breast cancer and her current treatment regimen including anti-estrogen therapy with Anastrozole, she is at risk for bone demineralization.  Her last DEXA scan was in 2021 and was normal.  I ordered repeat testing to occur when she undergoes her mammogram.  She was given education on specific activities to promote bone health.  3. Cancer screening:  Due to Tara Davis's history and her age, she should receive screening for skin cancers, colon cancer, lung cancer, and gynecologic cancers.  The information and recommendations are listed on [name]'s comprehensive care plan/treatment summary and were reviewed in detail with [name].  In reviewing her records in further detail, I discovered that Tara Davis may be overdue for colon cancer screening.  I asked the survivorship navigator  Natro to f/u with her about this.    4. lung cancer screening: I discussed screening LOw DOse Ct chest without contrast for early detection of lung cancer in this Counseling and Shared Decision-Making Visit Tara Davis with [birth year] and Age 54 y.o. years met the following criteria and I counseled that in  A) age 63-75 AND B) smoking history of 20 pack year smoking AND C) Current smoker or one who has quit smoking within the last 15 years I counseled - Annual low dose CT chest can pick up lung cancer early and has potential to save lives and cure lung cancer - This is similar in concept to screening mammogram, colonoscopies and pap smears - I explained Ct scan chest is low dose radiation CT chest - I explained early lung cancer asymptomatic and only way to  detect is CT  With the real advantage that early lung cancer is curable through radiation or surgery - I explained CT superior to CXR - I explained that false positives are present and can incur cost and workup like biopsies, additional scan but benefit outweighs risk - I counseled that Tara should QUIT SMOKING - I counseled that Tara should adhered to protocol requirements of scan and followup scans  5. Health maintenance and wellness promotion: Tara Davis was encouraged to consume 5-7 servings of fruits and vegetables per day. We reviewed the "Nutrition Rainbow" handout.  She was also encouraged to engage in moderate to vigorous exercise for 30 minutes per day most days of the week. She was instructed to limit her alcohol consumption and was encouraged stop smoking.     6. Support services/counseling: It is not uncommon for this period of [name]'s cancer care trajectory to be one of many emotions and stressors.  She was given information regarding our available services and encouraged to contact me with any questions or for help enrolling in any of our support group/programs.    Follow up instructions:    -Return to  cancer center 10/2022 for f/u with Dr. Burr Medico  -Mammogram due in 01/2023 -Bone density testing 01/2023 -Lung cancer screening -She is welcome to return back to the Survivorship Clinic at any time; no additional follow-up needed at this time.  -Consider referral back to survivorship as a long-term survivor for continued surveillance  [name] was provided an opportunity to ask questions and all were answered. [name] agreed with the plan and demonstrated an understanding of the instructions.   Total encounter time:45 minutes*in face-to-face visit time, chart review, lab review, care coordination, order entry, and documentation of the encounter time.   Tara Bihari, NP 07/21/22 12:07 PM Medical Oncology and Hematology [facility] [place], [place] [address] Tel. [phone]    Fax. [phone]  *Total Encounter Time as defined by the Centers for Medicare and Medicaid Services includes, in addition to the face-to-face time of a Tara visit (documented in the note above) non-face-to-face time: obtaining and reviewing outside history, ordering and reviewing medications, tests or procedures, care coordination (communications with other health care professionals or caregivers) and documentation in the medical record.

## 2022-07-21 NOTE — Progress Notes (Signed)
Faxed MM/bone density orders, demographic info, and insurance card to [facility] [facility] with receipt confirmation.

## 2022-07-22 ENCOUNTER — Encounter: Payer: Self-pay | Admitting: *Deleted

## 2022-07-22 ENCOUNTER — Telehealth: Payer: Self-pay

## 2022-07-22 NOTE — Telephone Encounter (Signed)
Returned Pts call regarding CT scan. Pt asking where to go for CT. Relayed to Pt that she will go to main entrance of [facility]. Pt verbalized understanding.

## 2022-07-22 NOTE — Telephone Encounter (Signed)
Called and LVM regarding CT. Pt scheduled for 2/27 at 2:15 pm. Gave central scheduling number if she needs to reschedule and gave call back number with any questions.

## 2022-07-27 DIAGNOSIS — J209 Acute bronchitis, unspecified: Secondary | ICD-10-CM | POA: Diagnosis not present

## 2022-07-27 DIAGNOSIS — Z20822 Contact with and (suspected) exposure to covid-19: Secondary | ICD-10-CM | POA: Diagnosis not present

## 2022-07-27 DIAGNOSIS — R059 Cough, unspecified: Secondary | ICD-10-CM | POA: Diagnosis not present

## 2022-07-28 ENCOUNTER — Encounter: Payer: Self-pay | Admitting: *Deleted

## 2022-07-31 DIAGNOSIS — R0781 Pleurodynia: Secondary | ICD-10-CM | POA: Diagnosis not present

## 2022-07-31 DIAGNOSIS — R0602 Shortness of breath: Secondary | ICD-10-CM | POA: Diagnosis not present

## 2022-07-31 DIAGNOSIS — J189 Pneumonia, unspecified organism: Secondary | ICD-10-CM | POA: Diagnosis not present

## 2022-07-31 DIAGNOSIS — R030 Elevated blood-pressure reading, without diagnosis of hypertension: Secondary | ICD-10-CM | POA: Diagnosis not present

## 2022-07-31 DIAGNOSIS — R062 Wheezing: Secondary | ICD-10-CM | POA: Diagnosis not present

## 2022-08-05 ENCOUNTER — Ambulatory Visit (HOSPITAL_COMMUNITY): Payer: Medicare Other

## 2022-08-05 DIAGNOSIS — J209 Acute bronchitis, unspecified: Secondary | ICD-10-CM | POA: Diagnosis not present

## 2022-08-05 DIAGNOSIS — R051 Acute cough: Secondary | ICD-10-CM | POA: Diagnosis not present

## 2022-08-05 DIAGNOSIS — R197 Diarrhea, unspecified: Secondary | ICD-10-CM | POA: Diagnosis not present

## 2022-08-06 ENCOUNTER — Ambulatory Visit (HOSPITAL_COMMUNITY): Payer: Medicare Other

## 2022-08-13 DIAGNOSIS — R197 Diarrhea, unspecified: Secondary | ICD-10-CM | POA: Diagnosis not present

## 2022-08-18 DIAGNOSIS — E785 Hyperlipidemia, unspecified: Secondary | ICD-10-CM | POA: Diagnosis not present

## 2022-08-18 DIAGNOSIS — E1169 Type 2 diabetes mellitus with other specified complication: Secondary | ICD-10-CM | POA: Diagnosis not present

## 2022-08-18 DIAGNOSIS — Z780 Asymptomatic menopausal state: Secondary | ICD-10-CM | POA: Diagnosis not present

## 2022-08-18 DIAGNOSIS — Z79899 Other long term (current) drug therapy: Secondary | ICD-10-CM | POA: Diagnosis not present

## 2022-08-21 DIAGNOSIS — Z86010 Personal history of colonic polyps: Secondary | ICD-10-CM | POA: Diagnosis not present

## 2022-08-21 DIAGNOSIS — K529 Noninfective gastroenteritis and colitis, unspecified: Secondary | ICD-10-CM | POA: Diagnosis not present

## 2022-08-21 DIAGNOSIS — I700 Atherosclerosis of aorta: Secondary | ICD-10-CM | POA: Diagnosis not present

## 2022-08-21 DIAGNOSIS — C50919 Malignant neoplasm of unspecified site of unspecified female breast: Secondary | ICD-10-CM | POA: Diagnosis not present

## 2022-08-27 ENCOUNTER — Ambulatory Visit (HOSPITAL_COMMUNITY): Payer: Medicare Other

## 2022-08-27 DIAGNOSIS — Z1331 Encounter for screening for depression: Secondary | ICD-10-CM | POA: Diagnosis not present

## 2022-08-27 DIAGNOSIS — E1169 Type 2 diabetes mellitus with other specified complication: Secondary | ICD-10-CM | POA: Diagnosis not present

## 2022-08-27 DIAGNOSIS — Z0000 Encounter for general adult medical examination without abnormal findings: Secondary | ICD-10-CM | POA: Diagnosis not present

## 2022-08-27 DIAGNOSIS — K529 Noninfective gastroenteritis and colitis, unspecified: Secondary | ICD-10-CM | POA: Diagnosis not present

## 2022-08-27 DIAGNOSIS — I700 Atherosclerosis of aorta: Secondary | ICD-10-CM | POA: Diagnosis not present

## 2022-08-28 ENCOUNTER — Encounter: Payer: Self-pay | Admitting: *Deleted

## 2022-09-08 DIAGNOSIS — F331 Major depressive disorder, recurrent, moderate: Secondary | ICD-10-CM | POA: Diagnosis not present

## 2022-09-12 ENCOUNTER — Encounter: Payer: Self-pay | Admitting: *Deleted

## 2022-09-23 ENCOUNTER — Other Ambulatory Visit: Payer: Self-pay | Admitting: Family Medicine

## 2022-09-23 DIAGNOSIS — E1169 Type 2 diabetes mellitus with other specified complication: Secondary | ICD-10-CM | POA: Diagnosis not present

## 2022-09-23 DIAGNOSIS — J40 Bronchitis, not specified as acute or chronic: Secondary | ICD-10-CM | POA: Diagnosis not present

## 2022-09-23 DIAGNOSIS — E785 Hyperlipidemia, unspecified: Secondary | ICD-10-CM | POA: Diagnosis not present

## 2022-09-23 DIAGNOSIS — I700 Atherosclerosis of aorta: Secondary | ICD-10-CM

## 2022-10-08 ENCOUNTER — Other Ambulatory Visit: Payer: Self-pay | Admitting: *Deleted

## 2022-10-13 ENCOUNTER — Other Ambulatory Visit: Payer: Self-pay | Admitting: Hematology

## 2022-10-14 DIAGNOSIS — K080 Exfoliation of teeth due to systemic causes: Secondary | ICD-10-CM | POA: Diagnosis not present

## 2022-10-17 ENCOUNTER — Other Ambulatory Visit: Payer: Self-pay

## 2022-10-17 DIAGNOSIS — Z170 Estrogen receptor positive status [ER+]: Secondary | ICD-10-CM

## 2022-10-19 NOTE — Assessment & Plan Note (Signed)
Stage IA, pT1a c(N0M0), ER+/PR+/HER2-, Grade 2 -found on screening mammogram. S/p left lumpectomy 03/12/22 by Dr. Donell Beers, path showed 5mm IDC and DCIS. -given small tumor size, she and Dr. Mitzi Hansen agreed to forego radiation therapy. -she started adjuvant anastrozole in 04/2022

## 2022-10-20 ENCOUNTER — Encounter: Payer: Self-pay | Admitting: Hematology

## 2022-10-20 ENCOUNTER — Inpatient Hospital Stay (HOSPITAL_BASED_OUTPATIENT_CLINIC_OR_DEPARTMENT_OTHER): Payer: Medicare Other | Admitting: Hematology

## 2022-10-20 ENCOUNTER — Inpatient Hospital Stay: Payer: Medicare Other | Attending: Hematology

## 2022-10-20 ENCOUNTER — Other Ambulatory Visit: Payer: Self-pay

## 2022-10-20 VITALS — BP 137/72 | HR 72 | Temp 99.2°F | Resp 18 | Ht 62.0 in | Wt 198.1 lb

## 2022-10-20 DIAGNOSIS — Z79811 Long term (current) use of aromatase inhibitors: Secondary | ICD-10-CM | POA: Diagnosis not present

## 2022-10-20 DIAGNOSIS — C50412 Malignant neoplasm of upper-outer quadrant of left female breast: Secondary | ICD-10-CM | POA: Insufficient documentation

## 2022-10-20 DIAGNOSIS — Z170 Estrogen receptor positive status [ER+]: Secondary | ICD-10-CM | POA: Diagnosis not present

## 2022-10-20 LAB — CBC WITH DIFFERENTIAL (CANCER CENTER ONLY)
Abs Immature Granulocytes: 0.02 K/uL (ref 0.00–0.07)
Basophils Absolute: 0 K/uL (ref 0.0–0.1)
Basophils Relative: 1 %
Eosinophils Absolute: 0.1 K/uL (ref 0.0–0.5)
Eosinophils Relative: 1 %
HCT: 43.7 % (ref 36.0–46.0)
Hemoglobin: 14.8 g/dL (ref 12.0–15.0)
Immature Granulocytes: 0 %
Lymphocytes Relative: 28 %
Lymphs Abs: 2.1 K/uL (ref 0.7–4.0)
MCH: 33.6 pg (ref 26.0–34.0)
MCHC: 33.9 g/dL (ref 30.0–36.0)
MCV: 99.3 fL (ref 80.0–100.0)
Monocytes Absolute: 0.4 K/uL (ref 0.1–1.0)
Monocytes Relative: 6 %
Neutro Abs: 4.8 K/uL (ref 1.7–7.7)
Neutrophils Relative %: 64 %
Platelet Count: 286 K/uL (ref 150–400)
RBC: 4.4 MIL/uL (ref 3.87–5.11)
RDW: 12.5 % (ref 11.5–15.5)
WBC Count: 7.4 K/uL (ref 4.0–10.5)
nRBC: 0 % (ref 0.0–0.2)

## 2022-10-20 LAB — CMP (CANCER CENTER ONLY)
ALT: 48 U/L — ABNORMAL HIGH (ref 0–44)
AST: 28 U/L (ref 15–41)
Albumin: 4.5 g/dL (ref 3.5–5.0)
Alkaline Phosphatase: 81 U/L (ref 38–126)
Anion gap: 12 (ref 5–15)
BUN: 18 mg/dL (ref 8–23)
CO2: 23 mmol/L (ref 22–32)
Calcium: 9.8 mg/dL (ref 8.9–10.3)
Chloride: 106 mmol/L (ref 98–111)
Creatinine: 0.91 mg/dL (ref 0.44–1.00)
GFR, Estimated: >60 mL/min
Glucose, Bld: 120 mg/dL — ABNORMAL HIGH (ref 70–99)
Potassium: 3.9 mmol/L (ref 3.5–5.1)
Sodium: 141 mmol/L (ref 135–145)
Total Bilirubin: 0.5 mg/dL (ref 0.3–1.2)
Total Protein: 7.2 g/dL (ref 6.5–8.1)

## 2022-10-20 NOTE — Progress Notes (Signed)
[facility]   [phone] [phone]   Clinic Follow up Note   Patient Care Team: Charlane Ferretti, DO as PCP - General (Internal Medicine) Drema Dallas, DO as Consulting Physician (Neurology) Almond Lint, MD as Consulting Physician (General Surgery) Malachy Mood, MD as Consulting Physician (Hematology) Dorothy Puffer, MD as Consulting Physician (Radiation Oncology) Angelyn Punt, MD as Consulting Physician (Gastroenterology)  Date of Service:  10/20/2022  CHIEF COMPLAINT: f/u of left breast cancer  CURRENT THERAPY:  Started 04/2022 Anastrozole  ASSESSMENT:  Tara Davis is a 77 y.o. female with   Malignant neoplasm of upper-outer quadrant of left breast in female, estrogen receptor positive (HCC)  Stage IA, pT1a c(N0M0), ER+/PR+/HER2-, Grade 2 -found on screening mammogram. S/p left lumpectomy 03/12/22 by Dr. Donell Beers, path showed 5mm IDC and DCIS. -given small tumor size, she and Dr. Mitzi Hansen agreed to forego radiation therapy. -she started adjuvant anastrozole in 04/2022, she is tolerating well -She is clinically doing very well, asymptomatic, lab reviewed, exam was unremarkable, no clinical concern for recurrence -Continue breast cancer surveillance  Bone Health  -Her most recent DEXA on 01/30/20 was normal, T-score of +0.3.      PLAN: -lab reviewed -Mammogram due 01/2023 -lab and f/u in 6 months   SUMMARY OF ONCOLOGIC HISTORY: Oncology History Overview Note   Cancer Staging  Malignant neoplasm of upper-outer quadrant of left breast in female, estrogen receptor positive (HCC) Staging form: Breast, AJCC 8th Edition - Clinical stage from 02/11/2022: Stage IA (cT1a, cN0, cM0, G2, ER+, PR+, HER2-) - Signed by Malachy Mood, MD on 02/23/2022 Stage prefix: Initial diagnosis Method of lymph node assessment: Clinical Histologic grading system: 3 grade system - Pathologic stage from 03/12/2022: Stage Unknown (pT1a, pNX, cM0, G2, ER+, PR+, HER2-) - Signed  by Malachy Mood, MD on 04/21/2022 Stage prefix: Initial diagnosis Histologic grading system: 3 grade system Residual tumor (R): R0 - None     Malignant neoplasm of upper-outer quadrant of left breast in female, estrogen receptor positive (HCC)  02/11/2022 Initial Biopsy   Diagnosis Breast, left, needle core biopsy, 2 o'clock, 8cmfn - INVASIVE DUCTAL CARCINOMA, SEE NOTE - DUCTAL CARCINOMA IN SITU, INTERMEDIATE GRADE - TUBULE FORMATION: SCORE 3 - NUCLEAR PLEOMORPHISM: SCORE 2 - MITOTIC COUNT: SCORE 1 - TOTAL SCORE: 6 - OVERALL GRADE: 2 - LYMPHOVASCULAR INVASION: NOT IDENTIFIED - CANCER LENGTH: 0.3 CM - CALCIFICATIONS: NOT IDENTIFIED - OTHER FINDINGS: NONE   02/11/2022 Cancer Staging   Staging form: Breast, AJCC 8th Edition - Clinical stage from 02/11/2022: Stage IA (cT1a, cN0, cM0, G2, ER+, PR+, HER2-) - Signed by Malachy Mood, MD on 02/23/2022 Stage prefix: Initial diagnosis Method of lymph node assessment: Clinical Histologic grading system: 3 grade system   02/17/2022 Initial Diagnosis   Malignant neoplasm of upper-outer quadrant of left breast in female, estrogen receptor positive (HCC)    Genetic Testing   [facility] was Negative. Report date is 03/01/2022.  The CancerNext gene panel offered by [employer] includes sequencing, rearrangement analysis, and RNA analysis for the following 36 genes:   APC, ATM, AXIN2, BARD1, BMPR1A, BRCA1, BRCA2, BRIP1, CDH1, CDK4, CDKN2A, CHEK2, DICER1, HOXB13, EPCAM, GREM1, MLH1, MSH2, MSH3, MSH6, MUTYH, NBN, NF1, NTHL1, PALB2, PMS2, POLD1, POLE, PTEN, RAD51C, RAD51D, RECQL, SMAD4, SMARCA4, STK11, and TP53.    03/12/2022 Cancer Staging   Staging form: Breast, AJCC 8th Edition - Pathologic stage from 03/12/2022: Stage Unknown (pT1a, pNX, cM0, G2, ER+, PR+, HER2-) - Signed by Malachy Mood, MD on 04/21/2022  Stage prefix: Initial diagnosis Histologic grading system: 3 grade system Residual tumor (R): R0 - None   04/2022 -  Anti-estrogen oral therapy    Anastrozole      INTERVAL HISTORY:  Tara Davis is here for a follow up of lef breast cancer. She was last seen by NP Mardella Layman on 07/21/2022. She presents to the clinic alone.Pt denies having any issues with taken Anastrozole. Pt state that she does exercises everyday at home. Overall she is clinically doing well.    All other systems were reviewed with the patient and are negative.  MEDICAL HISTORY:  Past Medical History:  Diagnosis Date   Adenomatous colon polyp    Anxiety    Arthritis    Bilateral cataracts    within 6 mos.   Depression    Diverticulosis    Fatty liver    GERD (gastroesophageal reflux disease)    H. pylori infection    Hyperlipidemia    Ulcer 1988   Uterine fibroid     SURGICAL HISTORY: Past Surgical History:  Procedure Laterality Date   APPENDECTOMY     BREAST LUMPECTOMY WITH RADIOACTIVE SEED LOCALIZATION Left 03/12/2022   Procedure: LEFT BREAST LUMPECTOMY WITH RADIOACTIVE SEED LOCALIZATION;  Surgeon: Almond Lint, MD;  Location: [facility] OR;  Service: General;  Laterality: Left;   CATARACT EXTRACTION Bilateral    CATARACT EXTRACTION, BILATERAL     CHOLECYSTECTOMY     COLONOSCOPY     spontaneous vaginal delivery     x2   TONSILLECTOMY     TOTAL KNEE ARTHROPLASTY Left 10/11/2021   Procedure: LEFT TOTAL KNEE ARTHROPLASTY;  Surgeon: Nadara Mustard, MD;  Location: [facility] OR;  Service: Orthopedics;  Laterality: Left;   WISDOM TOOTH EXTRACTION      I have reviewed the social history and family history with the patient and they are unchanged from previous note.  ALLERGIES:  is allergic to amoxicillin-pot clavulanate, codeine, and penicillins.  MEDICATIONS:  Current Outpatient Medications  Medication Sig Dispense Refill   anastrozole (ARIMIDEX) 1 MG tablet TAKE 1 TABLET BY MOUTH DAILY 30 tablet 5   ARIPiprazole (ABILIFY) 2 MG tablet Take 2 mg by mouth daily.     atorvastatin (LIPITOR) 20 MG tablet TAKE ONE TABLET BY MOUTH DAILY 76 tablet 1   Bacillus  Coagulans-Inulin (BENEFIBER PREBIOTIC+PROBIOTIC PO) Take 1 Scoop by mouth daily.     dicyclomine (BENTYL) 10 MG capsule Take 10 mg by mouth in the morning and at bedtime.     loperamide (IMODIUM A-D) 2 MG tablet Take 2 mg by mouth 4 (four) times daily as needed for diarrhea or loose stools.     LORazepam (ATIVAN) 0.5 MG tablet Take 1 mg by mouth 2 (two) times daily.     nicotine (NICODERM CQ - DOSED IN MG/24 HOURS) 21 mg/24hr patch Place 21 mg onto the skin daily as needed (nicotine).     omeprazole (PRILOSEC) 40 MG capsule Take 40 mg by mouth daily.     ondansetron (ZOFRAN) 4 MG tablet Take 1 tablet (4 mg total) by mouth every 8 (eight) hours as needed for nausea. 8 tablet 5   vortioxetine HBr (TRINTELLIX) 20 MG TABS tablet Take 20 mg by mouth daily.     zolpidem (AMBIEN) 10 MG tablet Take 10 mg by mouth at bedtime as needed for sleep.     No current facility-administered medications for this visit.    PHYSICAL EXAMINATION: ECOG PERFORMANCE STATUS: 0  Vitals:   10/20/22 1214  BP:  137/72  Pulse: 72  Resp: 18  Temp: 99.2 F (37.3 C)  SpO2: 97%   Wt Readings from Last 3 Encounters:  10/20/22 198 lb 1.6 oz (89.9 kg)  07/21/22 206 lb 9.6 oz (93.7 kg)  04/21/22 199 lb 6.4 oz (90.4 kg)     GENERAL:alert, no distress and comfortable SKIN: skin color normal, no rashes or significant lesions EYES: normal, Conjunctiva are pink and non-injected, sclera clear  NEURO: alert & oriented x 3 with fluent speech NECK: (-) supple, thyroid normal size, (-) non-tender, without nodularity LYMPH: (-)  no palpable lymphadenopathy in the cervical, axillary  ABDOMEN:(-)abdomen soft, non-tender and normal bowel sounds speech, no focal motor/sensory deficits BREAST:RT breast no palpable mass. L breast lumpectomy , minimum scar tissue no palpable mass breast exam benign.   LABORATORY DATA:  I have reviewed the data as listed    Latest Ref Rng & Units 10/20/2022   11:44 AM 02/19/2022   12:15 PM  10/03/2021    3:51 PM  CBC  WBC 4.0 - 10.5 K/uL 7.4  9.7  9.8   Hemoglobin 12.0 - 15.0 g/dL 16.1  09.6  04.5   Hematocrit 36.0 - 46.0 % 43.7  43.0  38.2   Platelets 150 - 400 K/uL 286  300  288         Latest Ref Rng & Units 10/20/2022   11:44 AM 02/19/2022   12:15 PM 10/03/2021    3:51 PM  CMP  Glucose 70 - 99 mg/dL 409  811  99   BUN 8 - 23 mg/dL 18  21  17    Creatinine 0.44 - 1.00 mg/dL 9.14  7.82  9.56   Sodium 135 - 145 mmol/L 141  139  144   Potassium 3.5 - 5.1 mmol/L 3.9  3.9  4.1   Chloride 98 - 111 mmol/L 106  105  106   CO2 22 - 32 mmol/L 23  27  21    Calcium 8.9 - 10.3 mg/dL 9.8  9.9  9.2   Total Protein 6.5 - 8.1 g/dL 7.2  7.3  6.5   Total Bilirubin 0.3 - 1.2 mg/dL 0.5  0.4  <2.1   Alkaline Phos 38 - 126 U/L 81  82  124   AST 15 - 41 U/L 28  21  28    ALT 0 - 44 U/L 48  25  58       RADIOGRAPHIC STUDIES: I have personally reviewed the radiological images as listed and agreed with the findings in the report. No results found.    No orders of the defined types were placed in this encounter.  All questions were answered. The patient knows to call the clinic with any problems, questions or concerns. No barriers to learning was detected. The total time spent in the appointment was 20 minutes.     Malachy Mood, MD 10/20/2022   Carolin Coy, CMA, am acting as scribe for Malachy Mood, MD.   I have reviewed the above documentation for accuracy and completeness, and I agree with the above.

## 2022-10-22 ENCOUNTER — Ambulatory Visit (HOSPITAL_COMMUNITY)
Admission: RE | Admit: 2022-10-22 | Discharge: 2022-10-22 | Disposition: A | Discharge status: Home / Self Care | Payer: Medicare Other | Source: Ambulatory Visit | Attending: Adult Health | Admitting: Adult Health

## 2022-10-22 DIAGNOSIS — I700 Atherosclerosis of aorta: Secondary | ICD-10-CM | POA: Insufficient documentation

## 2022-10-22 DIAGNOSIS — Z122 Encounter for screening for malignant neoplasm of respiratory organs: Secondary | ICD-10-CM | POA: Insufficient documentation

## 2022-10-22 DIAGNOSIS — J439 Emphysema, unspecified: Secondary | ICD-10-CM | POA: Diagnosis not present

## 2022-10-22 DIAGNOSIS — F17210 Nicotine dependence, cigarettes, uncomplicated: Secondary | ICD-10-CM | POA: Diagnosis not present

## 2022-10-22 DIAGNOSIS — Z720 Tobacco use: Secondary | ICD-10-CM | POA: Insufficient documentation

## 2022-10-27 ENCOUNTER — Telehealth: Payer: Self-pay

## 2022-10-27 NOTE — Telephone Encounter (Signed)
Notified pt of message below. Pt verbalized understanding.  Tara Davis M. CMA 

## 2022-10-27 NOTE — Telephone Encounter (Signed)
-----   Message from Loa Socks, NP sent at 10/27/2022  9:54 AM EDT ----- CT scan lung cancer screening is stable.  I recommend repeat in 1 year.  Please let Alvino Chapel know ----- Message ----- From: Leory Plowman, Rad Results In Sent: 10/26/2022  12:44 AM EDT To: Loa Socks, NP

## 2022-10-28 ENCOUNTER — Ambulatory Visit
Admission: RE | Admit: 2022-10-28 | Discharge: 2022-10-28 | Disposition: A | Discharge status: Home / Self Care | Payer: No Typology Code available for payment source | Source: Ambulatory Visit | Attending: Family Medicine | Admitting: Family Medicine

## 2022-10-28 DIAGNOSIS — I700 Atherosclerosis of aorta: Secondary | ICD-10-CM

## 2022-11-06 DIAGNOSIS — K080 Exfoliation of teeth due to systemic causes: Secondary | ICD-10-CM | POA: Diagnosis not present

## 2022-12-01 DIAGNOSIS — E1169 Type 2 diabetes mellitus with other specified complication: Secondary | ICD-10-CM | POA: Diagnosis not present

## 2022-12-01 DIAGNOSIS — I700 Atherosclerosis of aorta: Secondary | ICD-10-CM | POA: Diagnosis not present

## 2022-12-31 DIAGNOSIS — D225 Melanocytic nevi of trunk: Secondary | ICD-10-CM | POA: Diagnosis not present

## 2022-12-31 DIAGNOSIS — L814 Other melanin hyperpigmentation: Secondary | ICD-10-CM | POA: Diagnosis not present

## 2022-12-31 DIAGNOSIS — L570 Actinic keratosis: Secondary | ICD-10-CM | POA: Diagnosis not present

## 2022-12-31 DIAGNOSIS — L821 Other seborrheic keratosis: Secondary | ICD-10-CM | POA: Diagnosis not present

## 2022-12-31 DIAGNOSIS — D692 Other nonthrombocytopenic purpura: Secondary | ICD-10-CM | POA: Diagnosis not present

## 2023-02-06 DIAGNOSIS — C50412 Malignant neoplasm of upper-outer quadrant of left female breast: Secondary | ICD-10-CM | POA: Diagnosis not present

## 2023-02-06 DIAGNOSIS — Z853 Personal history of malignant neoplasm of breast: Secondary | ICD-10-CM | POA: Diagnosis not present

## 2023-02-06 DIAGNOSIS — Z170 Estrogen receptor positive status [ER+]: Secondary | ICD-10-CM | POA: Diagnosis not present

## 2023-02-10 DIAGNOSIS — F331 Major depressive disorder, recurrent, moderate: Secondary | ICD-10-CM | POA: Diagnosis not present

## 2023-02-13 ENCOUNTER — Encounter: Payer: Self-pay | Admitting: Adult Health

## 2023-02-17 DIAGNOSIS — K080 Exfoliation of teeth due to systemic causes: Secondary | ICD-10-CM | POA: Diagnosis not present

## 2023-02-17 DIAGNOSIS — E2839 Other primary ovarian failure: Secondary | ICD-10-CM | POA: Diagnosis not present

## 2023-02-23 ENCOUNTER — Encounter: Payer: Self-pay | Admitting: Adult Health

## 2023-02-26 DIAGNOSIS — E1169 Type 2 diabetes mellitus with other specified complication: Secondary | ICD-10-CM | POA: Diagnosis not present

## 2023-02-26 DIAGNOSIS — Z23 Encounter for immunization: Secondary | ICD-10-CM | POA: Diagnosis not present

## 2023-02-26 DIAGNOSIS — Z7189 Other specified counseling: Secondary | ICD-10-CM | POA: Diagnosis not present

## 2023-04-02 ENCOUNTER — Other Ambulatory Visit: Payer: Self-pay | Admitting: Hematology

## 2023-04-21 ENCOUNTER — Other Ambulatory Visit: Payer: Medicare Other

## 2023-04-21 ENCOUNTER — Encounter: Payer: Medicare Other | Admitting: Adult Health

## 2023-04-22 ENCOUNTER — Telehealth: Payer: Self-pay

## 2023-04-22 ENCOUNTER — Inpatient Hospital Stay: Payer: Medicare Other | Attending: Hematology

## 2023-04-22 ENCOUNTER — Inpatient Hospital Stay (HOSPITAL_BASED_OUTPATIENT_CLINIC_OR_DEPARTMENT_OTHER): Payer: Medicare Other | Admitting: Adult Health

## 2023-04-22 ENCOUNTER — Encounter: Payer: Self-pay | Admitting: Adult Health

## 2023-04-22 VITALS — BP 135/65 | HR 68 | Temp 97.2°F | Resp 18 | Ht 62.0 in | Wt 193.3 lb

## 2023-04-22 DIAGNOSIS — F17210 Nicotine dependence, cigarettes, uncomplicated: Secondary | ICD-10-CM | POA: Diagnosis not present

## 2023-04-22 DIAGNOSIS — Z170 Estrogen receptor positive status [ER+]: Secondary | ICD-10-CM | POA: Insufficient documentation

## 2023-04-22 DIAGNOSIS — C50412 Malignant neoplasm of upper-outer quadrant of left female breast: Secondary | ICD-10-CM | POA: Diagnosis not present

## 2023-04-22 DIAGNOSIS — Z720 Tobacco use: Secondary | ICD-10-CM | POA: Diagnosis not present

## 2023-04-22 DIAGNOSIS — Z79811 Long term (current) use of aromatase inhibitors: Secondary | ICD-10-CM | POA: Insufficient documentation

## 2023-04-22 LAB — CMP (CANCER CENTER ONLY)
ALT: 20 U/L (ref 0–44)
AST: 16 U/L (ref 15–41)
Albumin: 4.3 g/dL (ref 3.5–5.0)
Alkaline Phosphatase: 82 U/L (ref 38–126)
Anion gap: 7 (ref 5–15)
BUN: 26 mg/dL — ABNORMAL HIGH (ref 8–23)
CO2: 25 mmol/L (ref 22–32)
Calcium: 9.6 mg/dL (ref 8.9–10.3)
Chloride: 107 mmol/L (ref 98–111)
Creatinine: 1.1 mg/dL — ABNORMAL HIGH (ref 0.44–1.00)
GFR, Estimated: 52 mL/min — ABNORMAL LOW (ref >60)
Glucose, Bld: 153 mg/dL — ABNORMAL HIGH (ref 70–99)
Potassium: 3.9 mmol/L (ref 3.5–5.1)
Sodium: 139 mmol/L (ref 135–145)
Total Bilirubin: 0.4 mg/dL (ref <1.2)
Total Protein: 7 g/dL (ref 6.5–8.1)

## 2023-04-22 LAB — CBC WITH DIFFERENTIAL (CANCER CENTER ONLY)
Abs Immature Granulocytes: 0.05 10*3/uL (ref 0.00–0.07)
Basophils Absolute: 0 10*3/uL (ref 0.0–0.1)
Basophils Relative: 1 %
Eosinophils Absolute: 0.1 10*3/uL (ref 0.0–0.5)
Eosinophils Relative: 2 %
HCT: 42 % (ref 36.0–46.0)
Hemoglobin: 14.2 g/dL (ref 12.0–15.0)
Immature Granulocytes: 1 %
Lymphocytes Relative: 34 %
Lymphs Abs: 2.6 10*3/uL (ref 0.7–4.0)
MCH: 33.4 pg (ref 26.0–34.0)
MCHC: 33.8 g/dL (ref 30.0–36.0)
MCV: 98.8 fL (ref 80.0–100.0)
Monocytes Absolute: 0.4 10*3/uL (ref 0.1–1.0)
Monocytes Relative: 5 %
Neutro Abs: 4.5 10*3/uL (ref 1.7–7.7)
Neutrophils Relative %: 57 %
Platelet Count: 267 10*3/uL (ref 150–400)
RBC: 4.25 MIL/uL (ref 3.87–5.11)
RDW: 13.2 % (ref 11.5–15.5)
WBC Count: 7.7 10*3/uL (ref 4.0–10.5)
nRBC: 0 % (ref 0.0–0.2)

## 2023-04-22 NOTE — Assessment & Plan Note (Signed)
Stage IA, pT1a c(N0M0), ER+/PR+/HER2-, Grade 2 diagnosed in 02/2023 s/p lumpectomy and anitestrogen therapy with anastrozole beginning in 04/2022.  Breast Cancer No evidence of malignancy on recent mammogram. Patient is on Anastrozole with no reported side effects. -Continue with annual mammograms. -Continue Anastrozole as prescribed. -Follow up with Dr. Blake Divine in 6 months for labs and check-up.  Type 2 Diabetes Patient is on Rybelsus. Recent interruption in medication due to cost, but now resolved. -Continue Rybelsus as prescribed. -f/u with PCP for diabetes management -will fax CMP results with elevated creatinine and BUN to PCP--encouraged fluid intake.  Bone Health Normal bone density on recent DEXA scan. -Continue current management. -Repeat DEXA in 2 years time  Obesity Discussed the benefits of weight loss for diabetes and breast cancer recurrence risk. Patient is currently exercising daily. -Encourage continued daily exercise and increased intake of fruits and vegetables.  Tobacco Use Patient continues to smoke. Discussed the benefits of lung cancer screening due to significant tobacco history. -Order annual lung cancer screening due in May. -Encourage smoking cessation-handout given in AVS on smoking cessation ON DEMAND classes.   RTC in 6 months for f/u as noted above or sooner if needed.

## 2023-04-22 NOTE — Progress Notes (Signed)
[facility] Cancer Follow up:    Tara Ferretti, DO [address] [name] [address]   DIAGNOSIS:  Cancer Staging  Malignant neoplasm of upper-outer quadrant of left breast in female, estrogen receptor positive (HCC) Staging form: Breast, AJCC 8th Edition - Clinical stage from 02/11/2022: Stage IA (cT1a, cN0, cM0, G2, ER+, PR+, HER2-) - Signed by Malachy Mood, MD on 02/23/2022 Stage prefix: Initial diagnosis Method of lymph node assessment: Clinical Histologic grading system: 3 grade system - Pathologic stage from 03/12/2022: Stage Unknown (pT1a, pNX, cM0, G2, ER+, PR+, HER2-) - Signed by Malachy Mood, MD on 04/21/2022 Stage prefix: Initial diagnosis Histologic grading system: 3 grade system Residual tumor (R): R0 - None   SUMMARY OF ONCOLOGIC HISTORY: Oncology History Overview Note   Cancer Staging  Malignant neoplasm of upper-outer quadrant of left breast in female, estrogen receptor positive (HCC) Staging form: Breast, AJCC 8th Edition - Clinical stage from 02/11/2022: Stage IA (cT1a, cN0, cM0, G2, ER+, PR+, HER2-) - Signed by Malachy Mood, MD on 02/23/2022 Stage prefix: Initial diagnosis Method of lymph node assessment: Clinical Histologic grading system: 3 grade system - Pathologic stage from 03/12/2022: Stage Unknown (pT1a, pNX, cM0, G2, ER+, PR+, HER2-) - Signed by Malachy Mood, MD on 04/21/2022 Stage prefix: Initial diagnosis Histologic grading system: 3 grade system Residual tumor (R): R0 - None     Malignant neoplasm of upper-outer quadrant of left breast in female, estrogen receptor positive (HCC)  02/11/2022 Initial Biopsy   Diagnosis Breast, left, needle core biopsy, 2 o'clock, 8cmfn - INVASIVE DUCTAL CARCINOMA, SEE NOTE - DUCTAL CARCINOMA IN SITU, INTERMEDIATE GRADE - TUBULE FORMATION: SCORE 3 - NUCLEAR PLEOMORPHISM: SCORE 2 - MITOTIC COUNT: SCORE 1 - TOTAL SCORE: 6 - OVERALL GRADE: 2 - LYMPHOVASCULAR INVASION: NOT IDENTIFIED - CANCER LENGTH: 0.3 CM -  CALCIFICATIONS: NOT IDENTIFIED - OTHER FINDINGS: NONE   02/11/2022 Cancer Staging   Staging form: Breast, AJCC 8th Edition - Clinical stage from 02/11/2022: Stage IA (cT1a, cN0, cM0, G2, ER+, PR+, HER2-) - Signed by Malachy Mood, MD on 02/23/2022 Stage prefix: Initial diagnosis Method of lymph node assessment: Clinical Histologic grading system: 3 grade system   02/17/2022 Initial Diagnosis   Malignant neoplasm of upper-outer quadrant of left breast in female, estrogen receptor positive (HCC)    Genetic Testing   [facility] Cancer[facility] was Negative. Report date is 03/01/2022.  The CancerNext gene panel offered by [employer] includes sequencing, rearrangement analysis, and RNA analysis for the following 36 genes:   APC, ATM, AXIN2, BARD1, BMPR1A, BRCA1, BRCA2, BRIP1, CDH1, CDK4, CDKN2A, CHEK2, DICER1, HOXB13, EPCAM, GREM1, MLH1, MSH2, MSH3, MSH6, MUTYH, NBN, NF1, NTHL1, PALB2, PMS2, POLD1, POLE, PTEN, RAD51C, RAD51D, RECQL, SMAD4, SMARCA4, STK11, and TP53.    03/12/2022 Cancer Staging   Staging form: Breast, AJCC 8th Edition - Pathologic stage from 03/12/2022: Stage Unknown (pT1a, pNX, cM0, G2, ER+, PR+, HER2-) - Signed by Malachy Mood, MD on 04/21/2022 Stage prefix: Initial diagnosis Histologic grading system: 3 grade system Residual tumor (R): R0 - None   03/12/2022 Surgery   S/p left lumpectomy by Dr. Donell Beers, path showed 5mm IDC and DCIS.    04/2022 -  Anti-estrogen oral therapy   Anastrozole     CURRENT THERAPY: Anastrozole  INTERVAL HISTORY: Tara STETLER 77 y.o. female The patient, with a history of breast cancer, type 2 diabetes, and an artificial knee, presents for a routine follow-up. She reports no new health issues and denies experiencing any hot flashes, vaginal dryness,  or joint aches and pains associated with her anastrozole medication. She has been adhering to her prescribed medication regimen, which includes anastrozole and Rybelsus for diabetes management.  The patient  experienced a brief interruption in her Rybelsus medication due to cost issues, during which she was switched to Januvia. She noticed a slight increase in her blood glucose levels during this period. However, she has since resumed her Rybelsus medication through patient assistance.  The patient acknowledges a diagnosis of type 2 diabetes within the past year and has noticed some initial weight loss with the introduction of Rybelsus, although this has plateaued recently. She engages in daily exercise to manage her artificial knee and maintain mobility.  The patient has a significant tobacco use history, with a 60 pack-year record. She continues to smoke, despite being eligible for annual lung cancer screenings due to her tobacco use history. She underwent a lung cancer screening in May, the results were negative.   Patient Active Problem List   Diagnosis Date Noted   Hardening of the aorta (main artery of the heart) (HCC) 07/21/2022   BMI 36.0-36.9,adult 07/21/2022   Moderate major depression (HCC) 07/21/2022   Severe obesity (HCC) 07/21/2022   Genetic testing 03/04/2022   Family history of breast cancer 02/19/2022   Malignant neoplasm of upper-outer quadrant of left breast in female, estrogen receptor positive (HCC) 02/17/2022   Total knee replacement status, left 10/11/2021   Unilateral primary osteoarthritis, left knee    History of colonic polyps 08/07/2020   Gastroesophageal reflux disease 08/23/2019   Tobacco abuse 01/12/2018   Cigarette smoker 01/12/2018   IBS 07/05/2008    is allergic to amoxicillin-pot clavulanate, codeine, and penicillins.  MEDICAL HISTORY: Past Medical History:  Diagnosis Date   Adenomatous colon polyp    Anxiety    Arthritis    Bilateral cataracts    within 6 mos.   Depression    Diverticulosis    Fatty liver    GERD (gastroesophageal reflux disease)    H. pylori infection    Hyperlipidemia    Ulcer 1988   Uterine fibroid     SURGICAL  HISTORY: Past Surgical History:  Procedure Laterality Date   APPENDECTOMY     BREAST LUMPECTOMY WITH RADIOACTIVE SEED LOCALIZATION Left 03/12/2022   Procedure: LEFT BREAST LUMPECTOMY WITH RADIOACTIVE SEED LOCALIZATION;  Surgeon: Almond Lint, MD;  Location: [facility] OR;  Service: General;  Laterality: Left;   CATARACT EXTRACTION Bilateral    CATARACT EXTRACTION, BILATERAL     CHOLECYSTECTOMY     COLONOSCOPY     spontaneous vaginal delivery     x2   TONSILLECTOMY     TOTAL KNEE ARTHROPLASTY Left 10/11/2021   Procedure: LEFT TOTAL KNEE ARTHROPLASTY;  Surgeon: Nadara Mustard, MD;  Location: [facility] OR;  Service: Orthopedics;  Laterality: Left;   WISDOM TOOTH EXTRACTION      SOCIAL HISTORY: Social History   Socioeconomic History   Marital status: Single    Spouse name: Not on file   Number of children: 2   Years of education: 16   Highest education level: Bachelor's degree (e.g., BA, AB, BS)  Occupational History   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 She lives with her long time boyfriend of 25+ years ina 2 level home. She drinks green tea, 2-4 cups a day. She does not exercise.   Social Determinants of Health   Financial Resource Strain: Low Risk  (06/14/2020)   Overall Financial Resource Strain (CARDIA)    Difficulty of Paying Living Expenses: Not hard at all  Food Insecurity: No Food Insecurity (06/14/2020)   Hunger Vital Sign    Worried About Running Out of Food  in the Last Year: Never true    Ran Out of Food in the Last Year: Never true  Transportation Needs: No Transportation Needs (06/14/2020)   PRAPARE - Administrator, Civil Service (Medical): No    Lack of Transportation (Non-Medical): No  Physical Activity: Inactive (06/14/2020)   Exercise Vital Sign    Days of Exercise per Week: 0 days    Minutes of Exercise per Session: 0 min  Stress: No Stress Concern Present (06/14/2020)   [employer] of Occupational Health - Occupational Stress Questionnaire    Feeling of Stress : Not at all  Social Connections: Not on file  Intimate Partner Violence: Not on file    FAMILY HISTORY: Family History  Problem Relation Age of Onset   Alzheimer's disease Mother    Depression Father    Diabetes Father    Breast cancer Sister 14   Colon cancer Neg Hx    Esophageal cancer Neg Hx    Stomach cancer Neg Hx     Review of Systems  Constitutional:  Negative for appetite change, chills, fatigue, fever and unexpected weight change.  HENT:   Negative for hearing loss, lump/mass and trouble swallowing.   Eyes:  Negative for eye problems and icterus.  Respiratory:  Negative for chest tightness, cough and shortness of breath.   Cardiovascular:  Negative for chest pain, leg swelling and palpitations.  Gastrointestinal:  Negative for abdominal distention, abdominal pain, constipation, diarrhea, nausea and vomiting.  Endocrine: Negative for hot flashes.  Genitourinary:  Negative for difficulty urinating.   Musculoskeletal:  Negative for arthralgias.  Skin:  Negative for itching and rash.  Neurological:  Negative for dizziness, extremity weakness, headaches and numbness.  Hematological:  Negative for adenopathy. Does not bruise/bleed easily.  Psychiatric/Behavioral:  Negative for depression. The patient is not nervous/anxious.       PHYSICAL EXAMINATION    Vitals:   04/22/23 0922  BP: 135/65  Pulse: 68  Resp: 18  Temp: (!) 97.2 F (36.2  C)  SpO2: 96%    Physical Exam Constitutional:      General: She is not in acute distress.    Appearance: Normal appearance. She is not toxic-appearing.  HENT:     Head: Normocephalic and atraumatic.     Mouth/Throat:     Mouth: Mucous membranes are moist.     Pharynx: Oropharynx is clear. No oropharyngeal exudate or posterior oropharyngeal erythema.  Eyes:     General: No scleral icterus. Cardiovascular:     Rate and Rhythm: Normal rate and regular rhythm.     Pulses: Normal pulses.     Heart sounds: Normal heart sounds.  Pulmonary:     Effort:  Pulmonary effort is normal.     Breath sounds: Normal breath sounds.  Chest:     Comments: Left breast status postlumpectomy no sign of local recurrence right breast is benign Abdominal:     General: Abdomen is flat. Bowel sounds are normal. There is no distension.     Palpations: Abdomen is soft.     Tenderness: There is no abdominal tenderness.  Musculoskeletal:        General: No swelling.     Cervical back: Neck supple.  Lymphadenopathy:     Cervical: No cervical adenopathy.     Upper Body:     Right upper body: No axillary adenopathy.     Left upper body: No axillary adenopathy.  Skin:    General: Skin is warm and dry.     Findings: No rash.  Neurological:     General: No focal deficit present.     Mental Status: She is alert.  Psychiatric:        Mood and Affect: Mood normal.        Behavior: Behavior normal.     LABORATORY DATA:  CBC    Component Value Date/Time   WBC 7.7 04/22/2023 0910   WBC 9.2 08/22/2015 1234   RBC 4.25 04/22/2023 0910   HGB 14.2 04/22/2023 0910   HGB 13.7 10/03/2021 1551   HCT 42.0 04/22/2023 0910   HCT 38.2 10/03/2021 1551   PLT 267 04/22/2023 0910   PLT 288 10/03/2021 1551   MCV 98.8 04/22/2023 0910   MCV 97 10/03/2021 1551   MCH 33.4 04/22/2023 0910   MCHC 33.8 04/22/2023 0910   RDW 13.2 04/22/2023 0910   RDW 12.1 10/03/2021 1551   LYMPHSABS 2.6 04/22/2023 0910   LYMPHSABS  2.1 12/07/2018 0922   MONOABS 0.4 04/22/2023 0910   EOSABS 0.1 04/22/2023 0910   EOSABS 0.2 12/07/2018 0922   BASOSABS 0.0 04/22/2023 0910   BASOSABS 0.1 12/07/2018 0922    CMP     Component Value Date/Time   NA 139 04/22/2023 0910   NA 144 10/03/2021 1551   K 3.9 04/22/2023 0910   CL 107 04/22/2023 0910   CO2 25 04/22/2023 0910   GLUCOSE 153 (H) 04/22/2023 0910   BUN 26 (H) 04/22/2023 0910   BUN 17 10/03/2021 1551   CREATININE 1.10 (H) 04/22/2023 0910   CREATININE 0.96 12/17/2014 1542   CALCIUM 9.6 04/22/2023 0910   PROT 7.0 04/22/2023 0910   PROT 6.5 10/03/2021 1551   ALBUMIN 4.3 04/22/2023 0910   ALBUMIN 4.4 10/03/2021 1551   AST 16 04/22/2023 0910   ALT 20 04/22/2023 0910   ALKPHOS 82 04/22/2023 0910   BILITOT 0.4 04/22/2023 0910   GFRNONAA 52 (L) 04/22/2023 0910   GFRAA 73 12/07/2018 0922       ASSESSMENT and THERAPY PLAN:   Malignant neoplasm of upper-outer quadrant of left breast in female, estrogen receptor positive (HCC)  Stage IA, pT1a c(N0M0), ER+/PR+/HER2-, Grade 2 diagnosed in 02/2023 s/p lumpectomy and anitestrogen therapy with anastrozole beginning in 04/2022.  Breast Cancer No evidence of malignancy on recent mammogram. Patient is on Anastrozole with no reported side effects. -Continue with annual mammograms. -Continue Anastrozole as prescribed. -Follow up with Dr. Blake Divine in 6 months for labs and check-up.  Type 2 Diabetes Patient is on Rybelsus. Recent interruption in medication due to cost, but now resolved. -Continue Rybelsus as prescribed. -f/u with PCP for diabetes management -will fax CMP results with elevated creatinine and BUN to PCP--encouraged fluid  intake.  Bone Health Normal bone density on recent DEXA scan. -Continue current management. -Repeat DEXA in 2 years time  Obesity Discussed the benefits of weight loss for diabetes and breast cancer recurrence risk. Patient is currently exercising daily. -Encourage continued daily  exercise and increased intake of fruits and vegetables.  Tobacco Use Patient continues to smoke. Discussed the benefits of lung cancer screening due to significant tobacco history. -Order annual lung cancer screening due in May. -Encourage smoking cessation-handout given in AVS on smoking cessation ON DEMAND classes.   RTC in 6 months for f/u as noted above or sooner if needed.     All questions were answered. The patient knows to call the clinic with any problems, questions or concerns. We can certainly see the patient much sooner if necessary.  Total encounter time:30 minutes*in face-to-face visit time, chart review, lab review, care coordination, order entry, and documentation of the encounter time.    Lillard Anes, NP 04/22/23 10:33 AM Medical Oncology and Hematology [facility] [address] [place], [place] [address] Tel. [phone]    Fax. [phone]  *Total Encounter Time as defined by the Centers for Medicare and Medicaid Services includes, in addition to the face-to-face time of a patient visit (documented in the note above) non-face-to-face time: obtaining and reviewing outside history, ordering and reviewing medications, tests or procedures, care coordination (communications with other health care professionals or caregivers) and documentation in the medical record.

## 2023-04-22 NOTE — Telephone Encounter (Signed)
Faxed Cbc and c Mets to pt. Pcp

## 2023-04-22 NOTE — Patient Instructions (Addendum)
If you are interested in quitting smoking.   Find a program that suits you best: when you want to quit, how you need support, where you live, and how you like to learn.    If you're ready to get started TODAY, consider scheduling a visit through [email or web address].  Appointments are available from 8am to 8pm, Monday to Friday.   Most health insurance plans will cover some level of tobacco cessation visits and medications.    Additional Resources: [employer] are also available to help you quit & provide the support you'll need. Many programs are available in both [place] and Spanish and have a long history of successfully helping people get off and stay off tobacco.    Quit Smoking Apps:  quitSTART at [employer].de QuitGuide?at ForgetParking.dk Online education and resources: Smokefree  at [employer].gov Free Telephone Coaching: QuitNow,  Call 1-800-QUIT-NOW (4636603423) or Text- Ready to 640-550-3889 *Quitline [place] [place] has teamed up with Medicaid to offer a free 14 week program    Vaping- Want to Quit? Free 24/7 support. Call [facility]  [place], [place], [place], [place], [place]  [facility]

## 2023-04-23 ENCOUNTER — Telehealth: Payer: Self-pay | Admitting: Hematology

## 2023-04-23 DIAGNOSIS — K080 Exfoliation of teeth due to systemic causes: Secondary | ICD-10-CM | POA: Diagnosis not present

## 2023-04-23 NOTE — Telephone Encounter (Signed)
Spoke with patient confirming upcoming appointment  

## 2023-06-16 DIAGNOSIS — F331 Major depressive disorder, recurrent, moderate: Secondary | ICD-10-CM | POA: Diagnosis not present

## 2023-06-23 DIAGNOSIS — K080 Exfoliation of teeth due to systemic causes: Secondary | ICD-10-CM | POA: Diagnosis not present

## 2023-07-13 ENCOUNTER — Other Ambulatory Visit: Payer: Self-pay

## 2023-07-15 ENCOUNTER — Other Ambulatory Visit: Payer: Self-pay

## 2023-07-28 ENCOUNTER — Telehealth: Payer: Self-pay | Admitting: Hematology

## 2023-07-28 NOTE — Telephone Encounter (Signed)
Rescheduled appointments per provider [facility]. Left VM with appointment details.

## 2023-07-30 ENCOUNTER — Other Ambulatory Visit (HOSPITAL_COMMUNITY): Payer: Self-pay | Admitting: Psychiatry

## 2023-08-13 ENCOUNTER — Other Ambulatory Visit: Payer: Self-pay

## 2023-08-19 ENCOUNTER — Other Ambulatory Visit: Payer: Self-pay

## 2023-08-19 ENCOUNTER — Telehealth: Payer: Self-pay

## 2023-08-19 DIAGNOSIS — Z1211 Encounter for screening for malignant neoplasm of colon: Secondary | ICD-10-CM

## 2023-08-19 DIAGNOSIS — Z170 Estrogen receptor positive status [ER+]: Secondary | ICD-10-CM

## 2023-08-19 NOTE — Progress Notes (Signed)
 Faxed colonoscopy referral packet to [facility] [phone]).  Fax confirmation received.

## 2023-08-19 NOTE — Telephone Encounter (Signed)
 Pt called requesting if Dr. Mosetta Putt would send a referral over to [facility] for colonoscopy.  Pt stated her last colonoscopy was done on 11/07/2016 by Dr. Rob Bunting who is no longer affiliated with [facility].  Pt stated she also see a Dr. Donnal Debar at [facility] but has never had a colonoscopy with Dr. Donnal Debar since she's been seeing him.  Stated this nurse will make Dr. Mosetta Putt aware of the pt's request.  Pt had no further questions or concerns at this time.

## 2023-08-28 DIAGNOSIS — E1169 Type 2 diabetes mellitus with other specified complication: Secondary | ICD-10-CM | POA: Diagnosis not present

## 2023-08-28 DIAGNOSIS — E785 Hyperlipidemia, unspecified: Secondary | ICD-10-CM | POA: Diagnosis not present

## 2023-09-04 DIAGNOSIS — E1169 Type 2 diabetes mellitus with other specified complication: Secondary | ICD-10-CM | POA: Diagnosis not present

## 2023-09-04 DIAGNOSIS — Z1331 Encounter for screening for depression: Secondary | ICD-10-CM | POA: Diagnosis not present

## 2023-09-04 DIAGNOSIS — R82998 Other abnormal findings in urine: Secondary | ICD-10-CM | POA: Diagnosis not present

## 2023-09-04 DIAGNOSIS — Z0000 Encounter for general adult medical examination without abnormal findings: Secondary | ICD-10-CM | POA: Diagnosis not present

## 2023-09-04 DIAGNOSIS — Z23 Encounter for immunization: Secondary | ICD-10-CM | POA: Diagnosis not present

## 2023-09-04 DIAGNOSIS — C50919 Malignant neoplasm of unspecified site of unspecified female breast: Secondary | ICD-10-CM | POA: Diagnosis not present

## 2023-09-08 ENCOUNTER — Ambulatory Visit: Admitting: Gastroenterology

## 2023-09-08 ENCOUNTER — Encounter: Payer: Self-pay | Admitting: Gastroenterology

## 2023-09-08 VITALS — BP 110/70 | HR 72 | Ht 62.0 in | Wt 182.4 lb

## 2023-09-08 DIAGNOSIS — K219 Gastro-esophageal reflux disease without esophagitis: Secondary | ICD-10-CM

## 2023-09-08 DIAGNOSIS — K580 Irritable bowel syndrome with diarrhea: Secondary | ICD-10-CM | POA: Diagnosis not present

## 2023-09-08 DIAGNOSIS — E119 Type 2 diabetes mellitus without complications: Secondary | ICD-10-CM

## 2023-09-08 DIAGNOSIS — Z860100 Personal history of colon polyps, unspecified: Secondary | ICD-10-CM | POA: Diagnosis not present

## 2023-09-08 DIAGNOSIS — Z7985 Long-term (current) use of injectable non-insulin antidiabetic drugs: Secondary | ICD-10-CM | POA: Diagnosis not present

## 2023-09-08 DIAGNOSIS — Z79899 Other long term (current) drug therapy: Secondary | ICD-10-CM

## 2023-09-08 MED ORDER — NA SULFATE-K SULFATE-MG SULF 17.5-3.13-1.6 GM/177ML PO SOLN: 1.0000 | ORAL | 0 refills | Status: DC

## 2023-09-08 NOTE — Progress Notes (Signed)
 Chief Complaint: Colon polyp surveillance   Referring Provider:     Charlane Ferretti, DO    HPI:     Tara Davis is a 78 y.o. female with history of breast cancer diagnosed 02/2022  s/p lumpectomy and now on anastrozole, diabetes, depression, tobacco use, cholecystectomy, referred to the [facility] for evaluation of colonoscopy for ongoing polyp surveillance.  Does have chronic non-bloody diarrhea.  Symptoms have been present for many years.  Generally well controlled with fiber supplement daily and imodium prn (takes about 3 times/week). This has been evaluated by Dr. Christella Hartigan and by [facility] in the past. She did cut out dairy for the last week to see if that will improve sxs.    She was last seen in this office by Doug Sou in 08/2019.  Since then, she has been evaluated by Dr. Dulce Sellar at [facility] and by Dr. Donnal Debar at [facility], last seen there in 03/2021.  She would like to reestablish her care here.  Last colonoscopy was 2018 as below (4 subcm adenomas).   Additionally, longstanding history of reflux, which is well-controlled with Prilosec 40 mg daily. No dysphagia.   Reviewed most recent available labs from 04/2023: Normal CBC.  BUN/creatinine 26/1.1, otherwise normal CMP (BG 153).  No recent abdominal imaging to review.   Endoscopic History: - 04/2010: EGD: H. pylori gastritis - 11/2016: EGD: Normal  - 10/2009: Colonoscopy: Small adenomas removed, diverticulosis, hemorrhoids - 04/2010: Colonoscopy: 3 small adenomas - 11/2016: Colonoscopy: four small polyps removed from the transverse and sigmoid colon, histology serrated and hyperplastic polyps; random biopsies were normal; left-sided diverticulosis  No known family history of CRC, GI malignancy, liver disease, pancreatic disease, or IBD.     Past Medical History:  Diagnosis Date   Adenomatous colon polyp    Anxiety    Arthritis    Bilateral cataracts    within 6 mos.   Depression     Diverticulosis    Fatty liver    GERD (gastroesophageal reflux disease)    H. pylori infection    Hyperlipidemia    IBS (irritable bowel syndrome)    Ulcer 1988   Uterine fibroid      Past Surgical History:  Procedure Laterality Date   APPENDECTOMY     BREAST LUMPECTOMY WITH RADIOACTIVE SEED LOCALIZATION Left 03/12/2022   Procedure: LEFT BREAST LUMPECTOMY WITH RADIOACTIVE SEED LOCALIZATION;  Surgeon: Almond Lint, MD;  Location: [facility] OR;  Service: General;  Laterality: Left;   CATARACT EXTRACTION Bilateral    CATARACT EXTRACTION, BILATERAL     CHOLECYSTECTOMY     COLONOSCOPY     spontaneous vaginal delivery     x2   TONSILLECTOMY     TOTAL KNEE ARTHROPLASTY Left 10/11/2021   Procedure: LEFT TOTAL KNEE ARTHROPLASTY;  Surgeon: Nadara Mustard, MD;  Location: [facility] OR;  Service: Orthopedics;  Laterality: Left;   WISDOM TOOTH EXTRACTION     Family History  Problem Relation Age of Onset   Alzheimer's disease Mother    Hyperlipidemia Mother    Depression Father    Diabetes Father    Breast cancer Sister 72   Alzheimer's disease Maternal Grandmother    Lung cancer Paternal Grandmother    Colon cancer Neg Hx    Esophageal cancer Neg Hx    Stomach cancer Neg Hx    Social History   Tobacco Use   Smoking status: Every Day  Current packs/day: 1.00    Average packs/day: 1 pack/day for 60.0 years (60.0 ttl pk-yrs)    Types: Cigarettes   Smokeless tobacco: Never  Vaping Use   Vaping status: Never Used  Substance Use Topics   Alcohol use: Yes    Alcohol/week: 10.0 standard drinks of alcohol    Types: 10 Shots of liquor per week    Comment: social   Drug use: No   Current Outpatient Medications  Medication Sig Dispense Refill   anastrozole (ARIMIDEX) 1 MG tablet TAKE 1 TABLET BY MOUTH DAILY 30 tablet 5   atorvastatin (LIPITOR) 20 MG tablet TAKE ONE TABLET BY MOUTH DAILY 76 tablet 1   Bacillus Coagulans-Inulin (BENEFIBER PREBIOTIC+PROBIOTIC PO) Take 1 Scoop by mouth daily.      dicyclomine (BENTYL) 10 MG capsule Take 10 mg by mouth in the morning and at bedtime.     loperamide (IMODIUM A-D) 2 MG tablet Take 2 mg by mouth as needed for diarrhea or loose stools.     LORazepam (ATIVAN) 0.5 MG tablet Take 1 mg by mouth 2 (two) times daily.     nicotine (NICODERM CQ - DOSED IN MG/24 HOURS) 21 mg/24hr patch Place 21 mg onto the skin daily as needed (nicotine).     omeprazole (PRILOSEC) 40 MG capsule Take 40 mg by mouth daily.     ondansetron (ZOFRAN) 4 MG tablet Take 1 tablet (4 mg total) by mouth every 8 (eight) hours as needed for nausea. 8 tablet 5   Semaglutide (RYBELSUS) 3 MG TABS 3 mg.     vortioxetine HBr (TRINTELLIX) 20 MG TABS tablet Take 20 mg by mouth daily.     No current facility-administered medications for this visit.   Allergies  Allergen Reactions   Amoxicillin-Pot Clavulanate Itching   Codeine Other (See Comments)    REACTION: hallucinations   Penicillins Itching     Review of Systems: All systems reviewed and negative except where noted in HPI.     Physical Exam:    Wt Readings from Last 3 Encounters:  09/08/23 182 lb 6 oz (82.7 kg)  04/22/23 193 lb 4.8 oz (87.7 kg)  10/20/22 198 lb 1.6 oz (89.9 kg)    BP 110/70 (BP Location: Left Arm, Patient Position: Sitting, Cuff Size: Normal)   Pulse 72   Ht 5\' 2"  (1.575 m) Comment: height measured without shoes  Wt 182 lb 6 oz (82.7 kg)   BMI 33.36 kg/m  Constitutional:  Pleasant, in no acute distress. Psychiatric: Normal mood and affect. Behavior is normal. Cardiovascular: Normal rate, regular rhythm. No edema Pulmonary/chest: Effort normal and breath sounds normal. No wheezing, rales or rhonchi. Abdominal: Soft, nondistended, nontender. Bowel sounds active throughout. There are no masses palpable. No hepatomegaly. Neurological: Alert and oriented to person place and time. Skin: Skin is warm and dry. No rashes noted.   ASSESSMENT AND PLAN;   1) History of colon polyps Due for repeat  colonoscopy for ongoing polyp surveillance. - Schedule colonoscopy  2) Diabetes 3) Medication management -Hold semaglutide 1 day prior to bowel prep (she is on daily dosing). Will d/w PCM if she needs any other DM management while off therapy.  4) GERD - Well-controlled on current therapy - Resume Prilosec 40 mg daily - Continue antireflux lifestyle/dietary modifications  5) IBS-D Longstanding history of chronic, watery, nonbloody stools.  Symptoms generally well-controlled on current therapy.  This has been extensively evaluated in the past and attributed to IBS-D vs bile salt diarrhea. - Continue daily  fiber supplement - Continue Imodium on demand - Recently cut out dairy to see if that makes a difference.  Depending on response, can also trial low FODMAP diet and other dietary modification/dietary - Can evaluate for any additional Nikkel/luminal pathology at time of colonoscopy as above  The indications, risks, and benefits of colonoscopy were explained to the patient in detail. Risks include but are not limited to bleeding, perforation, adverse reaction to medications, and cardiopulmonary compromise. Sequelae include but are not limited to the possibility of surgery, hospitalization, and mortality. The patient verbalized understanding and wished to proceed. All questions answered, referred to the scheduler and bowel prep ordered. Further recommendations pending results of the exam.     Shellia Cleverly, DO, FACG  09/08/2023, 11:12 AM   Charlane Ferretti, DO

## 2023-09-08 NOTE — Patient Instructions (Addendum)
 _______________________________________________________  If your blood pressure at your visit was 140/90 or greater, please contact your primary care physician to follow up on this. _______________________________________________________  If you are age 77 or older, your body mass index should be between 23-30. Your Body mass index is 33.36 kg/m. If this is out of the aforementioned range listed, please consider follow up with your Primary Care Provider. ________________________________________________________  The [facility] providers would like to encourage you to use [facility] to communicate with providers for non-urgent requests or questions.  Due to long hold times on the telephone, sending your provider a message by [facility] may be a faster and more efficient way to get a response.  Please allow 48 business hours for a response.  Please remember that this is for non-urgent requests.  _______________________________________________________  [name] have been scheduled for a colonoscopy. Please follow written instructions given to you at your visit today.   If you use inhalers (even only as needed), please bring them with you on the day of your procedure.  DO NOT TAKE 7 DAYS PRIOR TO TEST- Trulicity (dulaglutide) Ozempic, Wegovy (semaglutide) Mounjaro (tirzepatide) Bydureon Bcise (exanatide extended release)  DO NOT TAKE 1 DAY PRIOR TO YOUR TEST Rybelsus (semaglutide) Adlyxin (lixisenatide) Victoza (liraglutide) Byetta (exanatide)  Due to recent changes in healthcare laws, you may see the results of your imaging and laboratory studies on MyChart before your provider has had a chance to review them.  We understand that in some cases there may be results that are confusing or concerning to you. Not all laboratory results come back in the same time frame and the provider may be waiting for multiple results in order to interpret others.  Please give [place] 48 hours in order for your provider to  thoroughly review all the results before contacting the office for clarification of your results.   It was a pleasure to see you today!  Doristine Locks, D.O.  _

## 2023-09-14 ENCOUNTER — Encounter: Payer: Self-pay | Admitting: Student-PharmD

## 2023-09-14 DIAGNOSIS — F331 Major depressive disorder, recurrent, moderate: Secondary | ICD-10-CM | POA: Diagnosis not present

## 2023-09-14 NOTE — Progress Notes (Signed)
 Patient has been approved to receive Rybelsus assistance through [employer] in 2025. Medication will be shipped to PCP office and patient will be notified once it arrives.  Jeanella Craze, PharmD Clinical Pharmacist [phone]

## 2023-09-22 DIAGNOSIS — L282 Other prurigo: Secondary | ICD-10-CM | POA: Diagnosis not present

## 2023-09-24 ENCOUNTER — Telehealth: Payer: Self-pay | Admitting: Gastroenterology

## 2023-09-24 DIAGNOSIS — L282 Other prurigo: Secondary | ICD-10-CM | POA: Diagnosis not present

## 2023-09-24 DIAGNOSIS — E1169 Type 2 diabetes mellitus with other specified complication: Secondary | ICD-10-CM | POA: Diagnosis not present

## 2023-09-24 NOTE — Telephone Encounter (Signed)
 Called and spoke with patient. Patient reports that she has continued taking Dicyclomine 10 mg BID, Imodium PRN, and Benefiber daily. Patient is now experiencing constipation, straining, and passing hard stool. Patient had a BM yesterday and did not feel like she evacuated completely. Pt reports abdominal pain and bloating due to constipation. Patient states that the Dicyclomine is not helping anymore, I advised pt to hold this prescription for a few days as this could contribute to constipation. Patient has Senokot at home and will take this, she will also purchase Miralax OTC and titrate dose PRN. Pt has been advised to hold Imodium as well. Pt is aware that we are closed tomorrow but there are on call providers if she has any concerns over the weekend. Pt verbalized understanding and had no concerns at the end of the call.

## 2023-09-24 NOTE — Telephone Encounter (Signed)
 Patient called and stated that she is having a colonoscopy soon but she has noticed a change in her IBS symptoms and was wonder if she can speak to a nurse regarding her Cramping and constipation along with bloating. Patient stated that she is feeling very uncomfortable. Patient is requesting a call back. Please advise.

## 2023-09-26 ENCOUNTER — Other Ambulatory Visit: Payer: Self-pay | Admitting: Hematology

## 2023-10-15 ENCOUNTER — Telehealth: Payer: Self-pay

## 2023-10-15 NOTE — Telephone Encounter (Signed)
-----   Message from [facility] sent at 10/15/2023  1:29 PM EDT ----- Regarding: RE: Low dose CT Thanks ----- Message ----- From: Kay Parson, LPN Sent: 12/13/8240  10:34 AM EDT To: Brandi Harvell; Percival Brace, NP; # Subject: RE: Low dose CT                                Thank you for letting us  know. Central scheduling is going to reach out to r/s patient and I will r/s MD f/u.   Rice Chamorro ----- Message ----- From: Hillary Lowing Sent: 10/15/2023   9:16 AM EDT To: Percival Brace, NP; [facility] Subject: Low dose CT                                    Good morning - the patients insurance only covers one low dose CT a year. The last one was 10/26/22.  Can we re-schedule for after 5/19 this year?  Thanks,  [employer]

## 2023-10-15 NOTE — Telephone Encounter (Signed)
 Pt is aware and knows to await call from [employer]. We will also have our schedulers r/s pt with Dr Maryalice Smaller after scan is scheduled.

## 2023-10-16 ENCOUNTER — Telehealth: Payer: Self-pay | Admitting: Hematology

## 2023-10-19 ENCOUNTER — Encounter: Payer: Self-pay | Admitting: Gastroenterology

## 2023-10-19 ENCOUNTER — Telehealth: Payer: Self-pay | Admitting: *Deleted

## 2023-10-19 NOTE — Telephone Encounter (Signed)
 Per insurance benefit- low dose CT of chest rescheduled to post 10/26/2023 - now scheduled for 10/28/2023.  Called pt and informed her of time and location per above.  No further needs at this time.

## 2023-10-20 ENCOUNTER — Ambulatory Visit (HOSPITAL_COMMUNITY)

## 2023-10-20 DIAGNOSIS — K080 Exfoliation of teeth due to systemic causes: Secondary | ICD-10-CM | POA: Diagnosis not present

## 2023-10-21 ENCOUNTER — Ambulatory Visit: Payer: Medicare Other | Admitting: Hematology

## 2023-10-21 ENCOUNTER — Other Ambulatory Visit: Payer: Medicare Other

## 2023-10-22 ENCOUNTER — Telehealth: Payer: Self-pay | Admitting: Gastroenterology

## 2023-10-22 NOTE — Telephone Encounter (Signed)
 Patient called and stated that she would like to speak to Dr. Karene Oto nurse. Patient did not want to relay or provide anymore information then that. Patient is requesting a call back. Please advise.

## 2023-10-22 NOTE — Telephone Encounter (Signed)
 Returned call to patient. Patient needed to clarify a few instructions for her upcoming procedure. Patient wanted to make sure that she is OK to proceed with CT scan of chest the day after procedure, I advised patient that she can resume normal activity the following day and fine to proceed. Patient also had question about Rybelsus prescription. Patient was told that she will need to hold 1 week, diabetic instructions states that patient only needs to hold the day of the procedure since it is an oral med. Patient verbalized understanding and had no concerns at the end of the call.

## 2023-10-27 ENCOUNTER — Ambulatory Visit (AMBULATORY_SURGERY_CENTER): Admitting: Gastroenterology

## 2023-10-27 ENCOUNTER — Encounter: Payer: Self-pay | Admitting: Gastroenterology

## 2023-10-27 VITALS — BP 120/55 | HR 70 | Temp 98.0°F | Resp 15 | Ht 62.0 in | Wt 182.0 lb

## 2023-10-27 DIAGNOSIS — Z1211 Encounter for screening for malignant neoplasm of colon: Secondary | ICD-10-CM | POA: Diagnosis not present

## 2023-10-27 DIAGNOSIS — D123 Benign neoplasm of transverse colon: Secondary | ICD-10-CM

## 2023-10-27 DIAGNOSIS — K5730 Diverticulosis of large intestine without perforation or abscess without bleeding: Secondary | ICD-10-CM | POA: Diagnosis not present

## 2023-10-27 DIAGNOSIS — K635 Polyp of colon: Secondary | ICD-10-CM | POA: Diagnosis not present

## 2023-10-27 DIAGNOSIS — D125 Benign neoplasm of sigmoid colon: Secondary | ICD-10-CM

## 2023-10-27 DIAGNOSIS — D120 Benign neoplasm of cecum: Secondary | ICD-10-CM

## 2023-10-27 DIAGNOSIS — K644 Residual hemorrhoidal skin tags: Secondary | ICD-10-CM | POA: Diagnosis not present

## 2023-10-27 DIAGNOSIS — Z860100 Personal history of colon polyps, unspecified: Secondary | ICD-10-CM

## 2023-10-27 DIAGNOSIS — K641 Second degree hemorrhoids: Secondary | ICD-10-CM

## 2023-10-27 MED ORDER — SODIUM CHLORIDE 0.9 % IV SOLN: 500.0000 mL | Freq: Once | INTRAVENOUS | Status: DC

## 2023-10-27 NOTE — Op Note (Signed)
 [facility] Patient Name: Tara Davis Procedure Date: 10/27/2023 11:19 AM MRN: [id number] Endoscopist: Harry Lindau , MD, [id number] Age: 78 Referring MD:  Date of Birth: 1946/05/09 Gender: Female Account #: [id number] Procedure:                Colonoscopy Indications:              High risk colon cancer surveillance: Personal                            history of sessile serrated colon polyps and                            adenomas (less than 10 mm in size).                           -10/2009: Colonoscopy: Small adenomas removed,                            diverticulosis, hemorrhoids                           -04/2010: Colonoscopy: 3 small adenomas                           -11/2016: Colonoscopy: four small polyps removed                            from the transverse and sigmoid colon, histology                            serrated and hyperplastic polyps; random biopsies                            were normal; left-sided diverticulosis Medicines:                Monitored Anesthesia Care Procedure:                Pre-Anesthesia Assessment:                           - Prior to the procedure, a History and Physical                            was performed, and patient medications and                            allergies were reviewed. The patient's tolerance of                            previous anesthesia was also reviewed. The risks                            and benefits of the procedure and the sedation                            options and risks were discussed  with the patient.                            All questions were answered, and informed consent                            was obtained. Prior Anticoagulants: The patient has                            taken no anticoagulant or antiplatelet agents. ASA                            Grade Assessment: III - A patient with severe                            systemic disease. After reviewing the risks and                             benefits, the patient was deemed in satisfactory                            condition to undergo the procedure.                           After obtaining informed consent, the colonoscope                            was passed under direct vision. Throughout the                            procedure, the patient's blood pressure, pulse, and                            oxygen saturations were monitored continuously. The                            Olympus Scope SN: [id number] was introduced through                            the anus and advanced to the the cecum, identified                            by appendiceal orifice and ileocecal valve. The                            colonoscopy was performed without difficulty. The                            patient tolerated the procedure well. The quality                            of the bowel preparation was good. The ileocecal  valve, appendiceal orifice, and rectum were                            photographed. Scope In: 11:37:29 AM Scope Out: 11:53:13 AM Scope Withdrawal Time: 0 hours 10 minutes 30 seconds  Total Procedure Duration: 0 hours 15 minutes 44 seconds  Findings:                 Hemorrhoids were found on perianal exam.                           Three sessile polyps were found in the sigmoid                            colon (1) and transverse colon (2). The polyps were                            3 to 6 mm in size. These polyps were removed with a                            cold snare. Resection and retrieval were complete.                            Estimated blood loss was minimal.                           Multiple large-mouthed and small-mouthed                            diverticula were found in the sigmoid colon and                            distal descending colon.                           Non-bleeding internal hemorrhoids were found during                            retroflexion. The  hemorrhoids were small and Grade                            II (internal hemorrhoids that prolapse but reduce                            spontaneously). Complications:            No immediate complications. Estimated Blood Loss:     Estimated blood loss was minimal. Impression:               - Hemorrhoids found on perianal exam.                           - Three 3 to 6 mm polyps in the sigmoid colon and                            in the transverse  colon, removed with a cold snare.                            Resected and retrieved.                           - Diverticulosis in the sigmoid colon and in the                            distal descending colon.                           - Non-bleeding internal hemorrhoids. Recommendation:           - Patient has a contact number available for                            emergencies. The signs and symptoms of potential                            delayed complications were discussed with the                            patient. Return to normal activities tomorrow.                            Written discharge instructions were provided to the                            patient.                           - Resume previous diet.                           - Continue present medications.                           - Await pathology results.                           - Repeat colonoscopy for surveillance based on                            pathology results.                           - Return to GI clinic PRN. Harry Lindau, MD 10/27/2023 11:58:31 AM

## 2023-10-27 NOTE — Patient Instructions (Signed)
 Resume previous diet and medications. Awaiting pathology results.  Return to the GI office as needed. Handouts provided on Colon polyps, Diverticulosis and Hemorrhoids  YOU HAD AN ENDOSCOPIC PROCEDURE TODAY AT THE [facility]:   Refer to the procedure report that was given to you for any specific questions about what was found during the examination.  If the procedure report does not answer your questions, please call your gastroenterologist to clarify.  If you requested that your care partner not be given the details of your procedure findings, then the procedure report has been included in a sealed envelope for you to review at your convenience later.  YOU SHOULD EXPECT: Some feelings of bloating in the abdomen. Passage of more gas than usual.  Walking can help get rid of the air that was put into your GI tract during the procedure and reduce the bloating. If you had a lower endoscopy (such as a colonoscopy or flexible sigmoidoscopy) you may notice spotting of blood in your stool or on the toilet paper. If you underwent a bowel prep for your procedure, you may not have a normal bowel movement for a few days.  Please Note:  You might notice some irritation and congestion in your nose or some drainage.  This is from the oxygen used during your procedure.  There is no need for concern and it should clear up in a day or so.  SYMPTOMS TO REPORT IMMEDIATELY:  Following lower endoscopy (colonoscopy or flexible sigmoidoscopy):  Excessive amounts of blood in the stool  Significant tenderness or worsening of abdominal pains  Swelling of the abdomen that is new, acute  Fever of 100F or higher  For urgent or emergent issues, a gastroenterologist can be reached at any hour by calling [phone]. Do not use MyChart messaging for urgent concerns.    DIET:  We do recommend a small meal at first, but then you may proceed to your regular diet.  Drink plenty of fluids but you should avoid  alcoholic beverages for 24 hours.  ACTIVITY:  You should plan to take it easy for the rest of today and you should NOT DRIVE or use heavy machinery until tomorrow (because of the sedation medicines used during the test).    FOLLOW UP: Our staff will call the number listed on your records the next business day following your procedure.  We will call around 7:15- 8:00 am to check on you and address any questions or concerns that you may have regarding the information given to you following your procedure. If we do not reach you, we will leave a message.     If any biopsies were taken you will be contacted by phone or by letter within the next 1-3 weeks.  Please call us  at [phone] if you have not heard about the biopsies in 3 weeks.    SIGNATURES/CONFIDENTIALITY: You and/or your care partner have signed paperwork which will be entered into your electronic medical record.  These signatures attest to the fact that that the information above on your After Visit Summary has been reviewed and is understood.  Full responsibility of the confidentiality of this discharge information lies with you and/or your care-partner.

## 2023-10-27 NOTE — Progress Notes (Signed)
 Pt A/O x 3, gd SR's, pleased with anesthesia, report to RN

## 2023-10-27 NOTE — Progress Notes (Signed)
 Pt's states no medical or surgical changes since previsit or office visit.

## 2023-10-27 NOTE — Progress Notes (Signed)
 Called to room to assist during endoscopic procedure.  Patient ID and intended procedure confirmed with present staff. Received instructions for my participation in the procedure from the performing physician.

## 2023-10-27 NOTE — Progress Notes (Signed)
 GASTROENTEROLOGY PROCEDURE H&P NOTE   Primary Care Physician: Windell Hasty, DO    Reason for Procedure:  Colon polyp surveillance  Plan:    Colonoscopy  Patient is appropriate for endoscopic procedure(s) in the ambulatory ([facility]) setting.  The nature of the procedure, as well as the risks, benefits, and alternatives were carefully and thoroughly reviewed with the patient. Ample time for discussion and questions allowed. The patient understood, was satisfied, and agreed to proceed.     HPI: Tara Davis is a 78 y.o. female who presents for colonoscopy for ongoing colon polyp surveillance and colon cancer screening.  No active GI symptoms.    Last colonoscopy was 11/2016 and notable for 4 subcentimeter polyps.   Endoscopic History: - 04/2010: EGD: H. pylori gastritis - 11/2016: EGD: Normal   - 10/2009: Colonoscopy: Small adenomas removed, diverticulosis, hemorrhoids - 04/2010: Colonoscopy: 3 small adenomas - 11/2016: Colonoscopy: four small polyps removed from the transverse and sigmoid colon, histology serrated and hyperplastic polyps; random biopsies were normal; left-sided diverticulosis   No known family history of CRC, GI malignancy, liver disease, pancreatic disease, or IBD.   Past Medical History:  Diagnosis Date   Adenomatous colon polyp    Anxiety    Arthritis    Bilateral cataracts    within 6 mos.   Depression    Diverticulosis    Fatty liver    GERD (gastroesophageal reflux disease)    H. pylori infection    Hyperlipidemia    IBS (irritable bowel syndrome)    Ulcer 1988   Uterine fibroid     Past Surgical History:  Procedure Laterality Date   APPENDECTOMY     BREAST LUMPECTOMY WITH RADIOACTIVE SEED LOCALIZATION Left 03/12/2022   Procedure: LEFT BREAST LUMPECTOMY WITH RADIOACTIVE SEED LOCALIZATION;  Surgeon: Lockie Rima, MD;  Location: [facility] OR;  Service: General;  Laterality: Left;   CATARACT EXTRACTION Bilateral    CATARACT EXTRACTION, BILATERAL      CHOLECYSTECTOMY     COLONOSCOPY     spontaneous vaginal delivery     x2   TONSILLECTOMY     TOTAL KNEE ARTHROPLASTY Left 10/11/2021   Procedure: LEFT TOTAL KNEE ARTHROPLASTY;  Surgeon: Timothy Ford, MD;  Location: [facility] OR;  Service: Orthopedics;  Laterality: Left;   WISDOM TOOTH EXTRACTION      Prior to Admission medications   Medication Sig Start Date End Date Taking? Authorizing Provider  anastrozole  (ARIMIDEX ) 1 MG tablet TAKE 1 TABLET BY MOUTH DAILY 09/28/23  Yes Sonja Killbuck, MD  atorvastatin  (LIPITOR) 20 MG tablet TAKE ONE TABLET BY MOUTH DAILY 12/04/21  Yes Cleave Curling, MD  Bacillus Coagulans-Inulin (BENEFIBER PREBIOTIC+PROBIOTIC PO) Take 1 Scoop by mouth daily. 09/30/21  Yes [provider]  loperamide (IMODIUM A-D) 2 MG tablet Take 2 mg by mouth as needed for diarrhea or loose stools. 09/30/21  Yes [provider]  LORazepam  (ATIVAN ) 0.5 MG tablet Take 1 mg by mouth 2 (two) times daily. 04/28/12  Yes [provider]  omeprazole  (PRILOSEC) 40 MG capsule Take 40 mg by mouth daily. 03/02/20  Yes [provider]  Semaglutide (RYBELSUS) 3 MG TABS 3 mg. 09/10/22  Yes [provider]  vortioxetine  HBr (TRINTELLIX ) 20 MG TABS tablet Take 20 mg by mouth daily. 03/02/20  Yes [provider]  dicyclomine  (BENTYL ) 10 MG capsule Take 10 mg by mouth in the morning and at bedtime. Patient not taking: Reported on 10/27/2023 09/16/21   [provider]  nicotine (NICODERM CQ -  DOSED IN MG/24 HOURS) 21 mg/24hr patch Place 21 mg onto the skin daily as needed (nicotine). Patient not taking: Reported on 10/27/2023 09/30/21   [provider]  ondansetron  (ZOFRAN ) 4 MG tablet Take 1 tablet (4 mg total) by mouth every 8 (eight) hours as needed for nausea. 03/12/22   Lockie Rima, MD    Current Outpatient Medications  Medication Sig Dispense Refill   anastrozole  (ARIMIDEX ) 1 MG tablet TAKE 1 TABLET BY MOUTH DAILY 30 tablet 5   atorvastatin   (LIPITOR) 20 MG tablet TAKE ONE TABLET BY MOUTH DAILY 76 tablet 1   Bacillus Coagulans-Inulin (BENEFIBER PREBIOTIC+PROBIOTIC PO) Take 1 Scoop by mouth daily.     loperamide (IMODIUM A-D) 2 MG tablet Take 2 mg by mouth as needed for diarrhea or loose stools.     LORazepam  (ATIVAN ) 0.5 MG tablet Take 1 mg by mouth 2 (two) times daily.     omeprazole  (PRILOSEC) 40 MG capsule Take 40 mg by mouth daily.     Semaglutide (RYBELSUS) 3 MG TABS 3 mg.     vortioxetine  HBr (TRINTELLIX ) 20 MG TABS tablet Take 20 mg by mouth daily.     dicyclomine  (BENTYL ) 10 MG capsule Take 10 mg by mouth in the morning and at bedtime. (Patient not taking: Reported on 10/27/2023)     nicotine (NICODERM CQ - DOSED IN MG/24 HOURS) 21 mg/24hr patch Place 21 mg onto the skin daily as needed (nicotine). (Patient not taking: Reported on 10/27/2023)     ondansetron  (ZOFRAN ) 4 MG tablet Take 1 tablet (4 mg total) by mouth every 8 (eight) hours as needed for nausea. 8 tablet 5   Current Facility-Administered Medications  Medication Dose Route Frequency Provider Last Rate Last Admin   0.9 %  sodium chloride  infusion  500 mL Intravenous Once Jostin Rue V, DO        Allergies as of 10/27/2023 - Review Complete 10/27/2023  Allergen Reaction Noted   Trazodone hcl Hives 10/27/2023   Amoxicillin -pot clavulanate Itching 06/28/2020   Codeine Other (See Comments) 10/29/2009   Penicillins Itching 06/14/2020    Family History  Problem Relation Age of Onset   Alzheimer's disease Mother    Hyperlipidemia Mother    Depression Father    Diabetes Father    Breast cancer Sister 15   Alzheimer's disease Maternal Grandmother    Lung cancer Paternal Grandmother    Colon cancer Neg Hx    Esophageal cancer Neg Hx    Stomach cancer Neg Hx     Social History   Socioeconomic History   Marital status: Single    Spouse name: Not on file   Number of children: 2   Years of education: 16   Highest education level: Bachelor's degree  (e.g., BA, AB, BS)  Occupational History   Occupation: self employed  Tobacco Use   Smoking status: Every Day    Current packs/day: 1.00    Average packs/day: 1 pack/day for 60.0 years (60.0 ttl pk-yrs)    Types: Cigarettes   Smokeless tobacco: Never  Vaping Use   Vaping status: Never Used  Substance and Sexual Activity   Alcohol use: Yes    Alcohol/week: 10.0 standard drinks of alcohol    Types: 10 Shots of liquor per week    Comment: social   Drug use: No   Sexual activity: Not Currently  Other Topics Concern   Not on file  Social History Narrative   Patient is right-handed. She lives with her long time  boyfriend of 25+ years ina 2 level home. She drinks green tea, 2-4 cups a day. She does not exercise.   Social Drivers of Corporate investment banker Strain: Low Risk  (06/14/2020)   Overall Financial Resource Strain (CARDIA)    Difficulty of Paying Living Expenses: Not hard at all  Food Insecurity: No Food Insecurity (06/14/2020)   Hunger Vital Sign    Worried About Running Out of Food in the Last Year: Never true    Ran Out of Food in the Last Year: Never true  Transportation Needs: No Transportation Needs (06/14/2020)   PRAPARE - Administrator, Civil Service (Medical): No    Lack of Transportation (Non-Medical): No  Physical Activity: Inactive (06/14/2020)   Exercise Vital Sign    Days of Exercise per Week: 0 days    Minutes of Exercise per Session: 0 min  Stress: No Stress Concern Present (06/14/2020)   [employer] of Occupational Health - Occupational Stress Questionnaire    Feeling of Stress : Not at all  Social Connections: Not on file  Intimate Partner Violence: Not on file    Physical Exam: Vital signs in last 24 hours: @BP  (!) 140/72   Pulse 76   Temp 98 F (36.7 C)   Resp 16   Ht 5\' 2"  (1.575 m)   Wt 182 lb (82.6 kg)   SpO2 98%   BMI 33.29 kg/m  GEN: NAD EYE: Sclerae anicteric ENT: MMM CV: Non-tachycardic Pulm: CTA b/l GI: Soft,  NT/ND NEURO:  Alert & Oriented x 3   Harry Lindau, DO [facility]   10/27/2023 11:29 AM

## 2023-10-28 ENCOUNTER — Ambulatory Visit (HOSPITAL_COMMUNITY)
Admission: RE | Admit: 2023-10-28 | Discharge: 2023-10-28 | Disposition: A | Discharge status: Home / Self Care | Source: Ambulatory Visit | Attending: Adult Health | Admitting: Adult Health

## 2023-10-28 ENCOUNTER — Telehealth: Payer: Self-pay

## 2023-10-28 DIAGNOSIS — J439 Emphysema, unspecified: Secondary | ICD-10-CM | POA: Diagnosis not present

## 2023-10-28 DIAGNOSIS — F17210 Nicotine dependence, cigarettes, uncomplicated: Secondary | ICD-10-CM | POA: Diagnosis not present

## 2023-10-28 DIAGNOSIS — Z122 Encounter for screening for malignant neoplasm of respiratory organs: Secondary | ICD-10-CM | POA: Diagnosis not present

## 2023-10-28 DIAGNOSIS — Z720 Tobacco use: Secondary | ICD-10-CM | POA: Diagnosis not present

## 2023-10-28 DIAGNOSIS — I2510 Atherosclerotic heart disease of native coronary artery without angina pectoris: Secondary | ICD-10-CM | POA: Insufficient documentation

## 2023-10-28 DIAGNOSIS — I700 Atherosclerosis of aorta: Secondary | ICD-10-CM | POA: Insufficient documentation

## 2023-10-28 NOTE — Telephone Encounter (Signed)
 Left message

## 2023-10-29 LAB — SURGICAL PATHOLOGY

## 2023-10-30 ENCOUNTER — Ambulatory Visit: Payer: Self-pay | Admitting: Gastroenterology

## 2023-11-03 NOTE — Assessment & Plan Note (Signed)
Stage IA, pT1a c(N0M0), ER+/PR+/HER2-, Grade 2 -found on screening mammogram. S/p left lumpectomy 03/12/22 by Dr. Donell Beers, path showed 5mm IDC and DCIS. -given small tumor size, she and Dr. Mitzi Hansen agreed to forego radiation therapy. -she started adjuvant anastrozole in 04/2022

## 2023-11-04 ENCOUNTER — Inpatient Hospital Stay: Attending: Adult Health

## 2023-11-04 ENCOUNTER — Inpatient Hospital Stay (HOSPITAL_BASED_OUTPATIENT_CLINIC_OR_DEPARTMENT_OTHER): Admitting: Hematology

## 2023-11-04 VITALS — BP 118/58 | HR 91 | Temp 98.0°F | Resp 21 | Ht 62.0 in | Wt 177.1 lb

## 2023-11-04 DIAGNOSIS — Z170 Estrogen receptor positive status [ER+]: Secondary | ICD-10-CM

## 2023-11-04 DIAGNOSIS — Z1721 Progesterone receptor positive status: Secondary | ICD-10-CM | POA: Insufficient documentation

## 2023-11-04 DIAGNOSIS — F17210 Nicotine dependence, cigarettes, uncomplicated: Secondary | ICD-10-CM | POA: Insufficient documentation

## 2023-11-04 DIAGNOSIS — Z79899 Other long term (current) drug therapy: Secondary | ICD-10-CM | POA: Diagnosis not present

## 2023-11-04 DIAGNOSIS — Z1211 Encounter for screening for malignant neoplasm of colon: Secondary | ICD-10-CM | POA: Diagnosis not present

## 2023-11-04 DIAGNOSIS — C50412 Malignant neoplasm of upper-outer quadrant of left female breast: Secondary | ICD-10-CM | POA: Diagnosis not present

## 2023-11-04 DIAGNOSIS — Z79811 Long term (current) use of aromatase inhibitors: Secondary | ICD-10-CM | POA: Diagnosis not present

## 2023-11-04 DIAGNOSIS — Z1732 Human epidermal growth factor receptor 2 negative status: Secondary | ICD-10-CM | POA: Insufficient documentation

## 2023-11-04 LAB — CBC WITH DIFFERENTIAL (CANCER CENTER ONLY)
Abs Immature Granulocytes: 0.02 10*3/uL (ref 0.00–0.07)
Basophils Absolute: 0.1 10*3/uL (ref 0.0–0.1)
Basophils Relative: 1 %
Eosinophils Absolute: 0.1 10*3/uL (ref 0.0–0.5)
Eosinophils Relative: 2 %
HCT: 39.8 % (ref 36.0–46.0)
Hemoglobin: 14 g/dL (ref 12.0–15.0)
Immature Granulocytes: 0 %
Lymphocytes Relative: 36 %
Lymphs Abs: 3 10*3/uL (ref 0.7–4.0)
MCH: 33.7 pg (ref 26.0–34.0)
MCHC: 35.2 g/dL (ref 30.0–36.0)
MCV: 95.9 fL (ref 80.0–100.0)
Monocytes Absolute: 0.4 10*3/uL (ref 0.1–1.0)
Monocytes Relative: 5 %
Neutro Abs: 4.7 10*3/uL (ref 1.7–7.7)
Neutrophils Relative %: 56 %
Platelet Count: 265 10*3/uL (ref 150–400)
RBC: 4.15 MIL/uL (ref 3.87–5.11)
RDW: 13.1 % (ref 11.5–15.5)
WBC Count: 8.3 10*3/uL (ref 4.0–10.5)
nRBC: 0 % (ref 0.0–0.2)

## 2023-11-04 LAB — CMP (CANCER CENTER ONLY)
ALT: 21 U/L (ref 0–44)
AST: 20 U/L (ref 15–41)
Albumin: 4 g/dL (ref 3.5–5.0)
Alkaline Phosphatase: 77 U/L (ref 38–126)
Anion gap: 10 (ref 5–15)
BUN: 17 mg/dL (ref 8–23)
CO2: 22 mmol/L (ref 22–32)
Calcium: 9.1 mg/dL (ref 8.9–10.3)
Chloride: 106 mmol/L (ref 98–111)
Creatinine: 0.75 mg/dL (ref 0.44–1.00)
GFR, Estimated: >60 mL/min (ref >60)
Glucose, Bld: 130 mg/dL — ABNORMAL HIGH (ref 70–99)
Potassium: 3.7 mmol/L (ref 3.5–5.1)
Sodium: 138 mmol/L (ref 135–145)
Total Bilirubin: 0.9 mg/dL (ref 0.0–1.2)
Total Protein: 6.7 g/dL (ref 6.5–8.1)

## 2023-11-04 NOTE — Progress Notes (Signed)
 [facility]   [phone] [phone]   Clinic Follow up Note   Patient Care Team: Windell Hasty, DO as PCP - General (Internal Medicine) Merriam Abbey, DO as Consulting Physician (Neurology) Lockie Rima, MD as Consulting Physician (General Surgery) Sonja [place], MD as Consulting Physician (Hematology) Johna Myers, MD as Consulting Physician (Radiation Oncology) Derenda Flax, MD as Consulting Physician (Gastroenterology)  Date of Service:  11/04/2023  CHIEF COMPLAINT: f/u of left breast cancer  CURRENT THERAPY:  Adjuvant anastrozole   Oncology History   Malignant neoplasm of upper-outer quadrant of left breast in female, estrogen receptor positive (HCC)  Stage IA, pT1a c(N0M0), ER+/PR+/HER2-, Grade 2 -found on screening mammogram. S/p left lumpectomy 03/12/22 by Dr. Cherlynn Cornfield, path showed 5mm IDC and DCIS. -given small tumor size, she and Dr. Jeryl Moris agreed to forego radiation therapy. -she started adjuvant anastrozole  in 04/2022  Assessment & Plan Breast cancer, early stage Early stage breast cancer, status post lumpectomy on the left side. She is on anastrozole  since November 2023, with no reported side effects such as hot flashes or joint pain. Bone density scan from September 2024 was normal. Mammogram is due in August 2025. - Continue anastrozole  therapy - Order mammogram for August 2025 - Schedule follow-up in six months  Tobacco use She continues to smoke approximately one pack per day. Lung changes noted on recent CT scan likely related to smoking, but no significant nodules observed. Awaiting formal radiology report. - Continue lung cancer screening  Plan - She is clinically doing well, exam was unremarkable, no clinical concern for recurrence - Continue adjuvant anastrozole , plan for total of 5 years - I personally reviewed her CT chest images, she will follow-up with the formal report.   SUMMARY OF ONCOLOGIC HISTORY: Oncology  History Overview Note   Cancer Staging  Malignant neoplasm of upper-outer quadrant of left breast in female, estrogen receptor positive (HCC) Staging form: Breast, AJCC 8th Edition - Clinical stage from 02/11/2022: Stage IA (cT1a, cN0, cM0, G2, ER+, PR+, HER2-) - Signed by Sonja [place], MD on 02/23/2022 Stage prefix: Initial diagnosis Method of lymph node assessment: Clinical Histologic grading system: 3 grade system - Pathologic stage from 03/12/2022: Stage Unknown (pT1a, pNX, cM0, G2, ER+, PR+, HER2-) - Signed by Sonja Rialto, MD on 04/21/2022 Stage prefix: Initial diagnosis Histologic grading system: 3 grade system Residual tumor (R): R0 - None     Malignant neoplasm of upper-outer quadrant of left breast in female, estrogen receptor positive (HCC)  02/11/2022 Initial Biopsy   Diagnosis Breast, left, needle core biopsy, 2 o'clock, 8cmfn - INVASIVE DUCTAL CARCINOMA, SEE NOTE - DUCTAL CARCINOMA IN SITU, INTERMEDIATE GRADE - TUBULE FORMATION: SCORE 3 - NUCLEAR PLEOMORPHISM: SCORE 2 - MITOTIC COUNT: SCORE 1 - TOTAL SCORE: 6 - OVERALL GRADE: 2 - LYMPHOVASCULAR INVASION: NOT IDENTIFIED - CANCER LENGTH: 0.3 CM - CALCIFICATIONS: NOT IDENTIFIED - OTHER FINDINGS: NONE   02/11/2022 Cancer Staging   Staging form: Breast, AJCC 8th Edition - Clinical stage from 02/11/2022: Stage IA (cT1a, cN0, cM0, G2, ER+, PR+, HER2-) - Signed by Sonja Woodall, MD on 02/23/2022 Stage prefix: Initial diagnosis Method of lymph node assessment: Clinical Histologic grading system: 3 grade system   02/17/2022 Initial Diagnosis   Malignant neoplasm of upper-outer quadrant of left breast in female, estrogen receptor positive (HCC)    Genetic Testing   [facility] Cancer[facility] was Negative. Report date is 03/01/2022.  The CancerNext gene panel offered by [facility] includes sequencing, rearrangement analysis, and RNA analysis for  the following 36 genes:   APC, ATM, AXIN2, BARD1, BMPR1A, BRCA1, BRCA2, BRIP1, CDH1, CDK4, CDKN2A, CHEK2,  DICER1, HOXB13, EPCAM, GREM1, MLH1, MSH2, MSH3, MSH6, MUTYH, NBN, NF1, NTHL1, PALB2, PMS2, POLD1, POLE, PTEN, RAD51C, RAD51D, RECQL, SMAD4, SMARCA4, STK11, and TP53.    03/12/2022 Cancer Staging   Staging form: Breast, AJCC 8th Edition - Pathologic stage from 03/12/2022: Stage Unknown (pT1a, pNX, cM0, G2, ER+, PR+, HER2-) - Signed by Sonja Cushman, MD on 04/21/2022 Stage prefix: Initial diagnosis Histologic grading system: 3 grade system Residual tumor (R): R0 - None   03/12/2022 Surgery   S/p left lumpectomy by Dr. Cherlynn Cornfield, path showed 5mm IDC and DCIS.    04/2022 -  Anti-estrogen oral therapy   Anastrozole       Discussed the use of AI scribe software for clinical note transcription with the patient, who gave verbal consent to proceed.  History of Present Illness Tara Davis is a 78 year old female with breast cancer who presents for follow-up.  She has been on anastrozole  since November 2023 following a lumpectomy for early-stage breast cancer. She reports no new issues in the past six months and experiences no side effects from anastrozole , such as hot flashes or joint stiffness. Her last mammogram was in August 2024, with the next one due in August 2025. A CT scan was performed on Oct 28, 2023, and she is awaiting the official report. Her bone density scan from September 2024 was normal, with the next scan due in 2026.  She has intentionally lost weight through dietary changes and increased exercise as part of her efforts to improve her health.  She continues to smoke about a pack of cigarettes a day despite previous attempts to quit. Lung cancer screening is necessary due to her smoking history.     All other systems were reviewed with the patient and are negative.  MEDICAL HISTORY:  Past Medical History:  Diagnosis Date   Adenomatous colon polyp    Anxiety    Arthritis    Bilateral cataracts    within 6 mos.   Depression    Diverticulosis    Fatty liver    GERD  (gastroesophageal reflux disease)    H. pylori infection    Hyperlipidemia    IBS (irritable bowel syndrome)    Ulcer 1988   Uterine fibroid     SURGICAL HISTORY: Past Surgical History:  Procedure Laterality Date   APPENDECTOMY     BREAST LUMPECTOMY WITH RADIOACTIVE SEED LOCALIZATION Left 03/12/2022   Procedure: LEFT BREAST LUMPECTOMY WITH RADIOACTIVE SEED LOCALIZATION;  Surgeon: Lockie Rima, MD;  Location: [facility] OR;  Service: General;  Laterality: Left;   CATARACT EXTRACTION Bilateral    CATARACT EXTRACTION, BILATERAL     CHOLECYSTECTOMY     COLONOSCOPY     spontaneous vaginal delivery     x2   TONSILLECTOMY     TOTAL KNEE ARTHROPLASTY Left 10/11/2021   Procedure: LEFT TOTAL KNEE ARTHROPLASTY;  Surgeon: Timothy Ford, MD;  Location: [facility] OR;  Service: Orthopedics;  Laterality: Left;   WISDOM TOOTH EXTRACTION      I have reviewed the social history and family history with the patient and they are unchanged from previous note.  ALLERGIES:  is allergic to trazodone hcl, amoxicillin -pot clavulanate, codeine, and penicillins.  MEDICATIONS:  Current Outpatient Medications  Medication Sig Dispense Refill   anastrozole  (ARIMIDEX ) 1 MG tablet TAKE 1 TABLET BY MOUTH DAILY 30 tablet 5   atorvastatin  (LIPITOR) 20 MG tablet TAKE  ONE TABLET BY MOUTH DAILY 76 tablet 1   Bacillus Coagulans-Inulin (BENEFIBER PREBIOTIC+PROBIOTIC PO) Take 1 Scoop by mouth daily.     loperamide (IMODIUM A-D) 2 MG tablet Take 2 mg by mouth as needed for diarrhea or loose stools.     LORazepam  (ATIVAN ) 0.5 MG tablet Take 1 mg by mouth 2 (two) times daily.     nicotine (NICODERM CQ - DOSED IN MG/24 HOURS) 21 mg/24hr patch Place 21 mg onto the skin daily as needed (nicotine).     omeprazole  (PRILOSEC) 40 MG capsule Take 40 mg by mouth daily.     ondansetron  (ZOFRAN ) 4 MG tablet Take 1 tablet (4 mg total) by mouth every 8 (eight) hours as needed for nausea. 8 tablet 5   Semaglutide (RYBELSUS) 3 MG TABS 3 mg.      vortioxetine  HBr (TRINTELLIX ) 20 MG TABS tablet Take 20 mg by mouth daily.     dicyclomine  (BENTYL ) 10 MG capsule Take 10 mg by mouth in the morning and at bedtime. (Patient not taking: Reported on 10/27/2023)     No current facility-administered medications for this visit.    PHYSICAL EXAMINATION: ECOG PERFORMANCE STATUS: 0 - Asymptomatic  Vitals:   11/04/23 1139  BP: (!) 118/58  Pulse: 91  Resp: (!) 21  Temp: 98 F (36.7 C)  SpO2: 96%   Wt Readings from Last 3 Encounters:  11/04/23 177 lb 1.6 oz (80.3 kg)  10/27/23 182 lb (82.6 kg)  09/08/23 182 lb 6 oz (82.7 kg)     GENERAL:alert, no distress and comfortable SKIN: skin color, texture, turgor are normal, no rashes or significant lesions EYES: normal, Conjunctiva are pink and non-injected, sclera clear NECK: supple, thyroid  normal size, non-tender, without nodularity LYMPH:  no palpable lymphadenopathy in the cervical, axillary  LUNGS: clear to auscultation and percussion with normal breathing effort HEART: regular rate & rhythm and no murmurs and no lower extremity edema ABDOMEN:abdomen soft, non-tender and normal bowel sounds Musculoskeletal:no cyanosis of digits and no clubbing  NEURO: alert & oriented x 3 with fluent speech, no focal motor/sensory deficits BREAST: Left breast lumpectomy scar visible.  No palpable breast mass or axillary adenopathy. Physical Exam   LABORATORY DATA:  I have reviewed the data as listed    Latest Ref Rng & Units 11/04/2023   10:52 AM 04/22/2023    9:10 AM 10/20/2022   11:44 AM  CBC  WBC 4.0 - 10.5 K/uL 8.3  7.7  7.4   Hemoglobin 12.0 - 15.0 g/dL 60.4  54.0  98.1   Hematocrit 36.0 - 46.0 % 39.8  42.0  43.7   Platelets 150 - 400 K/uL 265  267  286         Latest Ref Rng & Units 11/04/2023   10:52 AM 04/22/2023    9:10 AM 10/20/2022   11:44 AM  CMP  Glucose 70 - 99 mg/dL 191  478  295   BUN 8 - 23 mg/dL 17  26  18    Creatinine 0.44 - 1.00 mg/dL 6.21  3.08  6.57   Sodium 135 -  145 mmol/L 138  139  141   Potassium 3.5 - 5.1 mmol/L 3.7  3.9  3.9   Chloride 98 - 111 mmol/L 106  107  106   CO2 22 - 32 mmol/L 22  25  23    Calcium  8.9 - 10.3 mg/dL 9.1  9.6  9.8   Total Protein 6.5 - 8.1 g/dL 6.7  7.0  7.2  Total Bilirubin 0.0 - 1.2 mg/dL 0.9  0.4  0.5   Alkaline Phos 38 - 126 U/L 77  82  81   AST 15 - 41 U/L 20  16  28    ALT 0 - 44 U/L 21  20  48       RADIOGRAPHIC STUDIES: I have personally reviewed the radiological images as listed and agreed with the findings in the report. No results found.    Orders Placed This Encounter  Procedures   MM Digital Screening    Standing Status:   Future    Expected Date:   02/06/2024    Expiration Date:   11/03/2024    Scheduling Instructions:     [facility]    Reason for Exam (SYMPTOM  OR DIAGNOSIS REQUIRED):   screening    Preferred imaging location?:   External   All questions were answered. The patient knows to call the clinic with any problems, questions or concerns. No barriers to learning was detected. The total time spent in the appointment was 25 minutes, including review of chart and various tests results, discussions about plan of care and coordination of care plan     Sonja [place], MD 11/04/2023

## 2023-11-13 ENCOUNTER — Ambulatory Visit: Payer: Self-pay

## 2023-11-13 NOTE — Telephone Encounter (Signed)
-----   Message from Conway Dennis sent at 11/13/2023  8:23 AM EDT ----- Lung cancer screening is negative.  Please recommend continued f/u.  She is recommended to undergo annual lung cancer screening. ----- Message ----- From: Interface, Rad Results In Sent: 11/12/2023   3:49 PM EDT To: Percival Brace, NP

## 2023-11-13 NOTE — Telephone Encounter (Signed)
 Pt was given results and agreeable to recommendation.

## 2024-01-12 DIAGNOSIS — F331 Major depressive disorder, recurrent, moderate: Secondary | ICD-10-CM | POA: Diagnosis not present

## 2024-02-03 ENCOUNTER — Telehealth: Payer: Self-pay | Admitting: Gastroenterology

## 2024-02-03 NOTE — Telephone Encounter (Signed)
 Inbound call from patient stating she has been having stomach pain and diarrhea for the past 3 days. Patient is requesting a call to discuss further. Please advise, thank you

## 2024-02-03 NOTE — Telephone Encounter (Signed)
 Called and spoke with patient. Patient reports that she has been having constant abdominal pain for the past 3 days. Patient reports that her last solid BM was Sunday and she does not feel like she evacuated completely. Patient reports that the abdominal pain feels like it is due to constipation, not crampy. Patient reports having 2 episodes of explosive diarrhea. Patient is passing gas, denies any bloating. Patient has not taking any Bentyl , Imodium, or stool softeners. Patient is not sure if she is constipated or truly having diarrhea. I told patient that I was not sure if you would want to order an x-ray to assess stool burden. Patient has been eating a regular diet, but she is mindful of what she eats due to her history of IBS. No new medications or diet changes. Patient takes 1 TSP of Benefiber in tea daily. Please advise.

## 2024-02-04 ENCOUNTER — Other Ambulatory Visit: Payer: Self-pay

## 2024-02-04 DIAGNOSIS — R109 Unspecified abdominal pain: Secondary | ICD-10-CM

## 2024-02-04 DIAGNOSIS — R197 Diarrhea, unspecified: Secondary | ICD-10-CM

## 2024-02-04 NOTE — Telephone Encounter (Signed)
 Discussed MD recommendations with patient & she stated symptoms have completely resolved. She would prefer to hold off on KUB for now. Advised she call back if symptoms return or with any questions/concerns. Pt verbalized all understanding.

## 2024-02-10 ENCOUNTER — Other Ambulatory Visit (HOSPITAL_COMMUNITY): Payer: Self-pay | Admitting: Psychiatry

## 2024-02-12 DIAGNOSIS — E1169 Type 2 diabetes mellitus with other specified complication: Secondary | ICD-10-CM | POA: Diagnosis not present

## 2024-02-12 DIAGNOSIS — R0789 Other chest pain: Secondary | ICD-10-CM | POA: Diagnosis not present

## 2024-02-12 DIAGNOSIS — Z23 Encounter for immunization: Secondary | ICD-10-CM | POA: Diagnosis not present

## 2024-02-15 ENCOUNTER — Other Ambulatory Visit: Payer: Self-pay

## 2024-02-15 ENCOUNTER — Telehealth: Payer: Self-pay

## 2024-02-15 NOTE — Telephone Encounter (Signed)
 Opened by mistake.

## 2024-02-19 DIAGNOSIS — R928 Other abnormal and inconclusive findings on diagnostic imaging of breast: Secondary | ICD-10-CM | POA: Diagnosis not present

## 2024-02-19 DIAGNOSIS — R92323 Mammographic fibroglandular density, bilateral breasts: Secondary | ICD-10-CM | POA: Diagnosis not present

## 2024-02-24 ENCOUNTER — Encounter: Payer: Self-pay | Admitting: Cardiology

## 2024-02-24 ENCOUNTER — Other Ambulatory Visit (HOSPITAL_COMMUNITY): Payer: Self-pay

## 2024-02-24 ENCOUNTER — Ambulatory Visit: Attending: Cardiology | Admitting: Cardiology

## 2024-02-24 VITALS — BP 124/68 | HR 81 | Ht 63.0 in | Wt 167.4 lb

## 2024-02-24 DIAGNOSIS — E782 Mixed hyperlipidemia: Secondary | ICD-10-CM | POA: Insufficient documentation

## 2024-02-24 DIAGNOSIS — F17210 Nicotine dependence, cigarettes, uncomplicated: Secondary | ICD-10-CM | POA: Diagnosis not present

## 2024-02-24 DIAGNOSIS — R072 Precordial pain: Secondary | ICD-10-CM | POA: Insufficient documentation

## 2024-02-24 DIAGNOSIS — R931 Abnormal findings on diagnostic imaging of heart and coronary circulation: Secondary | ICD-10-CM | POA: Diagnosis not present

## 2024-02-24 MED ORDER — NITROGLYCERIN 0.4 MG SL SUBL
0.4000 mg | SUBLINGUAL_TABLET | SUBLINGUAL | 3 refills | Status: AC | PRN
  Filled 2024-02-24: qty 25, 8d supply, fill #0

## 2024-02-24 MED ORDER — ASPIRIN 81 MG PO TBEC
81.0000 mg | DELAYED_RELEASE_TABLET | Freq: Every day | ORAL | 2 refills | Status: DC
  Filled 2024-02-24: qty 30, 30d supply, fill #0

## 2024-02-24 MED ORDER — METOPROLOL TARTRATE 100 MG PO TABS
100.0000 mg | ORAL_TABLET | Freq: Once | ORAL | 0 refills | Status: DC
  Filled 2024-02-24: qty 1, 1d supply, fill #0

## 2024-02-24 MED ORDER — METOPROLOL SUCCINATE ER 25 MG PO TB24
25.0000 mg | ORAL_TABLET | Freq: Every day | ORAL | 3 refills | Status: DC
  Filled 2024-02-24: qty 30, 30d supply, fill #0

## 2024-02-24 NOTE — Progress Notes (Signed)
 Cardiology Office Note:  .   Date:  02/24/2024  ID:  Tara Davis, DOB [birth year], MRN [id number] PCP: [name], DO  [facility] Providers Cardiologist:  Newman Lawrence, MD PCP: [name], DO  Chief Complaint  Patient presents with   Establish Care   Chest Pain     Tara Davis is a 78 y.o. female with hyperlipidemia, type 2 diabetes mellitus, nicotine dependence, chest pain  Discussed the use of AI scribe software for clinical note transcription with the patient, who gave verbal consent to proceed.  History of Present Illness She experienced two episodes of chest pain in one week, described as notable and distracting, with radiation to her jaw. Each episode lasted about ten minutes. The first episode occurred while she was at her desk working, and she managed the pain by lying down. A similar episode occurred a few days later, prompting her to seek medical attention.  Over ten years ago, she had a similar incident and underwent heart catheterization, which showed no significant findings. She was told it might have been an anxiety attack.  She denies chest pain during physical activity, shortness of breath, leg swelling, lightheadedness, or heart racing. Her diabetes is well-controlled with Rybelsus. She takes Lipitor for cholesterol and omeprazole .  She has a history of smoking, currently smoking about half a pack a day, and uses a nicotine patch when traveling.      Vitals:   02/24/24 1016  BP: 124/68  Pulse: 81  SpO2: 97%      Review of Systems  Cardiovascular:  Positive for chest pain (As per HPI). Negative for dyspnea on exertion, leg swelling, palpitations and syncope.        Studies Reviewed: SABRA        EKG 02/24/2024: Normal sinus rhythm Cannot rule out Anterior infarct , age undetermined When compared with ECG of 07-Jan-2008 21:37, Inverted T waves have replaced nonspecific T wave abnormality in Lateral leads   CT chest  10/2023: 1. Lung-RADS 2, benign appearance or behavior. Continue annual screening with low-dose chest CT without contrast in 12 months. 2. Aortic Atherosclerosis (ICD10-I70.0) and Emphysema (ICD10-J43.9). Coronary artery atherosclerosis.  CT cardiac scoring 10/2022: Left Main: 1.4  LAD: 14.9  LCx: 63.4  RCA: 0   Total Agatston Score: 79.7 MESA database percentile: 56   AORTA MEASUREMENTS: Ascending Aorta: 3.2 cm Descending Aorta:2.7 cm  Labs 3-10/2023: Chol 170, TG 124, HDL 51, LDL 94 Hb 14 Cr 0.75 HbA1C 5.6% TSH 3.0  2023: HbA1C 6.2%    Physical Exam Vitals and nursing note reviewed.  Constitutional:      General: She is not in acute distress. Neck:     Vascular: No JVD.  Cardiovascular:     Rate and Rhythm: Normal rate and regular rhythm.     Heart sounds: Normal heart sounds. No murmur heard. Pulmonary:     Effort: Pulmonary effort is normal.     Breath sounds: Normal breath sounds. No wheezing or rales.  Musculoskeletal:     Right lower leg: No edema.     Left lower leg: No edema.      VISIT DIAGNOSES:   ICD-10-CM   1. Precordial pain  R07.2 EKG 12-Lead    CT CORONARY MORPH W/CTA COR W/SCORE W/CA W/CM &/OR WO/CM    metoprolol  tartrate (LOPRESSOR ) 100 MG tablet    CBC    Basic metabolic panel with GFR    Lipid panel    ECHOCARDIOGRAM COMPLETE  2. Elevated coronary artery calcium  score  R93.1 EKG 12-Lead    3. Mixed hyperlipidemia  E78.2     4. Nicotine dependence, cigarettes, uncomplicated  F17.210        Tara Davis is a 78 y.o. female with  hyperlipidemia, type 2 diabetes mellitus, nicotine dependence, chest pain  Assessment & Plan Chest pain: Intermittent chest pain radiating to the jaw, occurring twice in one week, lasting about ten minutes each time. Coronary artery disease with moderate calcification. Risk factors include type 2 diabetes mellitus and smoking. Differential includes possible severe narrowing. - Order coronary CT  angiogram with contrast. - Order echocardiogram. - Prescribe aspirin  81 mg daily. - Prescribe metoprolol  succinate 25 mg daily. - Prescribe nitroglycerin  as needed for chest pain. - Instruct to use nitroglycerin  sublingually if chest pain occurs, and call 911 if pain persists after two doses.  Type 2 diabetes mellitus Well-controlled with Rybelsus.  Mixed hyperlipidemia: On Lipitor. - Check cholesterol levels. - Consider increasing statin dose based on cholesterol results.  Nicotine dependence, cigarettes: Tobacco cessation counseling: - Currently smoking 1/2 packs/day   - Patient was informed of the dangers of tobacco abuse including stroke, cancer, and MI, as well as benefits of tobacco cessation. - Patient is willing to quit at this time. - Approximately 5 mins were spent counseling patient cessation techniques. We discussed various methods to help quit smoking, including deciding on a date to quit, joining a support group, pharmacological agents. Patient would like to use nicotine patch. - I will reassess her progress at the next follow-up visit\     Meds ordered this encounter  Medications   metoprolol  tartrate (LOPRESSOR ) 100 MG tablet    Sig: Take 1 tablet (100 mg total) by mouth once for 1 dose. Take 90-120 minutes prior to scan. Hold for SBP less than 110.    Dispense:  1 tablet    Refill:  0   metoprolol  succinate (TOPROL  XL) 25 MG 24 hr tablet    Sig: Take 1 tablet (25 mg total) by mouth daily.    Dispense:  30 tablet    Refill:  3   aspirin  EC 81 MG tablet    Sig: Take 1 tablet (81 mg total) by mouth daily. Swallow whole.    Dispense:  30 tablet    Refill:  2   nitroGLYCERIN  (NITROSTAT ) 0.4 MG SL tablet    Sig: Place 1 tablet (0.4 mg total) under the tongue every 5 (five) minutes as needed.    Dispense:  25 tablet    Refill:  3     F/u after tests  Signed, Newman JINNY Lawrence, MD

## 2024-02-24 NOTE — Patient Instructions (Signed)
 Medication Instructions:  START use of Metoprolol  XL 25 mg daily START Aspirin  81 mg  START AS NEED FOR CHEST PAIN nitroglycerin     ON DAY OF CT SCAN: Metoprolol  100 mg Take 1 tablet (100 mg total) by mouth once for 1 dose. Take 90-120 minutes prior to scan. Hold for SBP less than 110.   *If you need a refill on your cardiac medications before your next appointment, please call your pharmacy*  Lab Work: Cbc Bmp Lipid panel  If you have labs (blood work) drawn today and your tests are completely normal, you will receive your results only by: MyChart Message (if you have MyChart) OR A paper copy in the mail If you have any lab test that is abnormal or we need to change your treatment, we will call you to review the results.  Testing/Procedures: Coronary cta   Your physician has requested that you have cardiac CT. Cardiac computed tomography (CT) is a painless test that uses an x-ray machine to take clear, detailed pictures of your heart. For further information please visit [email or web address] Please follow instruction sheet as given.     Your cardiac CT will be scheduled at one of the below locations:   [facility] [address]  [place], [place] [address] [phone]  If scheduled at the Heart and Vascular Tower at [facility] street, please enter the parking lot using the [address] entrance and use the FREE valet service at the patient drop-off area. Enter the building and check-in with registration on the main floor.  Please follow these instructions carefully (unless otherwise directed):  An IV will be required for this test and Nitroglycerin  will be given.  Hold all erectile dysfunction medications at least 3 days (72 hrs) prior to test. (Ie viagra, cialis, sildenafil, tadalafil, etc)   On the Night Before the Test: Be sure to Drink plenty of water. Do not consume any caffeinated/decaffeinated beverages or chocolate 12 hours prior to  your test. Do not take any antihistamines 12 hours prior to your test.  On the Day of the Test: Drink plenty of water until 1 hour prior to the test. Do not eat any food 1 hour prior to test. You may take your regular medications prior to the test.  Take metoprolol  (Lopressor ) two hours prior to test. HOLD Metoprolol  XL 25 MG day of.  If you take Furosemide/Hydrochlorothiazide/Spironolactone/Chlorthalidone, please HOLD on the morning of the test. Patients who wear a continuous glucose monitor MUST remove the device prior to scanning. FEMALES- please wear underwire-free bra if available, avoid dresses & tight clothing       After the Test: Drink plenty of water. After receiving IV contrast, you may experience a mild flushed feeling. This is normal. On occasion, you may experience a mild rash up to 24 hours after the test. This is not dangerous. If this occurs, you can take Benadryl 25 mg, Zyrtec, Claritin, or Allegra and increase your fluid intake. (Patients taking Tikosyn should avoid Benadryl, and may take Zyrtec, Claritin, or Allegra) If you experience trouble breathing, this can be serious. If it is severe call 911 IMMEDIATELY. If it is mild, please call our office.  We will call to schedule your test 2-4 weeks out understanding that some insurance companies will need an authorization prior to the service being performed.   For more information and frequently asked questions, please visit our website : [email or web address]  For non-scheduling related questions, please contact the cardiac imaging nurse  navigator should you have any questions/concerns: Cardiac Imaging Nurse Navigators Direct Office Dial: [phone]   For scheduling needs, including cancellations and rescheduling, please call [place], [phone].  Echocardiogram   Your physician has requested that you have an echocardiogram. Echocardiography is a painless test that uses sound waves to create images of  your heart. It provides your doctor with information about the size and shape of your heart and how well your heart's chambers and valves are working. This procedure takes approximately one hour. There are no restrictions for this procedure. Please do NOT wear cologne, perfume, aftershave, or lotions (deodorant is allowed). Please arrive 15 minutes prior to your appointment time.  Please note: We ask at that you not bring children with you during ultrasound (echo/ vascular) testing. Due to room size and safety concerns, children are not allowed in the ultrasound rooms during exams. Our front office staff cannot provide observation of children in our lobby area while testing is being conducted. An adult accompanying a patient to their appointment will only be allowed in the ultrasound room at the discretion of the ultrasound technician under special circumstances. We apologize for any inconvenience.   Follow-Up: At [facility], you and your health needs are our priority.  As part of our continuing mission to provide you with exceptional heart care, our providers are all part of one team.  This team includes your primary Cardiologist (physician) and Advanced Practice Providers or APPs (Physician Assistants and Nurse Practitioners) who all work together to provide you with the care you need, when you need it.  Your next appointment:   Follow up after tests   Provider:   Newman JINNY Lawrence, MD    We recommend signing up for the patient portal called MyChart.  Sign up information is provided on this After Visit Summary.  MyChart is used to connect with patients for Virtual Visits (Telemedicine).  Patients are able to view lab/test results, encounter notes, upcoming appointments, etc.  Non-urgent messages can be sent to your provider as well.   To learn more about what you can do with MyChart, go to [email or web address].

## 2024-02-25 ENCOUNTER — Encounter: Payer: Self-pay | Admitting: Adult Health

## 2024-02-25 LAB — BASIC METABOLIC PANEL WITH GFR
BUN/Creatinine Ratio: 20 (ref 12–28)
BUN: 17 mg/dL (ref 8–27)
CO2: 21 mmol/L (ref 20–29)
Calcium: 9.8 mg/dL (ref 8.7–10.3)
Chloride: 101 mmol/L (ref 96–106)
Creatinine, Ser: 0.85 mg/dL (ref 0.57–1.00)
Glucose: 91 mg/dL (ref 70–99)
Potassium: 4.8 mmol/L (ref 3.5–5.2)
Sodium: 138 mmol/L (ref 134–144)
eGFR: 71 mL/min/1.73 (ref >59)

## 2024-02-25 LAB — CBC
Hematocrit: 44.9 % (ref 34.0–46.6)
Hemoglobin: 15.1 g/dL (ref 11.1–15.9)
MCH: 33.5 pg — ABNORMAL HIGH (ref 26.6–33.0)
MCHC: 33.6 g/dL (ref 31.5–35.7)
MCV: 100 fL — ABNORMAL HIGH (ref 79–97)
Platelets: 288 x10E3/uL (ref 150–450)
RBC: 4.51 x10E6/uL (ref 3.77–5.28)
RDW: 12.5 % (ref 11.7–15.4)
WBC: 9.7 x10E3/uL (ref 3.4–10.8)

## 2024-02-25 LAB — LIPID PANEL
Chol/HDL Ratio: 2.5 ratio (ref 0.0–4.4)
Cholesterol, Total: 166 mg/dL (ref 100–199)
HDL: 66 mg/dL (ref >39)
LDL Chol Calc (NIH): 85 mg/dL (ref 0–99)
Triglycerides: 78 mg/dL (ref 0–149)
VLDL Cholesterol Cal: 15 mg/dL (ref 5–40)

## 2024-03-01 ENCOUNTER — Encounter (HOSPITAL_COMMUNITY): Payer: Self-pay

## 2024-03-02 DIAGNOSIS — K080 Exfoliation of teeth due to systemic causes: Secondary | ICD-10-CM | POA: Diagnosis not present

## 2024-03-03 ENCOUNTER — Ambulatory Visit (HOSPITAL_COMMUNITY)
Admission: RE | Admit: 2024-03-03 | Discharge: 2024-03-03 | Disposition: A | Discharge status: Home / Self Care | Source: Ambulatory Visit | Attending: Cardiology | Admitting: Cardiology

## 2024-03-03 DIAGNOSIS — R072 Precordial pain: Secondary | ICD-10-CM | POA: Diagnosis not present

## 2024-03-03 DIAGNOSIS — I2584 Coronary atherosclerosis due to calcified coronary lesion: Secondary | ICD-10-CM | POA: Diagnosis not present

## 2024-03-03 DIAGNOSIS — I2510 Atherosclerotic heart disease of native coronary artery without angina pectoris: Secondary | ICD-10-CM | POA: Insufficient documentation

## 2024-03-03 MED ORDER — IOHEXOL 350 MG/ML SOLN
100.0000 mL | Freq: Once | INTRAVENOUS | Status: AC | PRN
  Administered 2024-03-03: 100 mL via INTRAVENOUS

## 2024-03-03 MED ORDER — NITROGLYCERIN 0.4 MG SL SUBL
0.8000 mg | SUBLINGUAL_TABLET | Freq: Once | SUBLINGUAL | Status: AC
  Administered 2024-03-03: 0.8 mg via SUBLINGUAL

## 2024-03-04 ENCOUNTER — Ambulatory Visit: Payer: Self-pay | Admitting: Cardiology

## 2024-03-08 ENCOUNTER — Ambulatory Visit (HOSPITAL_COMMUNITY)
Admission: RE | Admit: 2024-03-08 | Discharge: 2024-03-08 | Disposition: A | Discharge status: Home / Self Care | Source: Ambulatory Visit | Attending: Cardiology | Admitting: Cardiology

## 2024-03-08 DIAGNOSIS — R072 Precordial pain: Secondary | ICD-10-CM | POA: Diagnosis not present

## 2024-03-08 LAB — ECHOCARDIOGRAM COMPLETE
Area-P 1/2: 2.27 cm2
S' Lateral: 2.6 cm

## 2024-03-14 ENCOUNTER — Telehealth: Payer: Self-pay | Admitting: Cardiology

## 2024-03-14 NOTE — Telephone Encounter (Signed)
 Pt contacted and advise. Pt agrees with plan of care.

## 2024-03-14 NOTE — Telephone Encounter (Signed)
Patient is requesting a call back to review echo results.

## 2024-03-14 NOTE — Progress Notes (Signed)
 Nonobstructive coronary artery disease.  Okay to stop aspirin , but recommend increasing Lipitor to 40 mg daily.  Repeat lipid panel in 3 months.  Thanks MJP

## 2024-03-14 NOTE — Telephone Encounter (Signed)
 Spoke with pt, she is wondering if she needs to continue aspirin  and metoprolol . Aware according to the notes on her CCTA, she can stop the aspirin . Aware will reach out to dr patwardhan to see if she needs to continue the metoprolol .

## 2024-03-14 NOTE — Telephone Encounter (Signed)
 Nonobstructive CAD, okay to stop Aspirin . Can continue metoprolol .  Thanks MJP

## 2024-03-15 NOTE — Progress Notes (Signed)
 Agree with continuing metoprolol  for medical management for CAD. Agree with stopping aspirin  given no obstructive CAD. If you are currently taking Lipitor 40 mg daily and LDL is 85, I would recommend increasing it to 80 mg daily.  Sorry for the confusion.  We can let Dr. Drue nurse know that we are increasing the dose.  I am sure they would be in agreement, but happy to discuss if there are any concerns with increasing the dose.  Thanks MJP

## 2024-03-16 MED ORDER — ATORVASTATIN CALCIUM 80 MG PO TABS: 80.0000 mg | ORAL_TABLET | Freq: Every day | ORAL | 3 refills | Status: AC

## 2024-03-17 ENCOUNTER — Other Ambulatory Visit (HOSPITAL_COMMUNITY): Payer: Self-pay

## 2024-03-18 ENCOUNTER — Telehealth: Payer: Self-pay | Admitting: Gastroenterology

## 2024-03-18 NOTE — Telephone Encounter (Signed)
 Returned call to patient & she has had diarrhea and abd cramping for 1 week. No relief with imodium. She previously called in for similar symptoms back in August & was recommended to have an abd XR to evaluate for stool burden, however this was not done at the time because her symptoms resolved. She will come in on Monday to do this & will let us  know if she needs any further refills on dicyclomine .

## 2024-03-18 NOTE — Telephone Encounter (Signed)
 Inbound call from patient stating she has been taking imodium and it seems to not be working still has diarrhea and now she's experiencing some cramping at night. Patient would like to be advised on what to do and if theres something that can be called in for her to take  Requesting a call back  Please advise  Thank you

## 2024-03-21 ENCOUNTER — Ambulatory Visit
Admission: RE | Admit: 2024-03-21 | Discharge: 2024-03-21 | Disposition: A | Source: Ambulatory Visit | Attending: Gastroenterology

## 2024-03-21 DIAGNOSIS — R197 Diarrhea, unspecified: Secondary | ICD-10-CM | POA: Diagnosis not present

## 2024-03-21 DIAGNOSIS — R109 Unspecified abdominal pain: Secondary | ICD-10-CM

## 2024-03-24 ENCOUNTER — Telehealth: Payer: Self-pay | Admitting: Pediatrics

## 2024-03-24 NOTE — Telephone Encounter (Signed)
 I was paged this evening [facility] radiology regarding a critical radiology result.  Radiology reports that the patient's plain film shows mildly dilated air-filled loops of small bowel in the left upper quadrant.  The page noted that the radiologist is advising the patient to go to the ER immediately.  I attempted to call Tara Davis to advise her of the result and assess if there are symptoms that warranted urgent ER evaluation overnight.  She did not answer her phone and I left a voicemail advising her to contact our office back through the answering service.  Chart review indicates that the plain film was performed for an indication of cramping and diarrhea x 1 week but no urgent or emergent symptoms.  If patient does not respond overnight will forward message to the office in the morning for follow-up.

## 2024-03-24 NOTE — Telephone Encounter (Signed)
 Tara Davis returned page from call overnight at 730 this morning.  Reports that she is feeling better.  States that dicyclomine  has alleviated abdominal cramping and her diarrhea is improving.  She denies symptoms of bowel obstruction-no severe abdominal pain, abdominal distention, nausea or vomiting at present.  States that earlier in the week she had nausea and vomiting but the symptoms have resolved and were not present at the time of her x-ray.  We discussed that it is possible her x-ray showed evidence of ileus rather than small bowel obstruction.  Given that she is clinically feeling better, I have advised that she continue to taking dicyclomine  for her abdominal pain and cramping and to monitor her symptoms.  If there is any significant change she will contact our office back.

## 2024-03-25 ENCOUNTER — Telehealth: Payer: Self-pay | Admitting: Gastroenterology

## 2024-03-25 NOTE — Telephone Encounter (Signed)
 Error on previous note. Dr. Suzann has reviewed this xray and spoke with pt this am. I will call the pt back to answer further questions.   Attempted to call pt back. Someone answered but did not say anything and then phone disconnected.

## 2024-03-25 NOTE — Telephone Encounter (Signed)
 2 view abd xray shows:   IMPRESSION: 1. Mildly dilated air-filled loops of small bowel in the left upper quadrant, which may reflect ileus or early/partial small-bowel obstruction. 2. Average stool burden.  Has not been reviewed by provider at this time. Results routed to provider to review and advise recommendations.

## 2024-03-25 NOTE — Telephone Encounter (Signed)
 Was able to call and speak with pt. She states that she reviewed her information on my chart and she searched on Google and she has more questions about her xray. Pt reported feeling anxious about the results and wanted to discuss them further. Again discussed with pt regarding her results as well as reviewed Dr. Andy recommendations from their earlier conversation. Pt reports she is having bms. Reports x2 today. The first one was normal, but the second one was explosive. Pt states she is able to tolerate food and drinks and reports that her abdominal cramping has decreased. Per Dr. Andy recommendations, encouraged pt to continue taking Dicyclomine  and to continue to monitor her symptoms. Encouraged a soft food diet such as mashed potatoes and soup. Discussed ER precautions with pt and she verbalized understanding. Pt reported feeling better after we talked and verbalized understanding of all instructions.

## 2024-03-25 NOTE — Telephone Encounter (Signed)
 Inbound call from patient stating she would like to speak to nurse in regards to radiologist results from 03/21/24. Patient states she was told x-ray showed small bowel obstruction. Patient has some questions she would like to ask and would like to be advised on what to do. Please advise  Thank you

## 2024-03-27 ENCOUNTER — Other Ambulatory Visit: Payer: Self-pay | Admitting: Hematology

## 2024-03-29 ENCOUNTER — Emergency Department (HOSPITAL_COMMUNITY)
Admission: EM | Admit: 2024-03-29 | Discharge: 2024-03-29 | Disposition: A | Discharge status: Home / Self Care | Source: Ambulatory Visit | Attending: Emergency Medicine | Admitting: Emergency Medicine

## 2024-03-29 ENCOUNTER — Other Ambulatory Visit: Payer: Self-pay

## 2024-03-29 ENCOUNTER — Encounter (HOSPITAL_COMMUNITY): Payer: Self-pay

## 2024-03-29 ENCOUNTER — Emergency Department (HOSPITAL_COMMUNITY)

## 2024-03-29 DIAGNOSIS — K529 Noninfective gastroenteritis and colitis, unspecified: Secondary | ICD-10-CM | POA: Diagnosis not present

## 2024-03-29 DIAGNOSIS — K5730 Diverticulosis of large intestine without perforation or abscess without bleeding: Secondary | ICD-10-CM | POA: Diagnosis not present

## 2024-03-29 DIAGNOSIS — R1085 Abdominal pain of multiple sites: Secondary | ICD-10-CM | POA: Diagnosis not present

## 2024-03-29 DIAGNOSIS — R932 Abnormal findings on diagnostic imaging of liver and biliary tract: Secondary | ICD-10-CM | POA: Diagnosis not present

## 2024-03-29 DIAGNOSIS — R109 Unspecified abdominal pain: Secondary | ICD-10-CM | POA: Diagnosis not present

## 2024-03-29 LAB — CBC
HCT: 47.1 % — ABNORMAL HIGH (ref 36.0–46.0)
Hemoglobin: 15.3 g/dL — ABNORMAL HIGH (ref 12.0–15.0)
MCH: 31.8 pg (ref 26.0–34.0)
MCHC: 32.5 g/dL (ref 30.0–36.0)
MCV: 97.9 fL (ref 80.0–100.0)
Platelets: 288 K/uL (ref 150–400)
RBC: 4.81 MIL/uL (ref 3.87–5.11)
RDW: 13.4 % (ref 11.5–15.5)
WBC: 8.8 K/uL (ref 4.0–10.5)
nRBC: 0 % (ref 0.0–0.2)

## 2024-03-29 LAB — COMPREHENSIVE METABOLIC PANEL WITH GFR
ALT: 23 U/L (ref 0–44)
AST: 23 U/L (ref 15–41)
Albumin: 4.6 g/dL (ref 3.5–5.0)
Alkaline Phosphatase: 91 U/L (ref 38–126)
Anion gap: 14 (ref 5–15)
BUN: 15 mg/dL (ref 8–23)
CO2: 22 mmol/L (ref 22–32)
Calcium: 9.8 mg/dL (ref 8.9–10.3)
Chloride: 104 mmol/L (ref 98–111)
Creatinine, Ser: 0.81 mg/dL (ref 0.44–1.00)
GFR, Estimated: >60 mL/min (ref >60)
Glucose, Bld: 112 mg/dL — ABNORMAL HIGH (ref 70–99)
Potassium: 3.9 mmol/L (ref 3.5–5.1)
Sodium: 140 mmol/L (ref 135–145)
Total Bilirubin: 0.7 mg/dL (ref 0.0–1.2)
Total Protein: 7.6 g/dL (ref 6.5–8.1)

## 2024-03-29 LAB — URINALYSIS, ROUTINE W REFLEX MICROSCOPIC
Bilirubin Urine: NEGATIVE
Glucose, UA: NEGATIVE mg/dL
Hgb urine dipstick: NEGATIVE
Ketones, ur: NEGATIVE mg/dL
Nitrite: NEGATIVE
Protein, ur: NEGATIVE mg/dL
Specific Gravity, Urine: 1.015 (ref 1.005–1.030)
pH: 5 (ref 5.0–8.0)

## 2024-03-29 LAB — LIPASE, BLOOD: Lipase: 24 U/L (ref 11–51)

## 2024-03-29 MED ORDER — IOHEXOL 300 MG/ML  SOLN
100.0000 mL | Freq: Once | INTRAMUSCULAR | Status: AC | PRN
  Administered 2024-03-29: 100 mL via INTRAVENOUS

## 2024-03-29 NOTE — ED Provider Triage Note (Signed)
 Emergency Medicine Provider Triage Evaluation Note  Tara Davis , a 78 y.o. female  was evaluated in triage.  Pt complains of lower abdominal pain, nausea and vomiting starting  7 days ago intermittently, worse today, last vomiting episode today. Nonbilous, nonbloody.    Drank tea and has tolerated without vomiting.   Endorses multiple episodes of diarrhea today. Endorses 1-2 vodka drinks daily in the evening. Had 2 drinks last night.   Denies fever, vision changes, chest pain, shortness of breath, dysuria, melena, hematochezia, vaginal discharge, vaginal bleeding, LE swelling  Review of Systems  Positive: N/a Negative: N/a  Physical Exam  BP (!) 152/95   Pulse 91   Temp 98.3 F (36.8 C) (Oral)   Resp 19   Ht 5' 3 (1.6 m)   Wt 72.6 kg   SpO2 97%   BMI 28.34 kg/m  Gen:   Awake, no distress   Resp:  Normal effort  MSK:   Moves extremities without difficulty  Other:    Medical Decision Making  Medically screening exam initiated at 2:23 PM.  Appropriate orders placed.  Leeroy ONEIDA Mayer was informed that the remainder of the evaluation will be completed by another provider, this initial triage assessment does not replace that evaluation, and the importance of remaining in the ED until their evaluation is complete.     Beola Terrall RAMAN, [place] 03/29/24 1427

## 2024-03-29 NOTE — ED Triage Notes (Signed)
 Patient said she had an xray that showed a small bowel obstruction. Vomited this morning and had diarrhea.

## 2024-03-29 NOTE — ED Provider Notes (Signed)
 [place] EMERGENCY DEPARTMENT AT [facility] Provider Note   CSN: [id number] Arrival date & time: 03/29/24  1408     Patient presents with: Abdominal Pain   Tara Davis is a 78 y.o. female.   HPI 78 year old female with a history of IBS presents with concern for possible bowel obstruction.  For about a week she has been having abdominal pain and some vomiting and diarrhea.  Symptoms are primarily in the morning.  Otherwise seem to come and go.  The abdominal pain is primarily lower.  She has not had any fevers or urinary symptoms.  No blood in the stool.  She had an x-ray last week that showed possible bowel obstruction and was told to go to the ER but her GI doctor told her that since her symptoms are better she could hold off.  However her symptoms have come back so she now presents to the ER today.  Prior to Admission medications   Medication Sig Start Date End Date Taking? Authorizing Provider  anastrozole  (ARIMIDEX ) 1 MG tablet TAKE 1 TABLET BY MOUTH DAILY 03/28/24   Lanny Callander, MD  atorvastatin  (LIPITOR) 80 MG tablet Take 1 tablet (80 mg total) by mouth daily. 03/16/24   Patwardhan, Manish J, MD  Bacillus Coagulans-Inulin (BENEFIBER PREBIOTIC+PROBIOTIC PO) Take 1 Scoop by mouth daily. 09/30/21   [provider]  loperamide (IMODIUM A-D) 2 MG tablet Take 2 mg by mouth as needed for diarrhea or loose stools. 09/30/21   [provider]  LORazepam  (ATIVAN ) 0.5 MG tablet Take 1 mg by mouth 2 (two) times daily. 04/28/12   [provider]  metoprolol  succinate (TOPROL  XL) 25 MG 24 hr tablet Take 1 tablet (25 mg total) by mouth daily. 02/24/24   Patwardhan, Newman PARAS, MD  metoprolol  tartrate (LOPRESSOR ) 100 MG tablet Take 1 tablet (100 mg total) by mouth once for 1 dose. Take 90-120 minutes prior to scan. Hold for SBP less than 110. 02/24/24 02/25/24  Patwardhan, Manish J, MD  nicotine (NICODERM CQ - DOSED IN MG/24 HOURS) 21 mg/24hr patch Place 21 mg onto  the skin daily as needed (nicotine). 09/30/21   [provider]  nitroGLYCERIN  (NITROSTAT ) 0.4 MG SL tablet Place 1 tablet (0.4 mg total) under the tongue every 5 (five) minutes as needed. 02/24/24   Patwardhan, Newman PARAS, MD  omeprazole  (PRILOSEC) 40 MG capsule Take 40 mg by mouth daily. 03/02/20   [provider]  ondansetron  (ZOFRAN ) 4 MG tablet Take 1 tablet (4 mg total) by mouth every 8 (eight) hours as needed for nausea. 03/12/22   Byerly, Faera, MD  Semaglutide (RYBELSUS) 3 MG TABS 3 mg. Patient taking differently: 7 mg daily. 09/10/22   [provider]  vortioxetine  HBr (TRINTELLIX ) 20 MG TABS tablet Take 20 mg by mouth daily. 03/02/20   [provider]    Allergies: Trazodone hcl, Amoxicillin -pot clavulanate, Codeine, and Penicillins    Review of Systems  Gastrointestinal:  Positive for abdominal pain, diarrhea and vomiting. Negative for blood in stool.  Genitourinary:  Negative for dysuria.    Updated Vital Signs BP (!) 165/78 (BP Location: Left Arm)   Pulse 71   Temp (!) 97.1 F (36.2 C) (Oral)   Resp 16   Ht 5' 3 (1.6 m)   Wt 72.6 kg   SpO2 98%   BMI 28.34 kg/m   Physical Exam Vitals and nursing note reviewed.  Constitutional:      Appearance: She is well-developed.  HENT:  Head: Normocephalic and atraumatic.  Cardiovascular:     Rate and Rhythm: Normal rate and regular rhythm.     Heart sounds: Normal heart sounds.  Pulmonary:     Effort: Pulmonary effort is normal.  Abdominal:     Palpations: Abdomen is soft.     Tenderness: There is abdominal tenderness (mild) in the right lower quadrant, suprapubic area and left lower quadrant.  Skin:    General: Skin is warm and dry.  Neurological:     Mental Status: She is alert.     (all labs ordered are listed, but only abnormal results are displayed) Labs Reviewed  COMPREHENSIVE METABOLIC PANEL WITH GFR - Abnormal; Notable for the following components:      Result Value    Glucose, Bld 112 (*)    All other components within normal limits  CBC - Abnormal; Notable for the following components:   Hemoglobin 15.3 (*)    HCT 47.1 (*)    All other components within normal limits  URINALYSIS, ROUTINE W REFLEX MICROSCOPIC - Abnormal; Notable for the following components:   APPearance HAZY (*)    Leukocytes,Ua TRACE (*)    Bacteria, UA FEW (*)    All other components within normal limits  LIPASE, BLOOD    EKG: None  Radiology: CT ABDOMEN PELVIS W CONTRAST Result Date: 03/29/2024 CLINICAL DATA:  Bilateral lower quadrant abdominal pain. Vomited this morning and had diarrhea. Clinical concern for small bowel obstruction. Mildly dilated air-filled loops of small bowel in the left upper quadrant of the abdomen were seen on radiographs dated 03/21/2024, suspicious for ileus or early/partial small bowel obstruction. EXAM: CT ABDOMEN AND PELVIS WITH CONTRAST TECHNIQUE: Multidetector CT imaging of the abdomen and pelvis was performed using the standard protocol following bolus administration of intravenous contrast. RADIATION DOSE REDUCTION: This exam was performed according to the departmental dose-optimization program which includes automated exposure control, adjustment of the mA and/or kV according to patient size and/or use of iterative reconstruction technique. CONTRAST:  OMNIPAQUE  IOHEXOL  300 MG/ML  SOLN COMPARISON:  05/23/2020 and abdomen and pelvis radiographs dated 03/21/2024 FINDINGS: Lower chest: Minimal bilateral peripheral atelectasis. Normal-sized heart. Hepatobiliary: Mild diffuse low density of the liver. Cholecystectomy clips. 5 mm probable cyst in the lateral segment of the left lobe of the liver, unchanged. Pancreas: Unremarkable. No pancreatic ductal dilatation or surrounding inflammatory changes. Spleen: Normal in size without focal abnormality. Adrenals/Urinary Tract: Adrenal glands are unremarkable. Kidneys are normal, without renal calculi, focal  lesion, or hydronephrosis. Bladder is unremarkable. Stomach/Bowel: The recently demonstrated dilated loops of jejunum in the left upper abdomen now normal in caliber with a maximum diameter of 2.6 cm. These loops are fluid-filled mild mucosal enhancement. There are also normal caliber fluid-filled small bowel loops in the pelvis with mild diffuse mucosal enhancement. Multiple sigmoid colon diverticula without evidence of diverticulitis. Surgically absent appendix. Unremarkable stomach. Vascular/Lymphatic: Atheromatous arterial calcifications without aneurysm. No enlarged lymph nodes. Reproductive: Uterus and bilateral adnexa are unremarkable. Other: No abdominal wall hernia or abnormality. No abdominopelvic ascites. Musculoskeletal: Lumbar and lower thoracic spine degenerative changes. These include marked facet degenerative changes in the mid and lower lumbar spine with associated grade 1 anterolisthesis at the L4-5 level. IMPRESSION: 1. Findings compatible with small bowel enteritis without evidence of small bowel obstruction. 2. Sigmoid diverticulosis. 3. Mild diffuse hepatic steatosis. Electronically Signed   By: Elspeth Bathe M.D.   On: 03/29/2024 17:18     Procedures   Medications Ordered in the ED  iohexol  (OMNIPAQUE ) 300 MG/ML solution 100 mL (100 mLs Intravenous Contrast Given 03/29/24 1640)                                    Medical Decision Making Amount and/or Complexity of Data Reviewed External Data Reviewed: notes. Labs: ordered.    Details: Normal WBC Radiology: independent interpretation performed.    Details: No bowel obstruction   Patient presents with concern for bowel obstruction based on x-ray last week.  She does have some mild lower abdominal tenderness.  CT has been reviewed and I agree with radiology, no bowel obstruction.  She has only minimal tenderness on exam.  Symptomatically is having some diarrhea, discussed using Imodium over-the-counter and follow-up with her  gastroenterologist.  Otherwise, labs are reassuring.  No UTI symptoms.  Will discharge home with return precautions.     Final diagnoses:  Enteritis    ED Discharge Orders     None          Freddi Hamilton, MD 03/29/24 2156

## 2024-03-29 NOTE — Discharge Instructions (Addendum)
 Your CT scan shows that you have enteritis.  This seems to be causing your abdominal discomfort and diarrhea.  Call your GI doctor in the morning for further instructions.  If you develop new or worsening abdominal pain, vomiting, bloody stools, or any other new/concerning symptoms then return to the ER.

## 2024-03-30 ENCOUNTER — Encounter: Payer: Self-pay | Admitting: Gastroenterology

## 2024-03-30 DIAGNOSIS — K529 Noninfective gastroenteritis and colitis, unspecified: Secondary | ICD-10-CM

## 2024-03-30 DIAGNOSIS — R197 Diarrhea, unspecified: Secondary | ICD-10-CM

## 2024-03-30 DIAGNOSIS — R109 Unspecified abdominal pain: Secondary | ICD-10-CM

## 2024-03-30 NOTE — Telephone Encounter (Signed)
 Lab & stool study orders in epic. Patient notified of recommendations via MyChart.

## 2024-04-04 ENCOUNTER — Ambulatory Visit: Payer: Self-pay | Admitting: Gastroenterology

## 2024-04-04 ENCOUNTER — Other Ambulatory Visit (INDEPENDENT_AMBULATORY_CARE_PROVIDER_SITE_OTHER)

## 2024-04-04 DIAGNOSIS — K529 Noninfective gastroenteritis and colitis, unspecified: Secondary | ICD-10-CM

## 2024-04-04 DIAGNOSIS — R197 Diarrhea, unspecified: Secondary | ICD-10-CM

## 2024-04-04 DIAGNOSIS — R109 Unspecified abdominal pain: Secondary | ICD-10-CM

## 2024-04-04 LAB — C-REACTIVE PROTEIN: CRP: 0.6 mg/dL (ref 0.5–20.0)

## 2024-04-04 LAB — SEDIMENTATION RATE: Sed Rate: 20 mm/h (ref 0–30)

## 2024-04-05 ENCOUNTER — Other Ambulatory Visit

## 2024-04-05 DIAGNOSIS — R109 Unspecified abdominal pain: Secondary | ICD-10-CM

## 2024-04-05 DIAGNOSIS — K529 Noninfective gastroenteritis and colitis, unspecified: Secondary | ICD-10-CM | POA: Diagnosis not present

## 2024-04-05 DIAGNOSIS — R197 Diarrhea, unspecified: Secondary | ICD-10-CM

## 2024-04-06 LAB — CLOSTRIDIUM DIFFICILE BY PCR: Toxigenic C. Difficile by PCR: POSITIVE — AB

## 2024-04-07 ENCOUNTER — Encounter: Payer: Self-pay | Admitting: Gastroenterology

## 2024-04-07 ENCOUNTER — Ambulatory Visit: Admitting: Gastroenterology

## 2024-04-07 VITALS — BP 112/50 | HR 74 | Ht 63.0 in | Wt 165.4 lb

## 2024-04-07 DIAGNOSIS — K219 Gastro-esophageal reflux disease without esophagitis: Secondary | ICD-10-CM | POA: Diagnosis not present

## 2024-04-07 DIAGNOSIS — R197 Diarrhea, unspecified: Secondary | ICD-10-CM | POA: Diagnosis not present

## 2024-04-07 DIAGNOSIS — A498 Other bacterial infections of unspecified site: Secondary | ICD-10-CM | POA: Diagnosis not present

## 2024-04-07 MED ORDER — VANCOMYCIN HCL 125 MG PO CAPS: 125.0000 mg | ORAL_CAPSULE | Freq: Four times a day (QID) | ORAL | 0 refills | Status: AC

## 2024-04-07 NOTE — Progress Notes (Signed)
 Chief Complaint:    Diarrhea  GI History: Tara Davis is a 78 y.o. female with history of breast cancer diagnosed 02/2022  s/p lumpectomy and now on anastrozole , diabetes, depression, tobacco use, cholecystectomy, follows in GI clinic for the following:  1) IBS-D.  Longstanding history of chronic nonbloody diarrhea.  Symptoms present for many years. Generally well controlled with fiber supplement daily and imodium prn (takes about 3 times/week). This has been evaluated by Dr. Teressa, Dr. Burnette at [facility], and by Dr. Sherell at [facility] in the past.  2) GERD.  Longstanding history of reflux.  Well-controlled on Prilosec 40 mg daily.   Endoscopic History: - 04/2010: EGD: H. pylori gastritis - 11/2016: EGD: Normal   - 10/2009: Colonoscopy: Small adenomas removed, diverticulosis, hemorrhoids - 04/2010: Colonoscopy: 3 small adenomas - 11/2016: Colonoscopy: four small polyps removed from the transverse and sigmoid colon, histology serrated and hyperplastic polyps; random biopsies were normal; left-sided diverticulosis - 10/27/2023: Colonoscopy: 3 subcentimeter polyps (adenoma x 1), left-sided diverticulosis, grade 2 internal hemorrhoids.  Repeat 7 years  HPI:     Patient is a 78 y.o. female presenting to the Gastroenterology Clinic for ER follow-up.  Had been having abdominal pain and diarrhea for several weeks.  Similar symptoms back in August as well.  Abdominal x-ray in 03/21/2024 with mildly dilated air-fluid loops of small bowel.  Was seen in the ER on 03/29/2024 for continued abdominal pain and diarrhea.  Symptoms come and go.  Was otherwise hemodynamically stable. - Normal CBC without leukocytosis - Normal CMP, lipase, UA - CT A/P: Small bowel enteritis without obstruction, mild hepatic steatosis  Labs reviewed from 04/04/2024: - Normal ESR, CRP - C. difficile toxin positive by PCR - Stool culture negative  Was still having nausea/vomiting and diarrhea yesterday. Sxs better  today after imodium x2 yesterday and Bentyl .   Has been able to keep up with fluids.   No recent Abx.   Review of systems:     No chest pain, no SOB, no fevers, no urinary sx   Past Medical History:  Diagnosis Date   Adenomatous colon polyp    Anxiety    Arthritis    Bilateral cataracts    within 6 mos.   C. difficile diarrhea    Depression    Diverticulosis    Fatty liver    GERD (gastroesophageal reflux disease)    H. pylori infection    Hyperlipidemia    IBS (irritable bowel syndrome)    Ulcer 1988   Uterine fibroid     Patient's surgical history, family medical history, social history, medications and allergies were all reviewed in Epic    Current Outpatient Medications  Medication Sig Dispense Refill   anastrozole  (ARIMIDEX ) 1 MG tablet TAKE 1 TABLET BY MOUTH DAILY 30 tablet 5   atorvastatin  (LIPITOR) 80 MG tablet Take 1 tablet (80 mg total) by mouth daily. 90 tablet 3   Bacillus Coagulans-Inulin (BENEFIBER PREBIOTIC+PROBIOTIC PO) Take 1 Scoop by mouth daily.     Isopropyl Alcohol (ALCOHOL WIPES) 70 % MISC To check blood sugars three times a day Externally three times a day; Duration: 30 days     Lancets (ONETOUCH DELICA PLUS LANCET33G) MISC USE 3 TIMES DAILY TO CHECK BLOOD SUGARS AS DIRECTED; Duration: 33     loperamide (IMODIUM A-D) 2 MG tablet Take 2 mg by mouth as needed for diarrhea or loose stools.     LORazepam  (ATIVAN ) 0.5 MG tablet Take 1 mg by mouth  2 (two) times daily.     metoprolol  succinate (TOPROL  XL) 25 MG 24 hr tablet Take 1 tablet (25 mg total) by mouth daily. 30 tablet 3   nicotine (NICODERM CQ - DOSED IN MG/24 HOURS) 21 mg/24hr patch Place 21 mg onto the skin daily as needed (nicotine).     nitroGLYCERIN  (NITROSTAT ) 0.4 MG SL tablet Place 1 tablet (0.4 mg total) under the tongue every 5 (five) minutes as needed. 25 tablet 3   omeprazole  (PRILOSEC) 40 MG capsule Take 40 mg by mouth daily.     ONETOUCH VERIO test strip To use daily to three times  daily to check blood sugars for 30 days     Semaglutide (RYBELSUS) 3 MG TABS 3 mg.     vortioxetine  HBr (TRINTELLIX ) 20 MG TABS tablet Take 20 mg by mouth daily.     ondansetron  (ZOFRAN ) 4 MG tablet Take 1 tablet (4 mg total) by mouth every 8 (eight) hours as needed for nausea. (Patient not taking: Reported on 04/07/2024) 8 tablet 5   No current facility-administered medications for this visit.    Physical Exam:     BP (!) 112/50   Pulse 74   Ht 5' 3 (1.6 m)   Wt 165 lb 6 oz (75 kg)   BMI 29.29 kg/m   GENERAL:  Pleasant female in NAD PSYCH: : Cooperative, normal affect Musculoskeletal:  Normal muscle tone, normal strength NEURO: Alert and oriented x 3, no focal neurologic deficits   IMPRESSION and PLAN:    1) Diarrhea 2) Abdominal pain Prior history of IBS-D which has been otherwise largely well-controlled with fiber supplement and on-demand Imodium over the years, now with relapsing episodic abdominal pain and diarrhea.  Similar symptoms back in August.  ER evaluation last week with CT showing fluid-filled bowel mucosal enhancement of the small bowel, but no colon wall thickening.  Stool studies just returned earlier today and show +toxigenic C. difficile by PCR.  Unclear if this represents true infection vs colonization as she is otherwise feeling better today and recent imaging with small bowel changes rather than colonic wall thickening.  Nonetheless, will elect to treat as follows:  - Vancomycin 125 mg p.o. 4 times daily x 10 days - Need confirmatory testing with GDH antigen test followed by toxin EIA as confirmatory testing - Maintain adequate hydration  3) GERD - Well-controlled on current therapy - Continue Prilosec      [name] ,DO, FACG 04/07/2024, 3:46 PM

## 2024-04-07 NOTE — Patient Instructions (Addendum)
 _______________________________________________________  If your blood pressure at your visit was 140/90 or greater, please contact your primary care physician to follow up on this.  _______________________________________________________  If you are age 77 or older, your body mass index should be between 23-30. Your Body mass index is 29.29 kg/m. If this is out of the aforementioned range listed, please consider follow up with your Primary Care Provider.  If you are age 80 or younger, your body mass index should be between 19-25. Your Body mass index is 29.29 kg/m. If this is out of the aformentioned range listed, please consider follow up with your Primary Care Provider.   ________________________________________________________  The [place] [facility] providers would like to encourage you to use MYCHART to communicate with providers for non-urgent requests or questions.  Due to long hold times on the telephone, sending your provider a message by [facility] may be a faster and more efficient way to get a response.  Please allow 48 business hours for a response.  Please remember that this is for non-urgent requests.  _______________________________________________________  [facility] is using a team-based approach to care.  Your team is made up of your doctor and two to three APPS. Our APPS (Nurse Practitioners and Physician Assistants) work with your physician to ensure care continuity for you. They are fully qualified to address your health concerns and develop a treatment plan. They communicate directly with your gastroenterologist to care for you. Seeing the Advanced Practice Practitioners on your physician's team can help you by facilitating care more promptly, often allowing for earlier appointments, access to diagnostic testing, procedures, and other specialty referrals.   We have sent the following medications to your pharmacy for you to pick up at your convenience:  START:  Vancomycin 125mg  4 times daily for 10 days  It was a pleasure to see you today!  Vito Cirigliano, D.O.

## 2024-04-08 LAB — CALPROTECTIN, FECAL: Calprotectin, Fecal: 13 ug/g (ref 0–120)

## 2024-04-09 LAB — STOOL CULTURE: E coli, Shiga toxin Assay: NEGATIVE

## 2024-04-17 DIAGNOSIS — R059 Cough, unspecified: Secondary | ICD-10-CM | POA: Diagnosis not present

## 2024-04-17 DIAGNOSIS — J209 Acute bronchitis, unspecified: Secondary | ICD-10-CM | POA: Diagnosis not present

## 2024-04-17 DIAGNOSIS — E6609 Other obesity due to excess calories: Secondary | ICD-10-CM | POA: Diagnosis not present

## 2024-04-20 ENCOUNTER — Telehealth: Payer: Self-pay | Admitting: Gastroenterology

## 2024-04-20 DIAGNOSIS — R197 Diarrhea, unspecified: Secondary | ICD-10-CM

## 2024-04-20 DIAGNOSIS — A498 Other bacterial infections of unspecified site: Secondary | ICD-10-CM

## 2024-04-20 NOTE — Telephone Encounter (Signed)
 Patient states she has finished antibiotics but is still experiencing painful diarrhea. Patient is requesting a call to discuss options to help further. Please advise, thank you

## 2024-04-20 NOTE — Telephone Encounter (Signed)
 Called & spoke with patient. Patient reports that she finished Vancomycin prescription on Monday. Patient reports multiple episodes of incontinence since Sunday. Pt reports intermittent abdominal cramping. Patient is tolerating liquids and bland diet. Patient reports 4 episodes of diarrhea already. Patient informed me that she was seen at an urgent care on Monday and was diagnosed with bronchitis. Patient was prescribed an antibiotic and steroid. I informed patient that she may need a prolonged taper since starting antibiotic may have triggered ongoing symptoms. Please advise, thanks.

## 2024-04-21 ENCOUNTER — Other Ambulatory Visit

## 2024-04-21 DIAGNOSIS — A498 Other bacterial infections of unspecified site: Secondary | ICD-10-CM | POA: Diagnosis not present

## 2024-04-21 DIAGNOSIS — R197 Diarrhea, unspecified: Secondary | ICD-10-CM

## 2024-04-21 NOTE — Telephone Encounter (Signed)
 Called and spoke with patient regarding Dr. Rennis recommendations. Patient will stop by the lab today to pick up stool kits. Patient is aware that she will be further advised once results return. Patient mentioned that she had been taking Imodium and I told her to hold at this time until we figure out if she has an active C. Diff infection or just colonization. Patient has been scheduled for a follow up with Deanna May, NP on Mon day, 05/09/24 at 3:20 pm. Patient verbalized understanding and had no concerns at the end of the call.    Stool study orders in epic.

## 2024-04-25 ENCOUNTER — Ambulatory Visit: Payer: Self-pay | Admitting: Gastroenterology

## 2024-04-27 LAB — C. DIFFICILE GDH AND TOXIN A/B
GDH ANTIGEN: NOT DETECTED
MICRO NUMBER:: 17231211
SPECIMEN QUALITY:: ADEQUATE
TOXIN A AND B: NOT DETECTED

## 2024-04-27 LAB — PANCREATIC ELASTASE, FECAL: Pancreatic Elastase-1, Stool: 460 ug/g (ref >200)

## 2024-05-09 ENCOUNTER — Ambulatory Visit: Admitting: Gastroenterology

## 2024-05-09 ENCOUNTER — Ambulatory Visit
Admission: RE | Admit: 2024-05-09 | Discharge: 2024-05-09 | Disposition: A | Source: Ambulatory Visit | Attending: Gastroenterology

## 2024-05-09 ENCOUNTER — Encounter: Payer: Self-pay | Admitting: Gastroenterology

## 2024-05-09 VITALS — BP 118/78 | HR 82 | Ht 63.0 in | Wt 164.1 lb

## 2024-05-09 DIAGNOSIS — Z0389 Encounter for observation for other suspected diseases and conditions ruled out: Secondary | ICD-10-CM | POA: Diagnosis not present

## 2024-05-09 DIAGNOSIS — Z8619 Personal history of other infectious and parasitic diseases: Secondary | ICD-10-CM

## 2024-05-09 DIAGNOSIS — K580 Irritable bowel syndrome with diarrhea: Secondary | ICD-10-CM

## 2024-05-09 DIAGNOSIS — K5289 Other specified noninfective gastroenteritis and colitis: Secondary | ICD-10-CM | POA: Diagnosis not present

## 2024-05-09 NOTE — Patient Instructions (Addendum)
 Diarrhea Recommend low fodmap diet Take dicyclomine  20 mg (2 tablets) 30-45 mins  before meals and at bedtime OTC Imodium as needed - can take 1-2 tablets as needed.    Food triggers- dairy, fructose, and caffeine   Your provider has requested that you have an abdominal x ray before leaving today. Please go to the basement floor to our Radiology department for the test.  _______________________________________________________  If your blood pressure at your visit was 140/90 or greater, please contact your primary care physician to follow up on this.  _______________________________________________________  If you are age 77 or older, your body mass index should be between 23-30. Your Body mass index is 29.07 kg/m. If this is out of the aforementioned range listed, please consider follow up with your Primary Care Provider.  If you are age 72 or younger, your body mass index should be between 19-25. Your Body mass index is 29.07 kg/m. If this is out of the aformentioned range listed, please consider follow up with your Primary Care Provider.   ________________________________________________________  The [place] [facility] providers would like to encourage you to use MYCHART to communicate with providers for non-urgent requests or questions.  Due to long hold times on the telephone, sending your provider a message by [facility] may be a faster and more efficient way to get a response.  Please allow 48 business hours for a response.  Please remember that this is for non-urgent requests.  _______________________________________________________  [facility] is using a team-based approach to care.  Your team is made up of your doctor and two to three APPS. Our APPS (Nurse Practitioners and Physician Assistants) work with your physician to ensure care continuity for you. They are fully qualified to address your health concerns and develop a treatment plan. They communicate directly with your  gastroenterologist to care for you. Seeing the Advanced Practice Practitioners on your physician's team can help you by facilitating care more promptly, often allowing for earlier appointments, access to diagnostic testing, procedures, and other specialty referrals.   Thank you for trusting me with your gastrointestinal care. Deanna May, FNP-C

## 2024-05-09 NOTE — Progress Notes (Signed)
 Chief Complaint: follow-up diarrhea Primary GI Doctor:Dr. San  GI History: Tara Davis is a 78 y.o. female with history of breast cancer diagnosed 02/2022  s/p lumpectomy and now on anastrozole , diabetes, depression, tobacco use, cholecystectomy, follows in GI clinic for the following:  1) IBS-D.  Longstanding history of chronic nonbloody diarrhea.  Symptoms present for many years. Generally well controlled with fiber supplement daily and imodium prn (takes about 3 times/week). This has been evaluated by Dr. Teressa, Dr. Burnette at [facility], and by Dr. Sherell at [facility] in the past.   2) GERD.  Longstanding history of reflux.  Well-controlled on Prilosec 40 mg daily.     Endoscopic History: - 04/2010: EGD: H. pylori gastritis - 11/2016: EGD: Normal   - 10/2009: Colonoscopy: Small adenomas removed, diverticulosis, hemorrhoids - 04/2010: Colonoscopy: 3 small adenomas - 11/2016: Colonoscopy: four small polyps removed from the transverse and sigmoid colon, histology serrated and hyperplastic polyps; random biopsies were normal; left-sided diverticulosis - 10/27/2023: Colonoscopy: 3 subcentimeter polyps (adenoma x 1), left-sided diverticulosis, grade 2 internal hemorrhoids.  Repeat 7 years    Interval History Patent last seen in GI office on 04/07/24 by Dr. San. She completed Vancomycin  125 mg p.o. 4 times daily x 10 days.   Patient presents for follow-up of diarrhea. She reports she will go to the restroom up to 12 times per day. She will got to restroom and initially will have formed stools followed by diarrhea. She only goes a small amount. She had explosive diarrhea today after having tomato soup. She has intermittent urgency. She has been taking otc imodium prn for lower abdominal cramping 1-2 tablets per day. She is taking daily probiotic. She is also taking dicyclomine  TID before meals which she thinks helps.   She has some stress related to still working and her job, but  overall feels her life is not stressful.    Wt Readings from Last 3 Encounters:  05/09/24 164 lb 2 oz (74.4 kg)  04/07/24 165 lb 6 oz (75 kg)  03/29/24 160 lb (72.6 kg)    Past Medical History:  Diagnosis Date   Adenomatous colon polyp    Anxiety    Arthritis    Bilateral cataracts    within 6 mos.   C. difficile diarrhea    Depression    Diverticulosis    Fatty liver    GERD (gastroesophageal reflux disease)    H. pylori infection    Hyperlipidemia    IBS (irritable bowel syndrome)    Ulcer 1988   Uterine fibroid     Past Surgical History:  Procedure Laterality Date   APPENDECTOMY     BREAST LUMPECTOMY WITH RADIOACTIVE SEED LOCALIZATION Left 03/12/2022   Procedure: LEFT BREAST LUMPECTOMY WITH RADIOACTIVE SEED LOCALIZATION;  Surgeon: Aron Shoulders, MD;  Location: [facility] OR;  Service: General;  Laterality: Left;   CATARACT EXTRACTION Bilateral    CATARACT EXTRACTION, BILATERAL     CHOLECYSTECTOMY     COLONOSCOPY     spontaneous vaginal delivery     x2   TONSILLECTOMY     TOTAL KNEE ARTHROPLASTY Left 10/11/2021   Procedure: LEFT TOTAL KNEE ARTHROPLASTY;  Surgeon: Harden Jerona GAILS, MD;  Location: [facility] OR;  Service: Orthopedics;  Laterality: Left;   WISDOM TOOTH EXTRACTION      Current Outpatient Medications  Medication Sig Dispense Refill   anastrozole  (ARIMIDEX ) 1 MG tablet TAKE 1 TABLET BY MOUTH DAILY 30 tablet 5   atorvastatin  (LIPITOR) 80 MG  tablet Take 1 tablet (80 mg total) by mouth daily. 90 tablet 3   Bacillus Coagulans-Inulin (BENEFIBER PREBIOTIC+PROBIOTIC PO) Take 1 Scoop by mouth daily.     Isopropyl Alcohol (ALCOHOL WIPES) 70 % MISC To check blood sugars three times a day Externally three times a day; Duration: 30 days     Lancets (ONETOUCH DELICA PLUS LANCET33G) MISC USE 3 TIMES DAILY TO CHECK BLOOD SUGARS AS DIRECTED; Duration: 33     loperamide (IMODIUM A-D) 2 MG tablet Take 2 mg by mouth as needed for diarrhea or loose stools.     LORazepam  (ATIVAN ) 0.5 MG  tablet Take 1 mg by mouth 2 (two) times daily.     metoprolol  succinate (TOPROL  XL) 25 MG 24 hr tablet Take 1 tablet (25 mg total) by mouth daily. 30 tablet 3   nicotine (NICODERM CQ - DOSED IN MG/24 HOURS) 21 mg/24hr patch Place 21 mg onto the skin daily as needed (nicotine).     nitroGLYCERIN  (NITROSTAT ) 0.4 MG SL tablet Place 1 tablet (0.4 mg total) under the tongue every 5 (five) minutes as needed. 25 tablet 3   omeprazole  (PRILOSEC) 40 MG capsule Take 40 mg by mouth daily.     ONETOUCH VERIO test strip To use daily to three times daily to check blood sugars for 30 days     Semaglutide (RYBELSUS) 3 MG TABS 3 mg.     vortioxetine  HBr (TRINTELLIX ) 20 MG TABS tablet Take 20 mg by mouth daily.     Wheat Dextrin (BENEFIBER) POWD 1 tsp Orally once a day     ondansetron  (ZOFRAN ) 4 MG tablet Take 1 tablet (4 mg total) by mouth every 8 (eight) hours as needed for nausea. (Patient not taking: Reported on 05/09/2024) 8 tablet 5   No current facility-administered medications for this visit.    Allergies as of 05/09/2024 - Review Complete 05/09/2024  Allergen Reaction Noted   Trazodone hcl Hives 10/27/2023   Amoxicillin -pot clavulanate Itching 06/28/2020   Codeine Other (See Comments) 10/29/2009   Penicillins Itching 06/14/2020    Family History  Problem Relation Age of Onset   Alzheimer's disease Mother    Hyperlipidemia Mother    Depression Father    Diabetes Father    Breast cancer Sister 3   Alzheimer's disease Maternal Grandmother    Lung cancer Paternal Grandmother    Colon cancer Neg Hx    Esophageal cancer Neg Hx    Stomach cancer Neg Hx     Review of Systems:    Constitutional: No weight loss, fever, chills, weakness or fatigue HEENT: Eyes: No change in vision               Ears, Nose, Throat:  No change in hearing or congestion Skin: No rash or itching Cardiovascular: No chest pain, chest pressure or palpitations   Respiratory: No SOB or cough Gastrointestinal: See HPI and  otherwise negative Genitourinary: No dysuria or change in urinary frequency Neurological: No headache, dizziness or syncope Musculoskeletal: No new muscle or joint pain Hematologic: No bleeding or bruising Psychiatric: No history of depression or anxiety    Physical Exam:  Vital signs: BP 118/78   Pulse 82   Ht 5' 3 (1.6 m)   Wt 164 lb 2 oz (74.4 kg)   BMI 29.07 kg/m   Constitutional:   Pleasant female appears to be in NAD, Well developed, Well nourished, alert and cooperative Eyes:   PEERL, EOMI. No icterus. Conjunctiva pink. Neck:  Supple Throat: Oral  cavity and pharynx without inflammation, swelling or lesion.  Respiratory: Respirations even and unlabored. Lungs clear to auscultation bilaterally.   No wheezes, crackles, or rhonchi.  Cardiovascular: Normal S1, S2. Regular rate and rhythm. No peripheral edema, cyanosis or pallor.  Gastrointestinal:  Soft, nondistended, nontender. No rebound or guarding. Hypoactive bowel sounds. No appreciable masses or hepatomegaly. Rectal:  Not performed.  Msk:  Symmetrical without gross deformities. Without edema, no deformity or joint abnormality.  Neurologic:  Alert and  oriented x4;  grossly normal neurologically.  Skin:   Dry and intact without significant lesions or rashes.  RELEVANT LABS AND IMAGING: CBC    Latest Ref Rng & Units 03/29/2024    2:25 PM 02/24/2024   11:29 AM 11/04/2023   10:52 AM  CBC  WBC 4.0 - 10.5 K/uL 8.8  9.7  8.3   Hemoglobin 12.0 - 15.0 g/dL 84.6  84.8  85.9   Hematocrit 36.0 - 46.0 % 47.1  44.9  39.8   Platelets 150 - 400 K/uL 288  288  265      CMP     Latest Ref Rng & Units 03/29/2024    2:25 PM 02/24/2024   11:29 AM 11/04/2023   10:52 AM  CMP  Glucose 70 - 99 mg/dL 887  91  869   BUN 8 - 23 mg/dL 15  17  17    Creatinine 0.44 - 1.00 mg/dL 9.18  9.14  9.24   Sodium 135 - 145 mmol/L 140  138  138   Potassium 3.5 - 5.1 mmol/L 3.9  4.8  3.7   Chloride 98 - 111 mmol/L 104  101  106   CO2 22 - 32 mmol/L  22  21  22    Calcium  8.9 - 10.3 mg/dL 9.8  9.8  9.1   Total Protein 6.5 - 8.1 g/dL 7.6   6.7   Total Bilirubin 0.0 - 1.2 mg/dL 0.7   0.9   Alkaline Phos 38 - 126 U/L 91   77   AST 15 - 41 U/L 23   20   ALT 0 - 44 U/L 23   21      Lab Results  Component Value Date   TSH 1.850 04/13/2018  Labs 04/04/2024: - Normal ESR, CRP - C. difficile toxin positive by PCR - Stool culture negative  Labs 04/21/24: --pancreatic elastase 460 --C difficile [facility]DH and Toxin A/B negative  Assessment: Encounter Diagnoses  Name Primary?   History of Clostridium difficile colitis Yes   Irritable bowel syndrome with diarrhea   78 year old female patient with history of chronic diarrhea. Patient treated with 10 day sof vancomycin  for possible cdiff. She had follow-up C. difficile testing which is a little more sensitive and was negative indicating no active infection using the GDH antigen and toxin screen.  Normal elastase indicating no evidence of pancreatic insufficiency. CTAP 10/25 showed small bowel enteritis w/o obstruction. Patient using dicyclomine  and imodium prn which seems to help, but she has difficulty deciding how to titrate them to make sure her symptoms are managed. Recommended dicyclomine  20mg  before meals and at bedtime to prevent nocturnal symptoms and taking imodium 1-2 tabs in am. Will order follow-up xray to rule out obstipation due to her irregular bowels. 10/2023: Colonoscopy: 3 subcentimeter polyps (adenoma x 1), left-sided diverticulosis, grade 2 internal hemorrhoids.  Repeat 7 years.  Plan: -continue dicyclomine  20 mg before meals and at bedtime -continue OTC imodium prn  -continue probiotic po daily -recommend low fodmap  diet -Abdominal xray 2 view -recall colonoscopy 10/2030  Thank you for the courtesy of this consult. Please call me with any questions or concerns.   Jasline Buskirk, FNP-C [facility] 05/09/2024, 3:56 PM  Cc: [name], DO

## 2024-05-10 ENCOUNTER — Ambulatory Visit: Payer: Self-pay | Admitting: Gastroenterology

## 2024-05-11 ENCOUNTER — Ambulatory Visit: Admitting: Adult Health

## 2024-05-11 ENCOUNTER — Other Ambulatory Visit

## 2024-05-12 DIAGNOSIS — F331 Major depressive disorder, recurrent, moderate: Secondary | ICD-10-CM | POA: Diagnosis not present

## 2024-05-16 ENCOUNTER — Other Ambulatory Visit: Payer: Self-pay | Admitting: *Deleted

## 2024-05-16 DIAGNOSIS — Z170 Estrogen receptor positive status [ER+]: Secondary | ICD-10-CM

## 2024-05-17 ENCOUNTER — Inpatient Hospital Stay: Admitting: Adult Health

## 2024-05-17 ENCOUNTER — Inpatient Hospital Stay

## 2024-05-19 ENCOUNTER — Telehealth: Payer: Self-pay | Admitting: Gastroenterology

## 2024-05-19 NOTE — Telephone Encounter (Signed)
 Inbound call from patient stating she would like to speak to nurse in regards to how she's been feeling, patient states she's been having extreme lower abdominal pain and has been taking dicyclomine  but has not taken imodium since she does not have diarrhea its just a strong pain that makes her feel miserable  Requesting a call back  Please advise  Thank you

## 2024-05-20 NOTE — Telephone Encounter (Signed)
 Spoke with patient & she says symptoms have improved. Yesterday she started to have abdominal pain, denies any n/v/bleeding. BM's have been normal, last one today. She stated she has been taking dicyclomine  20 mg in the morning & at bedtime, and yesterday took an additional dose which helped. Advised her that she can take dicyclomine  20 mg up to QID, and to minimize imodium while on this medication. If any further questions/concerns, pt advised to let us  know. Pt verbalized all understanding.

## 2024-05-20 NOTE — Telephone Encounter (Signed)
 Left message for patient to call back

## 2024-05-25 ENCOUNTER — Inpatient Hospital Stay: Admitting: Adult Health

## 2024-05-25 ENCOUNTER — Inpatient Hospital Stay

## 2024-05-25 ENCOUNTER — Encounter: Payer: Self-pay | Admitting: Adult Health

## 2024-05-25 ENCOUNTER — Inpatient Hospital Stay: Attending: Adult Health

## 2024-05-25 VITALS — BP 120/64 | HR 58 | Temp 98.7°F | Resp 18 | Ht 63.0 in | Wt 165.2 lb

## 2024-05-25 DIAGNOSIS — F17210 Nicotine dependence, cigarettes, uncomplicated: Secondary | ICD-10-CM

## 2024-05-25 DIAGNOSIS — C50412 Malignant neoplasm of upper-outer quadrant of left female breast: Secondary | ICD-10-CM

## 2024-05-25 DIAGNOSIS — Z1721 Progesterone receptor positive status: Secondary | ICD-10-CM | POA: Diagnosis not present

## 2024-05-25 DIAGNOSIS — Z79811 Long term (current) use of aromatase inhibitors: Secondary | ICD-10-CM | POA: Diagnosis not present

## 2024-05-25 DIAGNOSIS — Z1732 Human epidermal growth factor receptor 2 negative status: Secondary | ICD-10-CM | POA: Diagnosis not present

## 2024-05-25 DIAGNOSIS — Z170 Estrogen receptor positive status [ER+]: Secondary | ICD-10-CM

## 2024-05-25 LAB — CBC WITH DIFFERENTIAL (CANCER CENTER ONLY)
Abs Immature Granulocytes: 0.03 K/uL (ref 0.00–0.07)
Basophils Absolute: 0 K/uL (ref 0.0–0.1)
Basophils Relative: 1 %
Eosinophils Absolute: 0.1 K/uL (ref 0.0–0.5)
Eosinophils Relative: 1 %
HCT: 42.7 % (ref 36.0–46.0)
Hemoglobin: 14.8 g/dL (ref 12.0–15.0)
Immature Granulocytes: 0 %
Lymphocytes Relative: 31 %
Lymphs Abs: 2.8 K/uL (ref 0.7–4.0)
MCH: 34 pg (ref 26.0–34.0)
MCHC: 34.7 g/dL (ref 30.0–36.0)
MCV: 98.2 fL (ref 80.0–100.0)
Monocytes Absolute: 0.5 K/uL (ref 0.1–1.0)
Monocytes Relative: 6 %
Neutro Abs: 5.4 K/uL (ref 1.7–7.7)
Neutrophils Relative %: 61 %
Platelet Count: 253 K/uL (ref 150–400)
RBC: 4.35 MIL/uL (ref 3.87–5.11)
RDW: 13.6 % (ref 11.5–15.5)
WBC Count: 8.9 K/uL (ref 4.0–10.5)
nRBC: 0 % (ref 0.0–0.2)

## 2024-05-25 LAB — CMP (CANCER CENTER ONLY)
ALT: 18 U/L (ref 0–44)
AST: 21 U/L (ref 15–41)
Albumin: 4.3 g/dL (ref 3.5–5.0)
Alkaline Phosphatase: 84 U/L (ref 38–126)
Anion gap: 11 (ref 5–15)
BUN: 17 mg/dL (ref 8–23)
CO2: 26 mmol/L (ref 22–32)
Calcium: 9.6 mg/dL (ref 8.9–10.3)
Chloride: 104 mmol/L (ref 98–111)
Creatinine: 0.95 mg/dL (ref 0.44–1.00)
GFR, Estimated: >60 mL/min (ref >60)
Glucose, Bld: 99 mg/dL (ref 70–99)
Potassium: 4.4 mmol/L (ref 3.5–5.1)
Sodium: 140 mmol/L (ref 135–145)
Total Bilirubin: 0.4 mg/dL (ref 0.0–1.2)
Total Protein: 6.9 g/dL (ref 6.5–8.1)

## 2024-05-25 NOTE — Progress Notes (Unsigned)
 [facility] Cancer Follow up:    Tara Skates, DO [address] [place] [address]   DIAGNOSIS: Cancer Staging  Malignant neoplasm of upper-outer quadrant of left breast in female, estrogen receptor positive (HCC) Staging form: Breast, AJCC 8th Edition - Clinical stage from 02/11/2022: Stage IA (cT1a, cN0, cM0, G2, ER+, PR+, HER2-) - Signed by Lanny Callander, MD on 02/23/2022 Stage prefix: Initial diagnosis Method of lymph node assessment: Clinical Histologic grading system: 3 grade system - Pathologic stage from 03/12/2022: Stage Unknown (pT1a, pNX, cM0, G2, ER+, PR+, HER2-) - Signed by Lanny Callander, MD on 04/21/2022 Stage prefix: Initial diagnosis Histologic grading system: 3 grade system Residual tumor (R): R0    SUMMARY OF ONCOLOGIC HISTORY: Oncology History Overview Note   Cancer Staging  Malignant neoplasm of upper-outer quadrant of left breast in female, estrogen receptor positive (HCC) Staging form: Breast, AJCC 8th Edition - Clinical stage from 02/11/2022: Stage IA (cT1a, cN0, cM0, G2, ER+, PR+, HER2-) - Signed by Lanny Callander, MD on 02/23/2022 Stage prefix: Initial diagnosis Method of lymph node assessment: Clinical Histologic grading system: 3 grade system - Pathologic stage from 03/12/2022: Stage Unknown (pT1a, pNX, cM0, G2, ER+, PR+, HER2-) - Signed by Lanny Callander, MD on 04/21/2022 Stage prefix: Initial diagnosis Histologic grading system: 3 grade system Residual tumor (R): R0 - None     Malignant neoplasm of upper-outer quadrant of left breast in female, estrogen receptor positive (HCC)  02/11/2022 Initial Biopsy   Diagnosis Breast, left, needle core biopsy, 2 o'clock, 8cmfn - INVASIVE DUCTAL CARCINOMA, SEE NOTE - DUCTAL CARCINOMA IN SITU, INTERMEDIATE GRADE - TUBULE FORMATION: SCORE 3 - NUCLEAR PLEOMORPHISM: SCORE 2 - MITOTIC COUNT: SCORE 1 - TOTAL SCORE: 6 - OVERALL GRADE: 2 - LYMPHOVASCULAR INVASION: NOT IDENTIFIED - CANCER LENGTH: 0.3 CM -  CALCIFICATIONS: NOT IDENTIFIED - OTHER FINDINGS: NONE   02/11/2022 Cancer Staging   Staging form: Breast, AJCC 8th Edition - Clinical stage from 02/11/2022: Stage IA (cT1a, cN0, cM0, G2, ER+, PR+, HER2-) - Signed by Lanny Callander, MD on 02/23/2022 Stage prefix: Initial diagnosis Method of lymph node assessment: Clinical Histologic grading system: 3 grade system   02/17/2022 Initial Diagnosis   Malignant neoplasm of upper-outer quadrant of left breast in female, estrogen receptor positive (HCC)    Genetic Testing   [facility] was Negative. Report date is 03/01/2022.  The CancerNext gene panel offered by [employer] includes sequencing, rearrangement analysis, and RNA analysis for the following 36 genes:   APC, ATM, AXIN2, BARD1, BMPR1A, BRCA1, BRCA2, BRIP1, CDH1, CDK4, CDKN2A, CHEK2, DICER1, HOXB13, EPCAM, GREM1, MLH1, MSH2, MSH3, MSH6, MUTYH, NBN, NF1, NTHL1, PALB2, PMS2, POLD1, POLE, PTEN, RAD51C, RAD51D, RECQL, SMAD4, SMARCA4, STK11, and TP53.    03/12/2022 Cancer Staging   Staging form: Breast, AJCC 8th Edition - Pathologic stage from 03/12/2022: Stage Unknown (pT1a, pNX, cM0, G2, ER+, PR+, HER2-) - Signed by Lanny Callander, MD on 04/21/2022 Stage prefix: Initial diagnosis Histologic grading system: 3 grade system Residual tumor (R): R0 - None   03/12/2022 Surgery   S/p left lumpectomy by Dr. Aron, path showed 5mm IDC and DCIS.    04/2022 -  Anti-estrogen oral therapy   Anastrozole      CURRENT THERAPY:  INTERVAL HISTORY:  Discussed the use of AI scribe software for clinical note transcription with the patient, who gave verbal consent to proceed.  History of Present Illness Tara Davis is a 78 year old female with stage 1A ER/PR-positive left breast invasive ductal carcinoma, status  post lumpectomy and on antiestrogen therapy, who presents for oncology follow-up and surveillance.  She was diagnosed with stage 1A ER/PR-positive left breast invasive ductal carcinoma in September  2023 and had a lumpectomy followed by daily antiestrogen therapy, which she tolerates without adverse effects. Her mammogram on February 19, 2024, showed no evidence of malignancy with breast density category B. She denies breast pain, new masses, nipple discharge, or other symptoms of recurrence.  Bone health is monitored due to antiestrogen therapy. Bone density on February 17, 2023, was normal with a lowest T score of -0.1 at the left femoral neck. She is concerned about osteoporosis but has no history of fractures or bone pain.  She continues to smoke with a 60 pack-year history and is on annual lung cancer screening. CT on Oct 28, 2023, showed Lung-RADS 2 pulmonary nodules with recommendation for continued annual surveillance. She has no cough, shortness of breath, or chest pain.  She recently had an unremarkable cardiac evaluation for presumed angina. She reports hearing loss with recommendation for hearing aids, which she has not obtained due to cost. She describes her stomach as a mess without specifying gastrointestinal symptoms.     Patient Active Problem List   Diagnosis Date Noted   Precordial pain 02/24/2024   Elevated coronary artery calcium  score 02/24/2024   Mixed hyperlipidemia 02/24/2024   Hardening of the aorta (main artery of the heart) 07/21/2022   BMI 36.0-36.9,adult 07/21/2022   Moderate major depression (HCC) 07/21/2022   Severe obesity (HCC) 07/21/2022   Genetic testing 03/04/2022   Family history of breast cancer 02/19/2022   Malignant neoplasm of upper-outer quadrant of left breast in female, estrogen receptor positive (HCC) 02/17/2022   Total knee replacement status, left 10/11/2021   Unilateral primary osteoarthritis, left knee    History of colonic polyps 08/07/2020   Gastroesophageal reflux disease 08/23/2019   Tobacco abuse 01/12/2018   Cigarette smoker 01/12/2018   IBS 07/05/2008    is allergic to trazodone hcl, amoxicillin -pot clavulanate,  codeine, and penicillins.  MEDICAL HISTORY: Past Medical History:  Diagnosis Date   Adenomatous colon polyp    Anxiety    Arthritis    Bilateral cataracts    within 6 mos.   C. difficile diarrhea    Depression    Diverticulosis    Fatty liver    GERD (gastroesophageal reflux disease)    H. pylori infection    Hyperlipidemia    IBS (irritable bowel syndrome)    Ulcer 1988   Uterine fibroid     SURGICAL HISTORY: Past Surgical History:  Procedure Laterality Date   APPENDECTOMY     BREAST LUMPECTOMY WITH RADIOACTIVE SEED LOCALIZATION Left 03/12/2022   Procedure: LEFT BREAST LUMPECTOMY WITH RADIOACTIVE SEED LOCALIZATION;  Surgeon: Aron Shoulders, MD;  Location: [facility] OR;  Service: General;  Laterality: Left;   CATARACT EXTRACTION Bilateral    CATARACT EXTRACTION, BILATERAL     CHOLECYSTECTOMY     COLONOSCOPY     spontaneous vaginal delivery     x2   TONSILLECTOMY     TOTAL KNEE ARTHROPLASTY Left 10/11/2021   Procedure: LEFT TOTAL KNEE ARTHROPLASTY;  Surgeon: Harden Jerona GAILS, MD;  Location: [facility] OR;  Service: Orthopedics;  Laterality: Left;   WISDOM TOOTH EXTRACTION      SOCIAL HISTORY: Social History   Socioeconomic History   Marital status: Single    Spouse name: Not on file   Number of children: 2   Years of education: 16   Highest education level:  Bachelor's degree (e.g., BA, AB, BS)  Occupational History   Occupation: self employed  Tobacco Use   Smoking status: Every Day    Current packs/day: 1.00    Average packs/day: 1 pack/day for 60.0 years (60.0 ttl pk-yrs)    Types: Cigarettes   Smokeless tobacco: Never  Vaping Use   Vaping status: Never Used  Substance and Sexual Activity   Alcohol use: Yes    Alcohol/week: 10.0 standard drinks of alcohol    Types: 10 Shots of liquor per week    Comment: social   Drug use: No   Sexual activity: Not Currently  Other Topics Concern   Not on file  Social History Narrative   Patient is right-handed. She lives with her  long time boyfriend of 25+ years ina 2 level home. She drinks green tea, 2-4 cups a day. She does not exercise.   Social Drivers of Health   Tobacco Use: High Risk (04/07/2024)   Patient History    Smoking Tobacco Use: Every Day    Smokeless Tobacco Use: Never    Passive Exposure: Not on file  Financial Resource Strain: Not on file  Food Insecurity: Not on file  Transportation Needs: Not on file  Physical Activity: Not on file  Stress: Not on file  Social Connections: Not on file  Intimate Partner Violence: Not on file  Depression (PHQ2-9): Medium Risk (10/03/2021)   Depression (PHQ2-9)    PHQ-2 Score: 9  Alcohol Screen: Not on file  Housing: Not on file  Utilities: Not on file  Health Literacy: Not on file    FAMILY HISTORY: Family History  Problem Relation Age of Onset   Alzheimer's disease Mother    Hyperlipidemia Mother    Depression Father    Diabetes Father    Breast cancer Sister 64   Alzheimer's disease Maternal Grandmother    Lung cancer Paternal Grandmother    Colon cancer Neg Hx    Esophageal cancer Neg Hx    Stomach cancer Neg Hx     Review of Systems - Oncology    PHYSICAL EXAMINATION   Onc Performance Status - 05/25/24 1000       KPS SCALE   KPS % SCORE Able to carry on normal activity, minor s/s of disease          Vitals:   05/25/24 1042  BP: 120/64  Pulse: (!) 58  Resp: 18  Temp: 98.7 F (37.1 C)  SpO2: 99%    Physical Exam  LABORATORY DATA:  CBC    Component Value Date/Time   WBC 8.9 05/25/2024 0942   WBC 8.8 03/29/2024 1425   RBC 4.35 05/25/2024 0942   HGB 14.8 05/25/2024 0942   HGB 15.1 02/24/2024 1129   HCT 42.7 05/25/2024 0942   HCT 44.9 02/24/2024 1129   PLT 253 05/25/2024 0942   PLT 288 02/24/2024 1129   MCV 98.2 05/25/2024 0942   MCV 100 (H) 02/24/2024 1129   MCH 34.0 05/25/2024 0942   MCHC 34.7 05/25/2024 0942   RDW 13.6 05/25/2024 0942   RDW 12.5 02/24/2024 1129   LYMPHSABS 2.8 05/25/2024 0942    LYMPHSABS 2.1 12/07/2018 0922   MONOABS 0.5 05/25/2024 0942   EOSABS 0.1 05/25/2024 0942   EOSABS 0.2 12/07/2018 0922   BASOSABS 0.0 05/25/2024 0942   BASOSABS 0.1 12/07/2018 0922    CMP     Component Value Date/Time   NA 140 05/25/2024 0942   NA 138 02/24/2024 1129   K  4.4 05/25/2024 0942   CL 104 05/25/2024 0942   CO2 26 05/25/2024 0942   GLUCOSE 99 05/25/2024 0942   BUN 17 05/25/2024 0942   BUN 17 02/24/2024 1129   CREATININE 0.95 05/25/2024 0942   CREATININE 0.96 12/17/2014 1542   CALCIUM  9.6 05/25/2024 0942   PROT 6.9 05/25/2024 0942   PROT 6.5 10/03/2021 1551   ALBUMIN  4.3 05/25/2024 0942   ALBUMIN  4.4 10/03/2021 1551   AST 21 05/25/2024 0942   ALT 18 05/25/2024 0942   ALKPHOS 84 05/25/2024 0942   BILITOT 0.4 05/25/2024 0942   GFRNONAA >60 05/25/2024 0942   GFRAA 73 12/07/2018 0922     ASSESSMENT and THERAPY PLAN:   No problem-specific Assessment & Plan notes found for this encounter.   Assessment and Plan Assessment & Plan Stage IA ER/PR-positive invasive ductal carcinoma of the left breast, status post lumpectomy, on antiestrogen therapy Surveillance for early-stage, hormone receptor-positive breast carcinoma post-lumpectomy. No recurrence on recent mammogram. Normal bone density. No new oncologic concerns. - Ordered bone density testing for September 2026, orders faxed to [facility]. - Continue annual mammography surveillance. - Continue antiestrogen therapy. - Scheduled follow-up in 6 months for laboratory evaluation and annual oncology visit.  Nicotine dependence, cigarettes Continued tobacco use increases risk for malignancy and comorbidities. - Encouraged healthy lifestyle, including regular exercise and increased intake of fruits and vegetables.  Pulmonary nodules under surveillance Stable pulmonary nodules on annual screening, categorized as Lung-RADS 2, consistent with benign findings. Ongoing surveillance needed due to tobacco use and risk profile. -  Ordered continued annual lung cancer screening. - Discussed need for ongoing surveillance to monitor stability of pulmonary nodules.      All questions were answered. The patient knows to call the clinic with any problems, questions or concerns. We can certainly see the patient much sooner if necessary.  Total encounter time:*** minutes*in face-to-face visit time, chart review, lab review, care coordination, order entry, and documentation of the encounter time.    Morna Kendall, NP 05/25/2024 11:12 AM Medical Oncology and Hematology [facility] [address] [place], [place] [address] Tel. [phone]    Fax. [phone]  *Total Encounter Time as defined by the Centers for Medicare and Medicaid Services includes, in addition to the face-to-face time of a patient visit (documented in the note above) non-face-to-face time: obtaining and reviewing outside history, ordering and reviewing medications, tests or procedures, care coordination (communications with other health care professionals or caregivers) and documentation in the medical record.

## 2024-05-27 ENCOUNTER — Encounter: Payer: Self-pay | Admitting: *Deleted

## 2024-05-27 ENCOUNTER — Telehealth: Payer: Self-pay | Admitting: Gastroenterology

## 2024-05-27 NOTE — Telephone Encounter (Signed)
 Unable to reach patient.  Left a second message requesting return phone call.

## 2024-05-27 NOTE — Telephone Encounter (Signed)
 Attempted patient x 2.  Straight to VM.  Advised I would try back again in a few minutes or she could call us  back.

## 2024-05-27 NOTE — Telephone Encounter (Signed)
 Inbound call from patient stating that she would like a call back from [facility] because this she has had abdominal , vomiting and diarrhea. Requesting a call back today. Please advise.

## 2024-05-27 NOTE — Telephone Encounter (Signed)
 Patient's phone had a block.  Have tried all day to get in touch. Patient is having severe abdominal cramps despite taking Dicyclomine  (2) TID.  She has chronic diarrhea.  She tries to avoid Imodium at all cost due to severe constipation. Patient  asking if there is anything else she can take for the cramping and also have something for nausea.  Frequently vomits bile Q am, today vomited undigested food from the night before. Has resumed eating small amounts with no more vomiting but still has nausea. Please advise.

## 2024-05-27 NOTE — Telephone Encounter (Signed)
 Patient returning call Requesting a call back  Please advise  Thank you

## 2024-05-27 NOTE — Telephone Encounter (Signed)
 Patient's phone still blocked Sent mychart message advised that calls now go to on-call Hung/Mann. Advised patient she could call our main number and ask that on-call MD caontact her.  Also sent patient step by step how to unblock her phone.

## 2024-05-30 ENCOUNTER — Encounter: Payer: Self-pay | Admitting: *Deleted

## 2024-05-30 ENCOUNTER — Telehealth: Payer: Self-pay | Admitting: Gastroenterology

## 2024-05-30 NOTE — Telephone Encounter (Signed)
 This is a patient of Deanna May and Dr San.  Tara Davis is here today to field this message, and she can contact today's DOD if needed.  - H. Legrand, MD

## 2024-05-30 NOTE — Telephone Encounter (Signed)
 Patient sent mychart message stating she was feeling better and did not need to be seen.  She will be in touch if she starts to have issues again.

## 2024-05-30 NOTE — Telephone Encounter (Signed)
 Attempted patient. Straight to voicemail.  Sent patient a MyChart message stating this and that Tara Davis had an opening tomorrow, but I would need her to either call me or message me to confirm.

## 2024-05-30 NOTE — Telephone Encounter (Signed)
 Attempted to call both numbers on file, no answer LVM

## 2024-06-10 ENCOUNTER — Telehealth: Payer: Self-pay | Admitting: Gastroenterology

## 2024-06-10 ENCOUNTER — Other Ambulatory Visit: Payer: Self-pay | Admitting: Gastroenterology

## 2024-06-10 ENCOUNTER — Other Ambulatory Visit: Payer: Self-pay

## 2024-06-10 DIAGNOSIS — R110 Nausea: Secondary | ICD-10-CM

## 2024-06-10 DIAGNOSIS — R197 Diarrhea, unspecified: Secondary | ICD-10-CM

## 2024-06-10 DIAGNOSIS — R109 Unspecified abdominal pain: Secondary | ICD-10-CM

## 2024-06-10 DIAGNOSIS — R112 Nausea with vomiting, unspecified: Secondary | ICD-10-CM

## 2024-06-10 MED ORDER — ONDANSETRON HCL 4 MG PO TABS: 4.0000 mg | ORAL_TABLET | Freq: Two times a day (BID) | ORAL | 0 refills | Status: AC | PRN

## 2024-06-10 NOTE — Telephone Encounter (Signed)
 Incoming call from pt stating she is experiencing lower abdominal pain, nausea and diarrhea. Pt requesting advice for care. Please advise thank you.

## 2024-06-10 NOTE — Telephone Encounter (Signed)
 Discussed NP recommendations with patient. She will come by to complete stool kit at next earliest convenience.

## 2024-06-10 NOTE — Telephone Encounter (Signed)
 Returned call to patient & she started to have lower abdominal pain (6/10) that radiates down her legs, n/v, and explosive diarrhea that started yesterday. Pain continues to be constant, however n/v has resolved and she's only had one episode of diarrhea today. She's taking 20 mg of dicyclomine  BID & half of an imodium in the AM. Recent history of Cdiff. Will send to provider to see if any further testing is needed.

## 2024-06-13 ENCOUNTER — Telehealth: Payer: Self-pay | Admitting: Gastroenterology

## 2024-06-13 ENCOUNTER — Other Ambulatory Visit: Payer: Self-pay | Admitting: Cardiology

## 2024-06-13 NOTE — Telephone Encounter (Signed)
 Pt stated that she started the Zofran  on Friday in which she felt that it helped her symptoms for a couple of days until today . Pt stated that she does not have the nausea although she has constipation and abdominal pain. Last BM on Friday.  Pt stated that she takes 1/2 imodium daily. Pt chart was reviewed and noted multiple my chart messages and recent office visit. Pt was notified that the provider is recommending a stool sample to be completed.  Pt is requesting that the provider review her med list and see if there is 1 particular medication that is noted to have diarrhea. Pt stated that she has reviewed all of her medications that she takes and stated that they all state to have the side effect of diarrhea.  Please review and advise

## 2024-06-13 NOTE — Telephone Encounter (Signed)
 Inbound call from patient stating today she has been having abdominal pain and constipation. State she would like to discuss medications. Patient is requesting a call back. Please advise, thank you

## 2024-06-14 ENCOUNTER — Other Ambulatory Visit

## 2024-06-14 DIAGNOSIS — R197 Diarrhea, unspecified: Secondary | ICD-10-CM

## 2024-06-14 DIAGNOSIS — R109 Unspecified abdominal pain: Secondary | ICD-10-CM

## 2024-06-14 DIAGNOSIS — R112 Nausea with vomiting, unspecified: Secondary | ICD-10-CM

## 2024-06-14 NOTE — Telephone Encounter (Signed)
 Patient returning call Requesting a call back  Please advise  Thank you

## 2024-06-14 NOTE — Telephone Encounter (Signed)
"  Left message for pt to call back   "

## 2024-06-14 NOTE — Telephone Encounter (Signed)
 Pt made aware of Deanna May NP recommendations: Pt was scheduled to see Dr. San on 06/22/2024 at 10:40 AM. Pt made aware.  Pt verbalized understanding with all questions answered.

## 2024-06-16 ENCOUNTER — Telehealth: Payer: Self-pay | Admitting: Gastroenterology

## 2024-06-16 ENCOUNTER — Ambulatory Visit: Payer: Self-pay | Admitting: Gastroenterology

## 2024-06-16 DIAGNOSIS — A498 Other bacterial infections of unspecified site: Secondary | ICD-10-CM

## 2024-06-16 LAB — GI PROFILE, STOOL, PCR

## 2024-06-16 MED ORDER — FIDAXOMICIN 200 MG PO TABS: 200.0000 mg | ORAL_TABLET | Freq: Two times a day (BID) | ORAL | 0 refills | Status: AC

## 2024-06-16 NOTE — Telephone Encounter (Addendum)
 Oncall Note  Patient called to report that Dificid  is not covered by her insurance. Informed patient that I will send message to [facility] May to obtain PA tomorrow AM. If not will need extended taper after 14 day course of PO Vancomycin .  K. Veena Preston Garabedian , MD [phone]

## 2024-06-17 ENCOUNTER — Telehealth: Payer: Self-pay

## 2024-06-17 ENCOUNTER — Other Ambulatory Visit (HOSPITAL_COMMUNITY): Payer: Self-pay

## 2024-06-17 NOTE — Telephone Encounter (Signed)
 PT returning call to further discuss PA for medication. Please advise

## 2024-06-17 NOTE — Telephone Encounter (Signed)
 Pharmacy Patient Advocate Encounter   Dificid  [employer] Assistance Papers were filled out and faxed 06-18-2023.  Received call that patient is approved and will be in effect 06-17-2024 to 06-08-2025.  Medication to be set up for expedited delivery.   Prescription sent to [facility] for filling and shipping.   Patient should received medication 06-18-2024 provided delivery provider does Saturday shipping.

## 2024-06-17 NOTE — Telephone Encounter (Signed)
 No prior authorization is required for this. I have submitted [employer] Assistance papers and am awaiting further determination. I will keep you updated.

## 2024-06-17 NOTE — Telephone Encounter (Signed)
 Pt made aware of update on PA & advised we will let her know an update once we hear back.

## 2024-06-17 NOTE — Telephone Encounter (Signed)
 Pt made aware.

## 2024-06-22 ENCOUNTER — Encounter: Payer: Self-pay | Admitting: Gastroenterology

## 2024-06-22 ENCOUNTER — Ambulatory Visit: Admitting: Gastroenterology

## 2024-06-22 VITALS — BP 126/70 | HR 65 | Ht 63.0 in | Wt 160.0 lb

## 2024-06-22 DIAGNOSIS — R109 Unspecified abdominal pain: Secondary | ICD-10-CM | POA: Diagnosis not present

## 2024-06-22 DIAGNOSIS — Z8619 Personal history of other infectious and parasitic diseases: Secondary | ICD-10-CM

## 2024-06-22 DIAGNOSIS — A0472 Enterocolitis due to Clostridium difficile, not specified as recurrent: Secondary | ICD-10-CM

## 2024-06-22 DIAGNOSIS — K580 Irritable bowel syndrome with diarrhea: Secondary | ICD-10-CM | POA: Diagnosis not present

## 2024-06-22 DIAGNOSIS — R110 Nausea: Secondary | ICD-10-CM | POA: Diagnosis not present

## 2024-06-22 DIAGNOSIS — R194 Change in bowel habit: Secondary | ICD-10-CM

## 2024-06-22 MED ORDER — ONDANSETRON HCL 4 MG PO TABS: 4.0000 mg | ORAL_TABLET | Freq: Four times a day (QID) | ORAL | 1 refills | Status: AC | PRN

## 2024-06-22 MED ORDER — DICYCLOMINE HCL 10 MG PO CAPS: 10.0000 mg | ORAL_CAPSULE | Freq: Four times a day (QID) | ORAL | 5 refills | Status: AC | PRN

## 2024-06-22 NOTE — Patient Instructions (Addendum)
 _______________________________________________________  If your blood pressure at your visit was 140/90 or greater, please contact your primary care physician to follow up on this.  _______________________________________________________  If you are age 78 or older, your body mass index should be between 23-30. Your Body mass index is 28.34 kg/m. If this is out of the aforementioned range listed, please consider follow up with your Primary Care Provider.  If you are age 66 or younger, your body mass index should be between 19-25. Your Body mass index is 28.34 kg/m. If this is out of the aformentioned range listed, please consider follow up with your Primary Care Provider.   ________________________________________________________  The [facility] providers would like to encourage you to use MYCHART to communicate with providers for non-urgent requests or questions.  Due to long hold times on the telephone, sending your provider a message by [facility] may be a faster and more efficient way to get a response.  Please allow 48 business hours for a response.  Please remember that this is for non-urgent requests.  _______________________________________________________  [facility] is using a team-based approach to care.  Your team is made up of your doctor and two to three APPS. Our APPS (Nurse Practitioners and Physician Assistants) work with your physician to ensure care continuity for you. They are fully qualified to address your health concerns and develop a treatment plan. They communicate directly with your gastroenterologist to care for you. Seeing the Advanced Practice Practitioners on your physician's team can help you by facilitating care more promptly, often allowing for earlier appointments, access to diagnostic testing, procedures, and other specialty referrals.   We have sent the following medications to your pharmacy for you to pick up at your  convenience: Zofran  Bentyl   Continue antibiotics  Please follow up in 6 months. Give us  a call at [phone] to schedule an appointment. Call in May for a July appointment  It was a pleasure to see you today!  Vito Cirigliano, D.O.

## 2024-06-22 NOTE — Progress Notes (Signed)
 "  Chief Complaint:    Diarrhea, abdominal cramping  GI History: Tara Davis is a 79 y.o. female with history of breast cancer diagnosed 02/2022  s/p lumpectomy and now on anastrozole , diabetes, depression, tobacco use, cholecystectomy, follows in GI clinic for the following:   1) IBS-D.  Longstanding history of chronic nonbloody diarrhea.  Symptoms present for many years. Generally well controlled with fiber supplement daily and imodium prn (takes about 3 times/week). This has been evaluated by Dr. Teressa, Dr. Burnette at [facility], and by Dr. Sherell at [facility] in the past.   2) GERD.  Longstanding history of reflux.  Well-controlled on Prilosec 40 mg daily.  3) C. difficile infection.  Diagnosed in 03/2024, treated with vancomycin  x 10 days.  Follow-up testing with [facility] antigen and toxin screen was negative.  Otherwise normal pancreatic elastase, ESR, CRP, calprotectin     Endoscopic History: - 04/2010: EGD: H. pylori gastritis - 11/2016: EGD: Normal   - 10/2009: Colonoscopy: Small adenomas removed, diverticulosis, hemorrhoids - 04/2010: Colonoscopy: 3 small adenomas - 11/2016: Colonoscopy: four small polyps removed from the transverse and sigmoid colon, histology serrated and hyperplastic polyps; random biopsies were normal; left-sided diverticulosis - 10/27/2023: Colonoscopy: 3 subcentimeter polyps (adenoma x 1), left-sided diverticulosis, grade 2 internal hemorrhoids.  Repeat 7 years  HPI:     Patient is a 79 y.o. female presenting to the Gastroenterology Clinic for follow-up.  Was last seen by Deanna May on 05/09/2024 for follow-up of C. difficile with diarrhea.  Reports symptoms initially abated with vancomycin , but started to recur late November and at the time of her OV was having up to 12 loose, nonbloody BMs/day despite completion of vancomycin .   Was using OTC Imodium, probiotic, along with dicyclomine  for abdominal cramping.  Had recommended adjusting dicyclomine  to 20 mg ACHS along  with Imodium 1-2 tabs in the morning as needed.  Abdominal x-ray was unremarkable.  Reviewed labs from 05/2024: Normal CBC and CMP.  GI PCR panel obtained last week and shows C. difficile toxin A/B detected.  Remainder of the panel was negative.  She was provided with a course of Dificid  x 10 days due to continued diarrhea.  Started taking Dificid  on 06/19/2024.  Today, she states symptoms are starting to improve.  She does have a longstanding history of chronic diarrhea, previously diagnosed as IBS-D.  She thinks symptoms ultimately started after her cholecystectomy which was many years ago.  May have tried cholestyramine  powder at some point in time but could not tolerate.  Requesting refill of dicyclomine  for abdominal cramping and history of IBS-D.  Med list recently reviewed for potential culprit agents: Arimidex , Rybelsus, Trintellix , omeprazole  all with potential diarrhea as ADR.  None of these are new.  Review of systems:     No chest pain, no SOB, no fevers, no urinary sx   Past Medical History:  Diagnosis Date   Adenomatous colon polyp    Anxiety    Arthritis    Bilateral cataracts    within 6 mos.   C. difficile diarrhea    Depression    Diverticulosis    Fatty liver    GERD (gastroesophageal reflux disease)    H. pylori infection    Hyperlipidemia    IBS (irritable bowel syndrome)    Ulcer 1988   Uterine fibroid     Patient's surgical history, family medical history, social history, medications and allergies were all reviewed in Epic    Current Outpatient Medications  Medication  Sig Dispense Refill   anastrozole  (ARIMIDEX ) 1 MG tablet TAKE 1 TABLET BY MOUTH DAILY 30 tablet 5   atorvastatin  (LIPITOR) 80 MG tablet Take 1 tablet (80 mg total) by mouth daily. 90 tablet 3   Bacillus Coagulans-Inulin (BENEFIBER PREBIOTIC+PROBIOTIC PO) Take 1 Scoop by mouth daily.     fidaxomicin  (DIFICID ) 200 MG TABS tablet Take 1 tablet (200 mg total) by mouth 2 (two) times daily.  20 tablet 0   Isopropyl Alcohol (ALCOHOL WIPES) 70 % MISC To check blood sugars three times a day Externally three times a day; Duration: 30 days     Lancets (ONETOUCH DELICA PLUS LANCET33G) MISC USE 3 TIMES DAILY TO CHECK BLOOD SUGARS AS DIRECTED; Duration: 33     loperamide (IMODIUM A-D) 2 MG tablet Take 2 mg by mouth as needed for diarrhea or loose stools.     LORazepam  (ATIVAN ) 0.5 MG tablet Take 1 mg by mouth 2 (two) times daily.     metoprolol  succinate (TOPROL -XL) 25 MG 24 hr tablet TAKE 1 TABLET BY MOUTH DAILY 90 tablet 2   nicotine (NICODERM CQ - DOSED IN MG/24 HOURS) 21 mg/24hr patch Place 21 mg onto the skin daily as needed (nicotine).     nitroGLYCERIN  (NITROSTAT ) 0.4 MG SL tablet Place 1 tablet (0.4 mg total) under the tongue every 5 (five) minutes as needed. 25 tablet 3   omeprazole  (PRILOSEC) 40 MG capsule Take 40 mg by mouth daily.     ondansetron  (ZOFRAN ) 4 MG tablet Take 1 tablet (4 mg total) by mouth every 12 (twelve) hours as needed for nausea or vomiting. 20 tablet 0   ONETOUCH VERIO test strip To use daily to three times daily to check blood sugars for 30 days     Semaglutide (RYBELSUS) 3 MG TABS 3 mg.     vortioxetine  HBr (TRINTELLIX ) 20 MG TABS tablet Take 20 mg by mouth daily.     Wheat Dextrin (BENEFIBER) POWD 1 tsp Orally once a day     No current facility-administered medications for this visit.    Physical Exam:     There were no vitals taken for this visit.  GENERAL:  Pleasant female in NAD PSYCH: : Cooperative, normal affect EENT:  conjunctiva pink, mucous membranes moist, neck supple without masses CARDIAC:  RRR, no murmur heard, no peripheral edema PULM: Normal respiratory effort, lungs CTA bilaterally, no wheezing ABDOMEN:  Nondistended, soft, nontender. No obvious masses, no hepatomegaly,  normal bowel sounds SKIN:  turgor, no lesions seen Musculoskeletal:  Normal muscle tone, normal strength NEURO: Alert and oriented x 3, no focal neurologic  deficits   IMPRESSION and PLAN:    1) Change in bowel habits 2) History of C. difficile infection 79 year old female with a longer standing history of chronic diarrhea, likely combination of IBS-D and bile acid diarrhea, then developed acute exacerbation in autumn 2025, was diagnosed with C. difficile and treated with vancomycin .  Initially had good response to vancomycin , and interestingly, GDH testing was negative after vancomycin  was completed, so not entirely clear if this was colonization vs true infection with eradication.  Nonetheless, developed recurrence of symptoms and C. difficile toxin A/B was positive, so she was started on Dificid .  Currently taking and reports overall improvement.  We discussed diagnosis of C. difficile at length today, to include cautious interpretation of labs with plan for the following:  - Resume Dificid  until completion of course - Continue probiotics for at least 4 weeks beyond completion of Dificid  course -  If concern for recurrence, recommend [facility] testing (high negative predictive value) with reflex EIA testing (higher specificity) if positive.  Depending on results, can also combine with third testing (NAAT confirmatory testing)  3) IBS-D 4) Abdominal cramping As above, suspect longstanding history of underlying IBS-D and likely a component of bile salt diarrhea.  As above, discussed the possibility of postinfectious IBS, exacerbation of IBS, etc. after infection and will monitor closely - Refill placed for dicyclomine  (Bentyl ) 10 mg every 6 hours as needed for abdominal cramping, #60, RF 5 - Increase Benefiber - Maintain adequate hydration with at least 64 ounces of water daily - Will potentially start on colestipol tablets pending response to the above.  Thinks she has tried cholestyramine  in the distant past and did not tolerate powder form  5) Nausea Occasional nausea without emesis.  Responds well to Zofran .  Requesting refill today. - Refill  placed for Zofran  4 mg every 6 hours as needed for nausea/vomiting, #30, RF1  RTC in 6 months or sooner as needed  I spent 35 minutes of time, including in depth chart review, independent review of results as outlined above, communicating results with the patient directly, face-to-face time with the patient, coordinating care, and ordering studies and medications as appropriate, and documentation.        [name] ,DO, FACG 06/22/2024, 10:39 AM  "

## 2024-07-14 ENCOUNTER — Other Ambulatory Visit: Payer: Self-pay

## 2024-07-14 ENCOUNTER — Telehealth: Payer: Self-pay | Admitting: Gastroenterology

## 2024-07-14 NOTE — Telephone Encounter (Signed)
 PT is calling to update us  that she had C. Diff that she resolved with two rounds of medication and it is now back again. Her pharmacy recommended vancomycin  and metronitrazole be prescribed together to treat this. Requesting to speak with a nurse further.

## 2024-07-14 NOTE — Telephone Encounter (Signed)
 Returned call to patient & she is concerned that cdiff has returned. Previously has been treated in the past with vancomycin  & dificid . States she visited [place]  for 10 days recently & experienced 3 episodes of explosive diarrhea. Last bm was soft today. She is complaining of abdominal cramping, currently taking dicyclomine  twice in the morning & twice in the evening. Imodium PRN. An episode of vomiting once this morning. Pt inquiring if she should be treated with flagyl & vanc per pharmacy recommendations. Advised her I'd reach out to Dr. San & will be back in touch once he's reviewed.

## 2024-07-15 ENCOUNTER — Other Ambulatory Visit

## 2024-07-15 ENCOUNTER — Other Ambulatory Visit: Payer: Self-pay

## 2024-07-15 DIAGNOSIS — R109 Unspecified abdominal pain: Secondary | ICD-10-CM

## 2024-07-15 DIAGNOSIS — R197 Diarrhea, unspecified: Secondary | ICD-10-CM

## 2024-07-15 NOTE — Telephone Encounter (Signed)
 Spoke with Dr. San & order has been updated to the cdiff [facility] only. Order placed. Left message for patient to call back.

## 2024-07-15 NOTE — Telephone Encounter (Signed)
 "  Patient made aware.  "

## 2024-08-23 ENCOUNTER — Ambulatory Visit: Admitting: Behavioral Health

## 2024-11-24 ENCOUNTER — Inpatient Hospital Stay

## 2024-11-24 ENCOUNTER — Inpatient Hospital Stay: Admitting: Hematology

## 2025-05-25 ENCOUNTER — Inpatient Hospital Stay: Admitting: Adult Health

## 2025-05-25 ENCOUNTER — Inpatient Hospital Stay
